# Patient Record
Sex: Female | Born: 1945 | Race: White | Hispanic: Yes | Smoking: Former smoker
Health system: Southern US, Community
[De-identification: ages and names within clinical notes are randomized; demographics above are authoritative.]

## PROBLEM LIST (undated history)

## (undated) DIAGNOSIS — K219 Gastro-esophageal reflux disease without esophagitis: Secondary | ICD-10-CM

## (undated) DIAGNOSIS — F419 Anxiety disorder, unspecified: Secondary | ICD-10-CM

## (undated) DIAGNOSIS — T7840XA Allergy, unspecified, initial encounter: Secondary | ICD-10-CM

## (undated) DIAGNOSIS — E785 Hyperlipidemia, unspecified: Secondary | ICD-10-CM

## (undated) DIAGNOSIS — M1990 Unspecified osteoarthritis, unspecified site: Secondary | ICD-10-CM

## (undated) DIAGNOSIS — R7303 Prediabetes: Secondary | ICD-10-CM

## (undated) DIAGNOSIS — F32A Depression, unspecified: Secondary | ICD-10-CM

## (undated) DIAGNOSIS — F329 Major depressive disorder, single episode, unspecified: Secondary | ICD-10-CM

## (undated) DIAGNOSIS — G4730 Sleep apnea, unspecified: Secondary | ICD-10-CM

## (undated) DIAGNOSIS — E119 Type 2 diabetes mellitus without complications: Secondary | ICD-10-CM

## (undated) DIAGNOSIS — H269 Unspecified cataract: Secondary | ICD-10-CM

## (undated) HISTORY — PX: FEMUR FRACTURE SURGERY: SHX633

## (undated) HISTORY — PX: DIAGNOSTIC LAPAROSCOPY: SUR761

## (undated) HISTORY — PX: LIPOMA EXCISION: SHX5283

## (undated) HISTORY — DX: Type 2 diabetes mellitus without complications: E11.9

## (undated) HISTORY — PX: JOINT REPLACEMENT: SHX530

## (undated) HISTORY — PX: FRACTURE SURGERY: SHX138

## (undated) HISTORY — PX: EYE SURGERY: SHX253

## (undated) HISTORY — DX: Unspecified cataract: H26.9

## (undated) HISTORY — DX: Prediabetes: R73.03

## (undated) HISTORY — PX: UMBILICAL HERNIA REPAIR: SUR1181

## (undated) HISTORY — PX: KNEE ARTHROPLASTY: SHX992

## (undated) HISTORY — DX: Unspecified osteoarthritis, unspecified site: M19.90

## (undated) HISTORY — PX: HERNIA REPAIR: SHX51

## (undated) HISTORY — DX: Allergy, unspecified, initial encounter: T78.40XA

## (undated) HISTORY — PX: TOTAL HIP ARTHROPLASTY: SHX124

## (undated) HISTORY — PX: CATARACT EXTRACTION, BILATERAL: SHX1313

---
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
## 2014-03-25 DIAGNOSIS — F320 Major depressive disorder, single episode, mild: Secondary | ICD-10-CM | POA: Diagnosis not present

## 2014-03-25 DIAGNOSIS — E780 Pure hypercholesterolemia: Secondary | ICD-10-CM | POA: Diagnosis not present

## 2014-03-25 DIAGNOSIS — R7301 Impaired fasting glucose: Secondary | ICD-10-CM | POA: Diagnosis not present

## 2014-03-25 DIAGNOSIS — G4733 Obstructive sleep apnea (adult) (pediatric): Secondary | ICD-10-CM | POA: Diagnosis not present

## 2014-05-17 DIAGNOSIS — H43813 Vitreous degeneration, bilateral: Secondary | ICD-10-CM | POA: Diagnosis not present

## 2014-05-17 DIAGNOSIS — H5201 Hypermetropia, right eye: Secondary | ICD-10-CM | POA: Diagnosis not present

## 2014-06-28 DIAGNOSIS — F320 Major depressive disorder, single episode, mild: Secondary | ICD-10-CM | POA: Diagnosis not present

## 2014-06-28 DIAGNOSIS — G4733 Obstructive sleep apnea (adult) (pediatric): Secondary | ICD-10-CM | POA: Diagnosis not present

## 2014-06-28 DIAGNOSIS — E780 Pure hypercholesterolemia: Secondary | ICD-10-CM | POA: Diagnosis not present

## 2014-06-28 DIAGNOSIS — R7301 Impaired fasting glucose: Secondary | ICD-10-CM | POA: Diagnosis not present

## 2014-10-07 DIAGNOSIS — R7301 Impaired fasting glucose: Secondary | ICD-10-CM | POA: Diagnosis not present

## 2014-10-07 DIAGNOSIS — G4733 Obstructive sleep apnea (adult) (pediatric): Secondary | ICD-10-CM | POA: Diagnosis not present

## 2014-10-07 DIAGNOSIS — E663 Overweight: Secondary | ICD-10-CM | POA: Diagnosis not present

## 2014-10-07 DIAGNOSIS — F320 Major depressive disorder, single episode, mild: Secondary | ICD-10-CM | POA: Diagnosis not present

## 2014-10-07 DIAGNOSIS — E780 Pure hypercholesterolemia: Secondary | ICD-10-CM | POA: Diagnosis not present

## 2015-01-07 DIAGNOSIS — Z1239 Encounter for other screening for malignant neoplasm of breast: Secondary | ICD-10-CM | POA: Diagnosis not present

## 2015-01-07 DIAGNOSIS — G4733 Obstructive sleep apnea (adult) (pediatric): Secondary | ICD-10-CM | POA: Diagnosis not present

## 2015-01-07 DIAGNOSIS — R7301 Impaired fasting glucose: Secondary | ICD-10-CM | POA: Diagnosis not present

## 2015-01-07 DIAGNOSIS — E782 Mixed hyperlipidemia: Secondary | ICD-10-CM | POA: Diagnosis not present

## 2015-01-07 DIAGNOSIS — F320 Major depressive disorder, single episode, mild: Secondary | ICD-10-CM | POA: Diagnosis not present

## 2015-02-04 DIAGNOSIS — G4733 Obstructive sleep apnea (adult) (pediatric): Secondary | ICD-10-CM | POA: Diagnosis not present

## 2015-04-11 DIAGNOSIS — I10 Essential (primary) hypertension: Secondary | ICD-10-CM | POA: Diagnosis not present

## 2015-04-11 DIAGNOSIS — R7301 Impaired fasting glucose: Secondary | ICD-10-CM | POA: Diagnosis not present

## 2015-04-11 DIAGNOSIS — E782 Mixed hyperlipidemia: Secondary | ICD-10-CM | POA: Diagnosis not present

## 2015-04-11 DIAGNOSIS — G4733 Obstructive sleep apnea (adult) (pediatric): Secondary | ICD-10-CM | POA: Diagnosis not present

## 2015-05-11 DIAGNOSIS — G4733 Obstructive sleep apnea (adult) (pediatric): Secondary | ICD-10-CM | POA: Diagnosis not present

## 2015-05-24 DIAGNOSIS — L03032 Cellulitis of left toe: Secondary | ICD-10-CM | POA: Diagnosis not present

## 2015-07-11 DIAGNOSIS — R7301 Impaired fasting glucose: Secondary | ICD-10-CM | POA: Diagnosis not present

## 2015-07-11 DIAGNOSIS — R202 Paresthesia of skin: Secondary | ICD-10-CM | POA: Diagnosis not present

## 2015-07-11 DIAGNOSIS — G4733 Obstructive sleep apnea (adult) (pediatric): Secondary | ICD-10-CM | POA: Diagnosis not present

## 2015-07-11 DIAGNOSIS — E782 Mixed hyperlipidemia: Secondary | ICD-10-CM | POA: Diagnosis not present

## 2015-08-29 DIAGNOSIS — Z0001 Encounter for general adult medical examination with abnormal findings: Secondary | ICD-10-CM | POA: Diagnosis not present

## 2015-08-29 DIAGNOSIS — Z1211 Encounter for screening for malignant neoplasm of colon: Secondary | ICD-10-CM | POA: Diagnosis not present

## 2015-08-29 DIAGNOSIS — Z1231 Encounter for screening mammogram for malignant neoplasm of breast: Secondary | ICD-10-CM | POA: Diagnosis not present

## 2015-08-29 DIAGNOSIS — Z13820 Encounter for screening for osteoporosis: Secondary | ICD-10-CM | POA: Diagnosis not present

## 2015-08-29 DIAGNOSIS — N3000 Acute cystitis without hematuria: Secondary | ICD-10-CM | POA: Diagnosis not present

## 2015-08-29 DIAGNOSIS — N3001 Acute cystitis with hematuria: Secondary | ICD-10-CM | POA: Diagnosis not present

## 2015-09-12 DIAGNOSIS — N3001 Acute cystitis with hematuria: Secondary | ICD-10-CM | POA: Diagnosis not present

## 2015-09-22 DIAGNOSIS — G4733 Obstructive sleep apnea (adult) (pediatric): Secondary | ICD-10-CM | POA: Diagnosis not present

## 2015-10-18 DIAGNOSIS — Z23 Encounter for immunization: Secondary | ICD-10-CM | POA: Diagnosis not present

## 2015-10-18 DIAGNOSIS — G4733 Obstructive sleep apnea (adult) (pediatric): Secondary | ICD-10-CM | POA: Diagnosis not present

## 2015-10-18 DIAGNOSIS — L700 Acne vulgaris: Secondary | ICD-10-CM | POA: Diagnosis not present

## 2015-10-18 DIAGNOSIS — E782 Mixed hyperlipidemia: Secondary | ICD-10-CM | POA: Diagnosis not present

## 2015-10-18 DIAGNOSIS — R7301 Impaired fasting glucose: Secondary | ICD-10-CM | POA: Diagnosis not present

## 2015-11-28 DIAGNOSIS — M85832 Other specified disorders of bone density and structure, left forearm: Secondary | ICD-10-CM | POA: Diagnosis not present

## 2015-11-28 DIAGNOSIS — Z1231 Encounter for screening mammogram for malignant neoplasm of breast: Secondary | ICD-10-CM | POA: Diagnosis not present

## 2015-11-28 DIAGNOSIS — Z13820 Encounter for screening for osteoporosis: Secondary | ICD-10-CM | POA: Diagnosis not present

## 2015-11-28 DIAGNOSIS — N959 Unspecified menopausal and perimenopausal disorder: Secondary | ICD-10-CM | POA: Diagnosis not present

## 2015-12-28 DIAGNOSIS — G4733 Obstructive sleep apnea (adult) (pediatric): Secondary | ICD-10-CM | POA: Diagnosis not present

## 2016-01-20 DIAGNOSIS — E782 Mixed hyperlipidemia: Secondary | ICD-10-CM | POA: Diagnosis not present

## 2016-01-20 DIAGNOSIS — G4733 Obstructive sleep apnea (adult) (pediatric): Secondary | ICD-10-CM | POA: Diagnosis not present

## 2016-01-20 DIAGNOSIS — R7301 Impaired fasting glucose: Secondary | ICD-10-CM | POA: Diagnosis not present

## 2016-04-03 DIAGNOSIS — G4733 Obstructive sleep apnea (adult) (pediatric): Secondary | ICD-10-CM | POA: Diagnosis not present

## 2016-04-25 DIAGNOSIS — R7301 Impaired fasting glucose: Secondary | ICD-10-CM | POA: Diagnosis not present

## 2016-04-25 DIAGNOSIS — G4733 Obstructive sleep apnea (adult) (pediatric): Secondary | ICD-10-CM | POA: Diagnosis not present

## 2016-04-25 DIAGNOSIS — E782 Mixed hyperlipidemia: Secondary | ICD-10-CM | POA: Diagnosis not present

## 2016-04-30 DIAGNOSIS — L0202 Furuncle of face: Secondary | ICD-10-CM | POA: Diagnosis not present

## 2016-05-09 DIAGNOSIS — L710 Perioral dermatitis: Secondary | ICD-10-CM | POA: Diagnosis not present

## 2016-06-19 DIAGNOSIS — L710 Perioral dermatitis: Secondary | ICD-10-CM | POA: Diagnosis not present

## 2016-07-12 DIAGNOSIS — G4733 Obstructive sleep apnea (adult) (pediatric): Secondary | ICD-10-CM | POA: Diagnosis not present

## 2016-07-26 DIAGNOSIS — R7301 Impaired fasting glucose: Secondary | ICD-10-CM | POA: Diagnosis not present

## 2016-07-26 DIAGNOSIS — G4733 Obstructive sleep apnea (adult) (pediatric): Secondary | ICD-10-CM | POA: Diagnosis not present

## 2016-07-26 DIAGNOSIS — E782 Mixed hyperlipidemia: Secondary | ICD-10-CM | POA: Diagnosis not present

## 2016-09-04 DIAGNOSIS — R590 Localized enlarged lymph nodes: Secondary | ICD-10-CM | POA: Diagnosis not present

## 2016-09-04 DIAGNOSIS — Z0001 Encounter for general adult medical examination with abnormal findings: Secondary | ICD-10-CM | POA: Diagnosis not present

## 2016-10-18 DIAGNOSIS — G4733 Obstructive sleep apnea (adult) (pediatric): Secondary | ICD-10-CM | POA: Diagnosis not present

## 2017-01-11 DIAGNOSIS — E782 Mixed hyperlipidemia: Secondary | ICD-10-CM | POA: Diagnosis not present

## 2017-01-11 DIAGNOSIS — R7301 Impaired fasting glucose: Secondary | ICD-10-CM | POA: Diagnosis not present

## 2017-01-11 DIAGNOSIS — G4733 Obstructive sleep apnea (adult) (pediatric): Secondary | ICD-10-CM | POA: Diagnosis not present

## 2017-01-11 DIAGNOSIS — Z23 Encounter for immunization: Secondary | ICD-10-CM | POA: Diagnosis not present

## 2017-01-22 DIAGNOSIS — G4733 Obstructive sleep apnea (adult) (pediatric): Secondary | ICD-10-CM | POA: Diagnosis not present

## 2017-04-16 DIAGNOSIS — E782 Mixed hyperlipidemia: Secondary | ICD-10-CM | POA: Diagnosis not present

## 2017-04-16 DIAGNOSIS — G4733 Obstructive sleep apnea (adult) (pediatric): Secondary | ICD-10-CM | POA: Diagnosis not present

## 2017-04-16 DIAGNOSIS — R7301 Impaired fasting glucose: Secondary | ICD-10-CM | POA: Diagnosis not present

## 2017-05-02 DIAGNOSIS — G4733 Obstructive sleep apnea (adult) (pediatric): Secondary | ICD-10-CM | POA: Diagnosis not present

## 2017-07-19 DIAGNOSIS — R7301 Impaired fasting glucose: Secondary | ICD-10-CM | POA: Diagnosis not present

## 2017-07-19 DIAGNOSIS — G4733 Obstructive sleep apnea (adult) (pediatric): Secondary | ICD-10-CM | POA: Diagnosis not present

## 2017-07-19 DIAGNOSIS — M25541 Pain in joints of right hand: Secondary | ICD-10-CM | POA: Diagnosis not present

## 2017-07-19 DIAGNOSIS — M25542 Pain in joints of left hand: Secondary | ICD-10-CM | POA: Diagnosis not present

## 2017-07-19 DIAGNOSIS — E782 Mixed hyperlipidemia: Secondary | ICD-10-CM | POA: Diagnosis not present

## 2017-07-31 DIAGNOSIS — G4733 Obstructive sleep apnea (adult) (pediatric): Secondary | ICD-10-CM | POA: Diagnosis not present

## 2017-10-29 DIAGNOSIS — G4733 Obstructive sleep apnea (adult) (pediatric): Secondary | ICD-10-CM | POA: Diagnosis not present

## 2017-11-01 DIAGNOSIS — R7301 Impaired fasting glucose: Secondary | ICD-10-CM | POA: Diagnosis not present

## 2017-11-01 DIAGNOSIS — G4733 Obstructive sleep apnea (adult) (pediatric): Secondary | ICD-10-CM | POA: Diagnosis not present

## 2017-11-01 DIAGNOSIS — E782 Mixed hyperlipidemia: Secondary | ICD-10-CM | POA: Diagnosis not present

## 2017-11-18 DIAGNOSIS — N958 Other specified menopausal and perimenopausal disorders: Secondary | ICD-10-CM | POA: Diagnosis not present

## 2017-11-18 DIAGNOSIS — Z1231 Encounter for screening mammogram for malignant neoplasm of breast: Secondary | ICD-10-CM | POA: Diagnosis not present

## 2017-11-18 DIAGNOSIS — Z0000 Encounter for general adult medical examination without abnormal findings: Secondary | ICD-10-CM | POA: Diagnosis not present

## 2017-11-28 DIAGNOSIS — G4733 Obstructive sleep apnea (adult) (pediatric): Secondary | ICD-10-CM | POA: Diagnosis not present

## 2017-12-30 DIAGNOSIS — G4733 Obstructive sleep apnea (adult) (pediatric): Secondary | ICD-10-CM | POA: Diagnosis not present

## 2018-01-31 DIAGNOSIS — G4733 Obstructive sleep apnea (adult) (pediatric): Secondary | ICD-10-CM | POA: Diagnosis not present

## 2018-02-06 DIAGNOSIS — R7301 Impaired fasting glucose: Secondary | ICD-10-CM | POA: Diagnosis not present

## 2018-02-06 DIAGNOSIS — G4733 Obstructive sleep apnea (adult) (pediatric): Secondary | ICD-10-CM | POA: Diagnosis not present

## 2018-02-06 DIAGNOSIS — L650 Telogen effluvium: Secondary | ICD-10-CM | POA: Diagnosis not present

## 2018-02-06 DIAGNOSIS — E782 Mixed hyperlipidemia: Secondary | ICD-10-CM | POA: Diagnosis not present

## 2018-02-06 DIAGNOSIS — L304 Erythema intertrigo: Secondary | ICD-10-CM | POA: Diagnosis not present

## 2018-03-11 DIAGNOSIS — L03011 Cellulitis of right finger: Secondary | ICD-10-CM | POA: Diagnosis not present

## 2018-04-02 DIAGNOSIS — G4733 Obstructive sleep apnea (adult) (pediatric): Secondary | ICD-10-CM | POA: Diagnosis not present

## 2018-05-02 DIAGNOSIS — G4733 Obstructive sleep apnea (adult) (pediatric): Secondary | ICD-10-CM | POA: Diagnosis not present

## 2018-06-02 DIAGNOSIS — G4733 Obstructive sleep apnea (adult) (pediatric): Secondary | ICD-10-CM | POA: Diagnosis not present

## 2018-06-11 DIAGNOSIS — G4733 Obstructive sleep apnea (adult) (pediatric): Secondary | ICD-10-CM | POA: Diagnosis not present

## 2018-06-11 DIAGNOSIS — E782 Mixed hyperlipidemia: Secondary | ICD-10-CM | POA: Diagnosis not present

## 2018-06-11 DIAGNOSIS — R7301 Impaired fasting glucose: Secondary | ICD-10-CM | POA: Diagnosis not present

## 2018-07-02 DIAGNOSIS — G4733 Obstructive sleep apnea (adult) (pediatric): Secondary | ICD-10-CM | POA: Diagnosis not present

## 2018-12-05 DIAGNOSIS — M25511 Pain in right shoulder: Secondary | ICD-10-CM | POA: Diagnosis not present

## 2018-12-05 DIAGNOSIS — S069X9A Unspecified intracranial injury with loss of consciousness of unspecified duration, initial encounter: Secondary | ICD-10-CM | POA: Diagnosis not present

## 2018-12-05 DIAGNOSIS — S199XXA Unspecified injury of neck, initial encounter: Secondary | ICD-10-CM | POA: Diagnosis not present

## 2018-12-05 DIAGNOSIS — S4991XA Unspecified injury of right shoulder and upper arm, initial encounter: Secondary | ICD-10-CM | POA: Diagnosis not present

## 2018-12-05 DIAGNOSIS — S01111A Laceration without foreign body of right eyelid and periocular area, initial encounter: Secondary | ICD-10-CM | POA: Diagnosis not present

## 2018-12-05 DIAGNOSIS — S0231XA Fracture of orbital floor, right side, initial encounter for closed fracture: Secondary | ICD-10-CM | POA: Diagnosis not present

## 2018-12-08 DIAGNOSIS — S0231XA Fracture of orbital floor, right side, initial encounter for closed fracture: Secondary | ICD-10-CM | POA: Diagnosis present

## 2018-12-09 DIAGNOSIS — S0231XA Fracture of orbital floor, right side, initial encounter for closed fracture: Secondary | ICD-10-CM | POA: Diagnosis not present

## 2018-12-11 DIAGNOSIS — H25041 Posterior subcapsular polar age-related cataract, right eye: Secondary | ICD-10-CM | POA: Diagnosis not present

## 2018-12-11 NOTE — H&P (Signed)
Subjective:     Patient ID: Connie Marks is a 73 y.o. female.  HPI  Referred from [facility] ED following fall DOI 10.30.20. Maxillofacial CT as below. Uses glasses for both near and far, states last eye exam over 2 years ago. Prior to injury felt she needed new Rx. Reports shadows and blurry vision since accident, not definite diplopia. No pain with eye movements. Reports pre injury astigmatism.  CLINICAL DATA: 73 year old female with history of trauma from a fall with injury to the right-side of the head with laceration around the right eye. Loss of consciousness.  EXAM: CT MAXILLOFACIAL WITHOUT CONTRAST  TECHNIQUE: Multidetector CT imaging of the head, cervical spine, and maxillofacial structures were performed using the standard protocol without intravenous contrast. Multiplanar CT image reconstructions of the cervical spine and maxillofacial structures were also generated.  COMPARISON: No priors.  CT MAXILLOFACIAL FINDINGS  Osseous: Displaced fracture of the floor of the right orbit with downward displacement of orbital fat and the inferior rectus muscle, without frank entrapment. Pterygoid plates are intact. Mandible is intact. Mandibular condyles are located bilaterally. No other acute displaced facial bone fractures are noted.  Orbits: Downward herniation of orbital fat and inferior rectus muscle into the superior aspect of the right maxillary sinus. Right globe appears grossly intact. Left orbit is normal in appearance.  Sinuses: High attenuation material lying dependently in the right maxillary sinus, compatible with hemosinus.  Soft tissues: Small amount of gas in the right frontal scalp where there is also some soft tissue swelling, suggesting a laceration. Small amount of high attenuation soft tissue swelling overlying the right maxilla, also suggestive of a contusion.  Other: None.  IMPRESSION: 1. Acute displaced fracture of the right orbital  floor with herniation of orbital fat and downward displacement of the inferior rectus muscle, without frank entrapment. There is also a small amount of hemosinus in the right maxillary sinus. 2. No acute displaced skull fracture or signs of significant acute intracranial trauma. 3. No evidence of significant acute traumatic injury to the cervical spine. 4. 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 5. Multilevel degenerative disc disease and cervical spondylosis, as above.   Electronically Signed By: Vinnie Langton M.D. 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  Musculoskeletal: Positive for arthralgias and myalgias.  Neurological: Positive for dizziness and light-headedness.  Psychiatric/Behavioral: The patient is nervous/anxious.    Remainder 12 point review negative    Objective:   Physical Exam  Constitutional: She is oriented to person, place, and time.  Cardiovascular: Normal rate, regular rhythm and normal heart sounds.  Pulmonary/Chest: Effort normal and breath sounds normal.  Neurological: She is alert and oriented to person, place, and time.  HEENT: right peri orbital and conjunctival hemorrhage, pupils 4 to 2 mm bilateral, EOMI Able to distract lower lid from globe> 4 mm With hand held Snellen chart, acuity with glasses OS 20/50 OD 20/400 Diminished sensation subjective over right V2 distribution    Assessment:     Closed orbital floor fracture right    Plan:     CT personally reviewed. Orbital floor blow out fracture without entrapment. Given size of defect recommend open treatment with implant to prevent late enophthalmos, diplopia. Her visual exam is poor- asked her to have complete ophthalmologic exam prior to surgery. She will arrange this herself ideally this week with surgery tentatively some time next week. Reviewed trans  conjunctival vs lower lid incision, permanent plate placement in floor. Reviewed risks bleeding, blindness, lower lid malposition, need for additional surgery. Reviewed use of Frost stitch, overnight hospital stay for visual cheeks/monitoring for bleeding.   Hold ASA.   Discussed risk COVID infectionthrough this elective surgery. Patient will receive COVID testing prior to surgery. Discussed even if patient receivesa negative test result, the tests in some cases may fail to detect the virus or patient maycontract COVID after the test.COVID 19 infectionbefore/during/aftersurgery may result in lead to a higher chance of complication and death.  Glenna Fellows, MD [facility] Plastic [facility] Re[facility] Surgery

## 2018-12-12 ENCOUNTER — Other Ambulatory Visit: Payer: Self-pay

## 2018-12-12 ENCOUNTER — Encounter (HOSPITAL_BASED_OUTPATIENT_CLINIC_OR_DEPARTMENT_OTHER): Payer: Self-pay

## 2018-12-16 ENCOUNTER — Other Ambulatory Visit (HOSPITAL_COMMUNITY)
Admission: RE | Admit: 2018-12-16 | Discharge: 2018-12-16 | Disposition: A | Discharge status: Home / Self Care | Payer: Medicare Other | Source: Ambulatory Visit | Attending: Plastic Surgery | Admitting: Plastic Surgery

## 2018-12-16 DIAGNOSIS — Z01812 Encounter for preprocedural laboratory examination: Secondary | ICD-10-CM | POA: Insufficient documentation

## 2018-12-16 DIAGNOSIS — Z20828 Contact with and (suspected) exposure to other viral communicable diseases: Secondary | ICD-10-CM | POA: Diagnosis not present

## 2018-12-18 LAB — NOVEL CORONAVIRUS, NAA (HOSP ORDER, SEND-OUT TO REF LAB; TAT 18-24 HRS): SARS-CoV-2, NAA: NOT DETECTED

## 2018-12-18 NOTE — Anesthesia Preprocedure Evaluation (Addendum)
Anesthesia Evaluation  Patient identified by MRN, date of birth, ID band Patient awake    Reviewed: Allergy & Precautions, NPO status , Patient's Chart, lab work & pertinent test results  History of Anesthesia Complications Negative for: history of anesthetic complications  Airway Mallampati: II  TM Distance: >3 FB Neck ROM: Full    Dental  (+) Dental Advisory Given, Teeth Intact   Pulmonary sleep apnea and Continuous Positive Airway Pressure Ventilation , former smoker,    Pulmonary exam normal        Cardiovascular negative cardio ROS Normal cardiovascular exam     Neuro/Psych PSYCHIATRIC DISORDERS Anxiety Depression negative neurological ROS     GI/Hepatic Neg liver ROS, GERD  Medicated and Controlled,  Endo/Other   Obesity   Renal/GU negative Renal ROS     Musculoskeletal negative musculoskeletal ROS (+)   Abdominal   Peds  Hematology negative hematology ROS (+)   Anesthesia Other Findings   Reproductive/Obstetrics                            Anesthesia Physical Anesthesia Plan  ASA: II  Anesthesia Plan: General   Post-op Pain Management:    Induction: Intravenous  PONV Risk Score and Plan: 4 or greater and Treatment may vary due to age or medical condition, Ondansetron and Dexamethasone  Airway Management Planned: Oral ETT  Additional Equipment: None  Intra-op Plan:   Post-operative Plan: Extubation in OR  Informed Consent: I have reviewed the patients History and Physical, chart, labs and discussed the procedure including the risks, benefits and alternatives for the proposed anesthesia with the patient or authorized representative who has indicated his/her understanding and acceptance.     Dental advisory given  Plan Discussed with: CRNA and Anesthesiologist  Anesthesia Plan Comments:        Anesthesia Quick Evaluation

## 2018-12-19 ENCOUNTER — Ambulatory Visit (HOSPITAL_BASED_OUTPATIENT_CLINIC_OR_DEPARTMENT_OTHER): Payer: Medicare Other | Admitting: Anesthesiology

## 2018-12-19 ENCOUNTER — Encounter (HOSPITAL_BASED_OUTPATIENT_CLINIC_OR_DEPARTMENT_OTHER): Payer: Self-pay | Admitting: *Deleted

## 2018-12-19 ENCOUNTER — Ambulatory Visit (HOSPITAL_BASED_OUTPATIENT_CLINIC_OR_DEPARTMENT_OTHER)
Admission: RE | Admit: 2018-12-19 | Discharge: 2018-12-20 | Disposition: A | Discharge status: Home / Self Care | Payer: Medicare Other | Attending: Plastic Surgery | Admitting: Plastic Surgery

## 2018-12-19 ENCOUNTER — Encounter (HOSPITAL_BASED_OUTPATIENT_CLINIC_OR_DEPARTMENT_OTHER)
Admission: RE | Disposition: A | Discharge status: Home / Self Care | Payer: Self-pay | Source: Home / Self Care | Attending: Plastic Surgery

## 2018-12-19 ENCOUNTER — Other Ambulatory Visit: Payer: Self-pay

## 2018-12-19 DIAGNOSIS — W19XXXA Unspecified fall, initial encounter: Secondary | ICD-10-CM | POA: Diagnosis not present

## 2018-12-19 DIAGNOSIS — E785 Hyperlipidemia, unspecified: Secondary | ICD-10-CM | POA: Diagnosis not present

## 2018-12-19 DIAGNOSIS — Z9989 Dependence on other enabling machines and devices: Secondary | ICD-10-CM | POA: Diagnosis not present

## 2018-12-19 DIAGNOSIS — K219 Gastro-esophageal reflux disease without esophagitis: Secondary | ICD-10-CM | POA: Diagnosis not present

## 2018-12-19 DIAGNOSIS — Z87891 Personal history of nicotine dependence: Secondary | ICD-10-CM | POA: Insufficient documentation

## 2018-12-19 DIAGNOSIS — G4730 Sleep apnea, unspecified: Secondary | ICD-10-CM | POA: Insufficient documentation

## 2018-12-19 DIAGNOSIS — Z6834 Body mass index (BMI) 34.0-34.9, adult: Secondary | ICD-10-CM | POA: Insufficient documentation

## 2018-12-19 DIAGNOSIS — E669 Obesity, unspecified: Secondary | ICD-10-CM | POA: Diagnosis not present

## 2018-12-19 DIAGNOSIS — H532 Diplopia: Secondary | ICD-10-CM | POA: Insufficient documentation

## 2018-12-19 DIAGNOSIS — S0231XA Fracture of orbital floor, right side, initial encounter for closed fracture: Secondary | ICD-10-CM | POA: Diagnosis not present

## 2018-12-19 HISTORY — PX: ORIF ORBITAL FRACTURE: SHX5312

## 2018-12-19 HISTORY — DX: Gastro-esophageal reflux disease without esophagitis: K21.9

## 2018-12-19 HISTORY — DX: Sleep apnea, unspecified: G47.30

## 2018-12-19 HISTORY — DX: Hyperlipidemia, unspecified: E78.5

## 2018-12-19 HISTORY — DX: Anxiety disorder, unspecified: F41.9

## 2018-12-19 HISTORY — DX: Depression, unspecified: F32.A

## 2018-12-19 SURGERY — OPEN REDUCTION INTERNAL FIXATION (ORIF) ORBITAL FRACTURE
Anesthesia: General | Site: Eye | Laterality: Right

## 2018-12-19 MED ORDER — CEFAZOLIN SODIUM-DEXTROSE 2-4 GM/100ML-% IV SOLN
INTRAVENOUS | Status: AC
  Filled 2018-12-19: qty 100

## 2018-12-19 MED ORDER — EPHEDRINE SULFATE-NACL 50-0.9 MG/10ML-% IV SOSY
PREFILLED_SYRINGE | INTRAVENOUS | Status: DC | PRN
  Administered 2018-12-19 (×2): 5 mg via INTRAVENOUS

## 2018-12-19 MED ORDER — OXYMETAZOLINE HCL 0.05 % NA SOLN
NASAL | Status: AC
  Filled 2018-12-19: qty 30

## 2018-12-19 MED ORDER — OXYCODONE HCL 5 MG/5ML PO SOLN: 5.0000 mg | Freq: Once | ORAL | Status: AC | PRN

## 2018-12-19 MED ORDER — ONDANSETRON HCL 4 MG/2ML IJ SOLN: 4.0000 mg | Freq: Four times a day (QID) | INTRAMUSCULAR | Status: DC | PRN

## 2018-12-19 MED ORDER — MIDAZOLAM HCL 5 MG/5ML IJ SOLN
INTRAMUSCULAR | Status: DC | PRN
  Administered 2018-12-19: 1 mg via INTRAVENOUS

## 2018-12-19 MED ORDER — TOBRAMYCIN-DEXAMETHASONE 0.3-0.1 % OP OINT
TOPICAL_OINTMENT | OPHTHALMIC | Status: DC | PRN
  Administered 2018-12-19: 1 via OPHTHALMIC

## 2018-12-19 MED ORDER — LIDOCAINE-EPINEPHRINE 1 %-1:100000 IJ SOLN
INTRAMUSCULAR | Status: AC
  Filled 2018-12-19: qty 1

## 2018-12-19 MED ORDER — ROCURONIUM BROMIDE 10 MG/ML (PF) SYRINGE
PREFILLED_SYRINGE | INTRAVENOUS | Status: AC
  Filled 2018-12-19: qty 10

## 2018-12-19 MED ORDER — FENTANYL CITRATE (PF) 100 MCG/2ML IJ SOLN
INTRAMUSCULAR | Status: AC
  Filled 2018-12-19: qty 2

## 2018-12-19 MED ORDER — ROCURONIUM BROMIDE 100 MG/10ML IV SOLN
INTRAVENOUS | Status: DC | PRN
  Administered 2018-12-19: 50 mg via INTRAVENOUS

## 2018-12-19 MED ORDER — PRAMIPEXOLE DIHYDROCHLORIDE 0.25 MG PO TABS
0.5000 mg | ORAL_TABLET | Freq: Three times a day (TID) | ORAL | Status: DC
  Administered 2018-12-19: 0.5 mg via ORAL
  Filled 2018-12-19 (×2): qty 2

## 2018-12-19 MED ORDER — KCL IN DEXTROSE-NACL 20-5-0.45 MEQ/L-%-% IV SOLN
INTRAVENOUS | Status: DC
  Administered 2018-12-19: 10:00:00 via INTRAVENOUS
  Filled 2018-12-19: qty 1000

## 2018-12-19 MED ORDER — PROPOFOL 10 MG/ML IV BOLUS
INTRAVENOUS | Status: AC
  Filled 2018-12-19: qty 20

## 2018-12-19 MED ORDER — LIDOCAINE 2% (20 MG/ML) 5 ML SYRINGE
INTRAMUSCULAR | Status: DC | PRN
  Administered 2018-12-19: 60 mg via INTRAVENOUS

## 2018-12-19 MED ORDER — MIDAZOLAM HCL 2 MG/2ML IJ SOLN: 1.0000 mg | INTRAMUSCULAR | Status: DC | PRN

## 2018-12-19 MED ORDER — LACTATED RINGERS IV SOLN
INTRAVENOUS | Status: DC
  Administered 2018-12-19: 07:00:00 via INTRAVENOUS

## 2018-12-19 MED ORDER — DEXAMETHASONE SODIUM PHOSPHATE 10 MG/ML IJ SOLN
INTRAMUSCULAR | Status: DC | PRN
  Administered 2018-12-19: 10 mg via INTRAVENOUS

## 2018-12-19 MED ORDER — PANTOPRAZOLE SODIUM 40 MG PO TBEC: 40.0000 mg | DELAYED_RELEASE_TABLET | Freq: Every day | ORAL | Status: DC

## 2018-12-19 MED ORDER — TOBRAMYCIN-DEXAMETHASONE 0.3-0.1 % OP OINT
TOPICAL_OINTMENT | Freq: Four times a day (QID) | OPHTHALMIC | Status: DC
  Administered 2018-12-19: 12:00:00 via OPHTHALMIC
  Administered 2018-12-20 (×2): 1 via OPHTHALMIC

## 2018-12-19 MED ORDER — OXYCODONE HCL 5 MG PO TABS
5.0000 mg | ORAL_TABLET | Freq: Once | ORAL | Status: AC | PRN
  Administered 2018-12-19: 5 mg via ORAL

## 2018-12-19 MED ORDER — MIDAZOLAM HCL 2 MG/2ML IJ SOLN
INTRAMUSCULAR | Status: AC
  Filled 2018-12-19: qty 2

## 2018-12-19 MED ORDER — OXYCODONE HCL 5 MG PO TABS
ORAL_TABLET | ORAL | Status: AC
  Filled 2018-12-19: qty 1

## 2018-12-19 MED ORDER — TOBRAMYCIN-DEXAMETHASONE 0.3-0.1 % OP OINT
TOPICAL_OINTMENT | OPHTHALMIC | Status: AC
  Filled 2018-12-19: qty 3.5

## 2018-12-19 MED ORDER — LIDOCAINE 2% (20 MG/ML) 5 ML SYRINGE
INTRAMUSCULAR | Status: AC
  Filled 2018-12-19: qty 5

## 2018-12-19 MED ORDER — FENTANYL CITRATE (PF) 100 MCG/2ML IJ SOLN: 50.0000 ug | INTRAMUSCULAR | Status: DC | PRN

## 2018-12-19 MED ORDER — SUGAMMADEX SODIUM 200 MG/2ML IV SOLN
INTRAVENOUS | Status: DC | PRN
  Administered 2018-12-19: 200 mg via INTRAVENOUS

## 2018-12-19 MED ORDER — PROPOFOL 10 MG/ML IV BOLUS
INTRAVENOUS | Status: DC | PRN
  Administered 2018-12-19: 140 mg via INTRAVENOUS

## 2018-12-19 MED ORDER — BSS IO SOLN
INTRAOCULAR | Status: DC | PRN
  Administered 2018-12-19: 2 mL

## 2018-12-19 MED ORDER — ONDANSETRON HCL 4 MG/2ML IJ SOLN: 4.0000 mg | Freq: Once | INTRAMUSCULAR | Status: DC | PRN

## 2018-12-19 MED ORDER — ONDANSETRON 4 MG PO TBDP: 4.0000 mg | ORAL_TABLET | Freq: Four times a day (QID) | ORAL | Status: DC | PRN

## 2018-12-19 MED ORDER — CEFAZOLIN SODIUM-DEXTROSE 2-4 GM/100ML-% IV SOLN
2.0000 g | INTRAVENOUS | Status: AC
  Administered 2018-12-19: 2 g via INTRAVENOUS

## 2018-12-19 MED ORDER — TRAMADOL HCL 50 MG PO TABS
50.0000 mg | ORAL_TABLET | Freq: Four times a day (QID) | ORAL | Status: DC | PRN
  Administered 2018-12-19 – 2018-12-20 (×3): 50 mg via ORAL
  Filled 2018-12-19 (×3): qty 1

## 2018-12-19 MED ORDER — HYDROMORPHONE HCL 1 MG/ML IJ SOLN
0.5000 mg | INTRAMUSCULAR | Status: DC | PRN
  Administered 2018-12-19: 0.5 mg via INTRAVENOUS
  Filled 2018-12-19 (×2): qty 0.5

## 2018-12-19 MED ORDER — FENTANYL CITRATE (PF) 100 MCG/2ML IJ SOLN: 25.0000 ug | INTRAMUSCULAR | Status: DC | PRN

## 2018-12-19 MED ORDER — ONDANSETRON HCL 4 MG/2ML IJ SOLN
INTRAMUSCULAR | Status: DC | PRN
  Administered 2018-12-19: 4 mg via INTRAVENOUS

## 2018-12-19 MED ORDER — LIDOCAINE-EPINEPHRINE 1 %-1:100000 IJ SOLN
INTRAMUSCULAR | Status: DC | PRN
  Administered 2018-12-19: 3 mL

## 2018-12-19 MED ORDER — TRAMADOL HCL 50 MG PO TABS: 50.0000 mg | ORAL_TABLET | Freq: Four times a day (QID) | ORAL | 0 refills | Status: AC | PRN

## 2018-12-19 MED ORDER — BSS IO SOLN
INTRAOCULAR | Status: AC
  Filled 2018-12-19: qty 15

## 2018-12-19 MED ORDER — FENTANYL CITRATE (PF) 100 MCG/2ML IJ SOLN
INTRAMUSCULAR | Status: DC | PRN
  Administered 2018-12-19 (×4): 50 ug via INTRAVENOUS

## 2018-12-19 MED ORDER — SIMVASTATIN 20 MG PO TABS: 20.0000 mg | ORAL_TABLET | Freq: Every day | ORAL | Status: DC

## 2018-12-19 SURGICAL SUPPLY — 48 items
APPLICATOR DR MATTHEWS STRL (MISCELLANEOUS) ×3 IMPLANT
BENZOIN TINCTURE PRP APPL 2/3 (GAUZE/BANDAGES/DRESSINGS) ×3 IMPLANT
CANISTER SUCT 1200ML W/VALVE (MISCELLANEOUS) IMPLANT
CLOSURE WOUND 1/2 X4 (GAUZE/BANDAGES/DRESSINGS) ×1
COVER WAND RF STERILE (DRAPES) IMPLANT
DECANTER SPIKE VIAL GLASS SM (MISCELLANEOUS) ×3 IMPLANT
DRAPE UTILITY XL STRL (DRAPES) IMPLANT
ELECT COATED BLADE 2.86 ST (ELECTRODE) IMPLANT
ELECT NEEDLE BLADE 2-5/6 (NEEDLE) ×3 IMPLANT
ELECT REM PT RETURN 9FT ADLT (ELECTROSURGICAL) ×3
ELECTRODE REM PT RTRN 9FT ADLT (ELECTROSURGICAL) ×1 IMPLANT
GAUZE PACKING FOLDED 2  STR (GAUZE/BANDAGES/DRESSINGS)
GAUZE PACKING FOLDED 2 STR (GAUZE/BANDAGES/DRESSINGS) IMPLANT
GLOVE BIO SURGEON STRL SZ 6 (GLOVE) ×6 IMPLANT
GLOVE BIOGEL PI IND STRL 7.0 (GLOVE) ×1 IMPLANT
GLOVE BIOGEL PI INDICATOR 7.0 (GLOVE) ×2
GLOVE ECLIPSE 6.5 STRL STRAW (GLOVE) ×3 IMPLANT
GOWN STRL REUS W/ TWL LRG LVL3 (GOWN DISPOSABLE) ×2 IMPLANT
GOWN STRL REUS W/TWL LRG LVL3 (GOWN DISPOSABLE) ×4
IMPL BARRIER ORB 38X50X1.0 (Mesh General) ×1 IMPLANT
IMPLANT BARRIER ORB 38X50X1.0 (Mesh General) ×3 IMPLANT
NEEDLE BLUNT 17GA (NEEDLE) IMPLANT
NEEDLE HYPO 30GX1 BEV (NEEDLE) IMPLANT
NEEDLE PRECISIONGLIDE 27X1.5 (NEEDLE) ×3 IMPLANT
NS IRRIG 1000ML POUR BTL (IV SOLUTION) ×3 IMPLANT
PACK BASIN DAY SURGERY FS (CUSTOM PROCEDURE TRAY) ×3 IMPLANT
PACK ENT DAY SURGERY (CUSTOM PROCEDURE TRAY) ×3 IMPLANT
PATTIES SURGICAL .5 X3 (DISPOSABLE) IMPLANT
PENCIL BUTTON HOLSTER BLD 10FT (ELECTRODE) ×3 IMPLANT
PENCIL SMOKE EVACUATOR (MISCELLANEOUS) IMPLANT
SCREW UPPERFACE 1.2X4M SLFDRIL (Screw) ×3 IMPLANT
SHEILD EYE MED CORNL SHD 22X21 (OPHTHALMIC RELATED) ×6
SHIELD EYE MED CORNL SHD 22X21 (OPHTHALMIC RELATED) ×2 IMPLANT
SLEEVE SCD COMPRESS KNEE MED (MISCELLANEOUS) ×3 IMPLANT
STAPLER VISISTAT 35W (STAPLE) ×3 IMPLANT
STRIP CLOSURE SKIN 1/2X4 (GAUZE/BANDAGES/DRESSINGS) ×2 IMPLANT
SUT MON AB 5-0 P3 18 (SUTURE) IMPLANT
SUT PROLENE 6 0 P 1 18 (SUTURE) IMPLANT
SUT SILK 4 0 P 3 (SUTURE) ×3 IMPLANT
SUT VIC AB 4-0 P-3 18XBRD (SUTURE) IMPLANT
SUT VIC AB 4-0 P3 18 (SUTURE)
SUT VIC AB 4-0 SH 27 (SUTURE)
SUT VIC AB 4-0 SH 27XANBCTRL (SUTURE) IMPLANT
SUT VIC AB 5-0 P-3 18X BRD (SUTURE) IMPLANT
SUT VIC AB 5-0 P3 18 (SUTURE)
SYR BULB 3OZ (MISCELLANEOUS) IMPLANT
TOWEL GREEN STERILE FF (TOWEL DISPOSABLE) ×6 IMPLANT
TRAY DSU PREP LF (CUSTOM PROCEDURE TRAY) ×3 IMPLANT

## 2018-12-19 NOTE — Op Note (Signed)
Operative Note   DATE OF OPERATION: 11.13.20  LOCATION: [facility]-observation  SURGICAL DIVISION: Plastic Surgery  PREOPERATIVE DIAGNOSES:  1. Closed right orbital floor fracture blowout  POSTOPERATIVE DIAGNOSES:  same  PROCEDURE:  Open treatment right orbital floor fracture periorbital approach with implant  SURGEON: Irene Limbo MD MBA  ASSISTANT: none  ANESTHESIA:  General.   EBL: 5 ml  COMPLICATIONS: None immediate.   INDICATIONS FOR PROCEDURE:  The patient, Connie Marks, is a 73 y.o. female born on November 15, 1945, is here for treatment right orbital floor blowout fracture without entrapment.   FINDINGS: Medpor Orbital Floor Barrier sheet implant placed. No entrapment on forced duction test noted following implant placement.  DESCRIPTION OF PROCEDURE:  The patient's operative site was marked with the patient in the preoperative area. The patientwas taken to the operating room. SCDs were placed and IV antibiotics were given. Steri strips applied over left eyelid. Ophthalmic lubricant and scleral shield placed in right eye.The patient's operative site was prepped and draped inusualfashion. A time out was performed and all information was confirmed to be correct.Local anesthetic infiltrated to perform right supraorbital and infraorbital nerve blocks. Additional local anesthetic infiltrated in conjunctiva. Desmarres retractor placed over lower eyelid and incision transconjunctival made onto infraorbital rim. Dissection completed along orbital floor to expose defect and bony edges of defect. Herniated orbital contents reduced from maxillary sinus. Cavity irrigated. Template of defect made and used to fashion Medpor orbital floor Barrier sheet. This was placed over defect. Scleral shield removed and forced duction test completed with no evidence entrapment. Implant secured to inferior orbital rim with 100mm screw. Scleral shield removed and eye irrigated with basic salt  solution. Tobradex ophthalmic placed in right eye. Frost stitch placed in lower eyelid lateral to lateral limbus and secured to forehead with steri strips. Sutures removed from right brow laceration repair.   The patient was allowed to wakefromanesthesia, extubatedand taken to the recovery room in satisfactory condition. Gross vision tested immediately upon arrival to PACU intact.  SPECIMENS: none  DRAINS: none  Irene Limbo, MD [facility]astic [facility] Reconstructive Surgery

## 2018-12-19 NOTE — Anesthesia Postprocedure Evaluation (Signed)
Anesthesia Post Note  Patient: Connie Marks  Procedure(s) Performed: OPEN TREATMENT RIGHT ORBITAL FLOOR WITH IMPLANT, PERIORBITAL APPROACH (Right Eye)     Patient location during evaluation: PACU Anesthesia Type: General Level of consciousness: awake and alert Pain management: pain level controlled Vital Signs Assessment: post-procedure vital signs reviewed and stable Respiratory status: spontaneous breathing, nonlabored ventilation and respiratory function stable Cardiovascular status: blood pressure returned to baseline and stable Postop Assessment: no apparent nausea or vomiting Anesthetic complications: no    Last Vitals:  Vitals:   12/19/18 0915 12/19/18 0952  BP: 140/73 (!) 153/81  Pulse: 72 77  Resp: 17 18  Temp:  37.9 C  SpO2: 99% 94%    Last Pain:  Vitals:   12/19/18 0952  TempSrc:   PainSc: Connie Marks

## 2018-12-19 NOTE — Interval H&P Note (Signed)
History and Physical Interval Note:  12/19/2018 6:54 AM  Connie Marks  has presented today for surgery, with the diagnosis of right orbital floor blowout fracture.  The various methods of treatment have been discussed with the patient and family. After consideration of risks, benefits and other options for treatment, the patient has consented to  Procedure(s): OPEN TREATMENT RIGHT ORBITAL FLOOR WITH IMPLANT, PERIORBITAL APPROACH (Right) as a surgical intervention.  The patient's history has been reviewed, patient examined, no change in status, stable for surgery.  I have reviewed the patient's chart and labs.  Questions were answered to the patient's satisfaction.     Arnoldo Hooker Connie Marks

## 2018-12-19 NOTE — Anesthesia Procedure Notes (Signed)
Procedure Name: Intubation Date/Time: 12/19/2018 7:28 AM Performed by: Gwyndolyn Saxon, CRNA Pre-anesthesia Checklist: Patient identified, Emergency Drugs available, Suction available and Patient being monitored Patient Re-evaluated:Patient Re-evaluated prior to induction Oxygen Delivery Method: Circle system utilized Preoxygenation: Pre-oxygenation with 100% oxygen Induction Type: IV induction Ventilation: Mask ventilation without difficulty Laryngoscope Size: Miller and 2 Grade View: Grade I Tube type: Oral Rae Tube size: 7.0 mm Number of attempts: 1 Placement Confirmation: ETT inserted through vocal cords under direct vision,  positive ETCO2 and breath sounds checked- equal and bilateral Tube secured with: Tape Dental Injury: Teeth and Oropharynx as per pre-operative assessment

## 2018-12-19 NOTE — Transfer of Care (Signed)
Immediate Anesthesia Transfer of Care Note  Patient: Connie Marks  Procedure(s) Performed: OPEN TREATMENT RIGHT ORBITAL FLOOR WITH IMPLANT, PERIORBITAL APPROACH (Right Eye)  Patient Location: PACU  Anesthesia Type:General  Level of Consciousness: awake, alert  and oriented  Airway & Oxygen Therapy: Patient Spontanous Breathing and Patient connected to face mask oxygen  Post-op Assessment: Report given to RN and Post -op Vital signs reviewed and stable  Post vital signs: Reviewed and stable  Last Vitals:  Vitals Value Taken Time  BP 141/90 12/19/18 0900  Temp    Pulse 75 12/19/18 0901  Resp 15 12/19/18 0901  SpO2 99 % 12/19/18 0901  Vitals shown include unvalidated device data.  Last Pain:  Vitals:   12/19/18 0654  TempSrc: Oral  PainSc: 0-No pain      Patients Stated Pain Goal: 3 (32/44/01 0272)  Complications: No apparent anesthesia complications

## 2018-12-20 DIAGNOSIS — S0231XA Fracture of orbital floor, right side, initial encounter for closed fracture: Secondary | ICD-10-CM | POA: Diagnosis not present

## 2018-12-20 DIAGNOSIS — Z87891 Personal history of nicotine dependence: Secondary | ICD-10-CM | POA: Diagnosis not present

## 2018-12-20 DIAGNOSIS — H532 Diplopia: Secondary | ICD-10-CM | POA: Diagnosis not present

## 2018-12-20 DIAGNOSIS — G4730 Sleep apnea, unspecified: Secondary | ICD-10-CM | POA: Diagnosis not present

## 2018-12-20 DIAGNOSIS — K219 Gastro-esophageal reflux disease without esophagitis: Secondary | ICD-10-CM | POA: Diagnosis not present

## 2018-12-20 NOTE — Discharge Summary (Signed)
Physician Discharge Summary  Patient ID: Connie Marks MRN: [id number] DOB/AGE: November 05, 1945 73 y.o.  Admit date: 12/19/2018 Discharge date: 12/20/2018  Admission Diagnoses: Right orbital floor blow out fracture  Discharge Diagnoses:  Active Problems:   Closed blow-out fracture of right orbital floor [facility])   Discharged Condition: stable  Hospital Course: Postoperatively patient had controlled pain, tolerating oral medication and tolerating diet. She reported on POD#1 diplopia in all direction gaze. States this was present preoperatively when looking up. Instructed on Tobradex placement and lower eyelid massage.  Treatments: open treatment right orbital floor fracture with implant 11.13.20  Discharge Exam: Blood pressure (!) 142/80, pulse 78, temperature 98.8 F (37.1 C), resp. rate 18, height 5\' 5"  (1.651 m), weight 92.7 kg, SpO2 98 %. Incision/Wound: EOMI with expected ecchymoses, frost stitch removed. Reports diplopia in all directions, no pain with eye movements, no nausea  Disposition: Discharge disposition: 01-Home or Self Care       Discharge Instructions    Call MD for:  redness, tenderness, or signs of infection (pain, swelling, bleeding, redness, odor or green/yellow discharge around incision site)   Complete by: As directed    Discharge instructions   Complete by: As directed    [place] to shower 11.14.20. Soap and water ok. No house work or yard work or exercise until cleared by MD. Recommend sleep on 2-3 pillows through follow up visit. Try to limit nose blowing through follow up visit.   Tobradex ophthalmic IN right eye nightly.  Ice packs for comfort. Ok to use ibuprofen or Tylenol as directed for pain.   Driving Restrictions   Complete by: As directed    No driving if taking prescription pain medication   Lifting restrictions   Complete by: As directed    No lifting > 5-10 lbs until cleared by MD   Resume previous diet   Complete by: As directed        Follow-up Information    Irene Limbo, MD In 1 week.   Specialty: Plastic Surgery Why: as scheduled Contact information: [address] [place] [address] [phone]           Signed: Irene Limbo 12/20/2018, 8:13 AM

## 2018-12-20 NOTE — Discharge Instructions (Signed)

## 2018-12-22 ENCOUNTER — Encounter (HOSPITAL_BASED_OUTPATIENT_CLINIC_OR_DEPARTMENT_OTHER): Payer: Self-pay | Admitting: Plastic Surgery

## 2019-01-14 ENCOUNTER — Other Ambulatory Visit: Payer: Self-pay | Admitting: Plastic Surgery

## 2019-01-14 DIAGNOSIS — S0231XA Fracture of orbital floor, right side, initial encounter for closed fracture: Secondary | ICD-10-CM

## 2019-01-16 DIAGNOSIS — G4733 Obstructive sleep apnea (adult) (pediatric): Secondary | ICD-10-CM | POA: Diagnosis not present

## 2019-01-16 DIAGNOSIS — E782 Mixed hyperlipidemia: Secondary | ICD-10-CM | POA: Diagnosis not present

## 2019-01-16 DIAGNOSIS — Z0001 Encounter for general adult medical examination with abnormal findings: Secondary | ICD-10-CM | POA: Diagnosis not present

## 2019-01-16 DIAGNOSIS — R7301 Impaired fasting glucose: Secondary | ICD-10-CM | POA: Diagnosis not present

## 2019-01-19 DIAGNOSIS — R7309 Other abnormal glucose: Secondary | ICD-10-CM | POA: Diagnosis not present

## 2019-01-21 DIAGNOSIS — S0231XA Fracture of orbital floor, right side, initial encounter for closed fracture: Secondary | ICD-10-CM | POA: Diagnosis not present

## 2019-01-21 DIAGNOSIS — H43813 Vitreous degeneration, bilateral: Secondary | ICD-10-CM | POA: Diagnosis not present

## 2019-01-21 DIAGNOSIS — H2512 Age-related nuclear cataract, left eye: Secondary | ICD-10-CM | POA: Diagnosis not present

## 2019-01-21 DIAGNOSIS — H5021 Vertical strabismus, right eye: Secondary | ICD-10-CM | POA: Diagnosis not present

## 2019-01-21 DIAGNOSIS — H26101 Unspecified traumatic cataract, right eye: Secondary | ICD-10-CM | POA: Diagnosis not present

## 2019-01-22 ENCOUNTER — Other Ambulatory Visit: Payer: Self-pay

## 2019-01-22 ENCOUNTER — Ambulatory Visit
Admission: RE | Admit: 2019-01-22 | Discharge: 2019-01-22 | Disposition: A | Discharge status: Home / Self Care | Payer: Medicare Other | Source: Ambulatory Visit | Attending: Plastic Surgery | Admitting: Plastic Surgery

## 2019-01-22 DIAGNOSIS — S0231XA Fracture of orbital floor, right side, initial encounter for closed fracture: Secondary | ICD-10-CM

## 2019-01-26 ENCOUNTER — Other Ambulatory Visit: Payer: Self-pay

## 2019-01-26 ENCOUNTER — Encounter (HOSPITAL_BASED_OUTPATIENT_CLINIC_OR_DEPARTMENT_OTHER): Payer: Self-pay | Admitting: Plastic Surgery

## 2019-01-26 NOTE — H&P (Signed)
  Subjective:     Patient ID: Connie Marks is a 73 y.o. female.  HPI  5.5 weeks post op. CT as below. Seen by Dr. Katy Fitch who recommended no cataract surgery until proptosis/eye position corrected. Continues to have diplopia.  DOI 10.30.20 fall at home  Uses glasses for both near and far. Prior to injury felt she needed new Rx. Reports pre injury astigmatism. Seen by optometrist pre operatively and found to have bilateral cataracts, recommended ophthalmology f/u.  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  COMPARISON: 12/05/2018  FINDINGS: Orbits: Comminuted fracture of the right orbital floor is again identified with depression into the maxillary sinus. There has been orbital floor reconstruction with a non metallic implant and a single screw identified anteriorly. The barrier implant does not extend into the fracture defect and the inferior rectus runs above this. Soft tissue density is present between the implant and depressed fracture fragments, which may reflect postoperative change or residual hematoma.  Suspected osseous fragment within the inferior orbit adjacent to the inferior rectus (series 6, image 29).  There is persistent mild right proptosis. Left orbit is unremarkable.  Limited intracranial imaging demonstrates no acute abnormality.  Visualized sinuses: Lobular maxillary sinus mucosal thickening. Persistent opacification of a posterior right ethmoid air cell.  Soft tissues: Resolution of right periorbital and facial soft tissue swelling  Limited intracranial: No acute abnormality.  IMPRESSION: Interval right orbital floor reconstruction with reduction of herniated orbital contents. There is nonspecific soft  tissue density between the implant and depressed fracture fragments, which may reflect postoperative change or residual hematoma.  Possible small osseous fragment within the inferior orbit adjacent to the inferior rectus.  Mild right proptosis.   Electronically Signed By: Macy Mis M.D. On: 01/22/2019 13:17    Review of Systems     Objective:   Physical Exam  Cardiovascular: Normal rate, regular rhythm and normal heart sounds.  Pulmonary/Chest: Effort normal and breath sounds normal.    HEENT:  EOMI, no pain with movement, lower lid positioned 2 mm below lower limbus, pupils 4 to 2 bilateral No lagophthalmos proptosis on right  Assessment:     Closed orbital floor fracture right s/p open treatment with implant.    Plan:       CT reviewed. Clinically proptosis and superior displacement globe. Recommend revision orbital floor plate placement. Reviewed observation stay, risks continued diplopia, blindness, lower lid malposition, bleeding. Plan similar trans conjunctival approach.

## 2019-01-27 NOTE — Progress Notes (Signed)

## 2019-01-31 ENCOUNTER — Other Ambulatory Visit (HOSPITAL_COMMUNITY)
Admission: RE | Admit: 2019-01-31 | Discharge: 2019-01-31 | Disposition: A | Discharge status: Home / Self Care | Payer: Medicare Other | Source: Ambulatory Visit | Attending: Plastic Surgery | Admitting: Plastic Surgery

## 2019-01-31 DIAGNOSIS — Z20828 Contact with and (suspected) exposure to other viral communicable diseases: Secondary | ICD-10-CM | POA: Insufficient documentation

## 2019-01-31 DIAGNOSIS — Z01812 Encounter for preprocedural laboratory examination: Secondary | ICD-10-CM | POA: Insufficient documentation

## 2019-02-01 LAB — SARS CORONAVIRUS 2 (TAT 6-24 HRS): SARS Coronavirus 2: NEGATIVE

## 2019-02-03 ENCOUNTER — Ambulatory Visit (HOSPITAL_BASED_OUTPATIENT_CLINIC_OR_DEPARTMENT_OTHER)
Admission: RE | Admit: 2019-02-03 | Discharge: 2019-02-03 | Disposition: A | Discharge status: Home / Self Care | Payer: Medicare Other | Attending: Plastic Surgery | Admitting: Plastic Surgery

## 2019-02-03 ENCOUNTER — Other Ambulatory Visit: Payer: Self-pay

## 2019-02-03 ENCOUNTER — Encounter (HOSPITAL_BASED_OUTPATIENT_CLINIC_OR_DEPARTMENT_OTHER)
Admission: RE | Disposition: A | Discharge status: Home / Self Care | Payer: Self-pay | Source: Home / Self Care | Attending: Plastic Surgery

## 2019-02-03 ENCOUNTER — Ambulatory Visit (HOSPITAL_BASED_OUTPATIENT_CLINIC_OR_DEPARTMENT_OTHER): Payer: Medicare Other | Admitting: Anesthesiology

## 2019-02-03 ENCOUNTER — Encounter (HOSPITAL_BASED_OUTPATIENT_CLINIC_OR_DEPARTMENT_OTHER): Payer: Self-pay | Admitting: Plastic Surgery

## 2019-02-03 DIAGNOSIS — H532 Diplopia: Secondary | ICD-10-CM | POA: Insufficient documentation

## 2019-02-03 DIAGNOSIS — E669 Obesity, unspecified: Secondary | ICD-10-CM | POA: Diagnosis not present

## 2019-02-03 DIAGNOSIS — F419 Anxiety disorder, unspecified: Secondary | ICD-10-CM | POA: Insufficient documentation

## 2019-02-03 DIAGNOSIS — K219 Gastro-esophageal reflux disease without esophagitis: Secondary | ICD-10-CM | POA: Insufficient documentation

## 2019-02-03 DIAGNOSIS — W19XXXA Unspecified fall, initial encounter: Secondary | ICD-10-CM | POA: Diagnosis not present

## 2019-02-03 DIAGNOSIS — F329 Major depressive disorder, single episode, unspecified: Secondary | ICD-10-CM | POA: Insufficient documentation

## 2019-02-03 DIAGNOSIS — Z79899 Other long term (current) drug therapy: Secondary | ICD-10-CM | POA: Insufficient documentation

## 2019-02-03 DIAGNOSIS — Z888 Allergy status to other drugs, medicaments and biological substances status: Secondary | ICD-10-CM | POA: Insufficient documentation

## 2019-02-03 DIAGNOSIS — Z6834 Body mass index (BMI) 34.0-34.9, adult: Secondary | ICD-10-CM | POA: Insufficient documentation

## 2019-02-03 DIAGNOSIS — H0520 Unspecified exophthalmos: Secondary | ICD-10-CM | POA: Diagnosis not present

## 2019-02-03 DIAGNOSIS — G4730 Sleep apnea, unspecified: Secondary | ICD-10-CM | POA: Diagnosis not present

## 2019-02-03 DIAGNOSIS — Z9989 Dependence on other enabling machines and devices: Secondary | ICD-10-CM | POA: Diagnosis not present

## 2019-02-03 DIAGNOSIS — Z87891 Personal history of nicotine dependence: Secondary | ICD-10-CM | POA: Diagnosis not present

## 2019-02-03 DIAGNOSIS — S0231XA Fracture of orbital floor, right side, initial encounter for closed fracture: Secondary | ICD-10-CM | POA: Insufficient documentation

## 2019-02-03 HISTORY — PX: ORIF ORBITAL FRACTURE: SHX5312

## 2019-02-03 SURGERY — OPEN REDUCTION INTERNAL FIXATION (ORIF) ORBITAL FRACTURE
Anesthesia: General | Site: Eye | Laterality: Right

## 2019-02-03 MED ORDER — ONDANSETRON HCL 4 MG/2ML IJ SOLN: 4.0000 mg | Freq: Four times a day (QID) | INTRAMUSCULAR | Status: DC | PRN

## 2019-02-03 MED ORDER — PANTOPRAZOLE SODIUM 40 MG PO TBEC: 40.0000 mg | DELAYED_RELEASE_TABLET | Freq: Every day | ORAL | Status: DC

## 2019-02-03 MED ORDER — KCL IN DEXTROSE-NACL 20-5-0.45 MEQ/L-%-% IV SOLN
INTRAVENOUS | Status: DC
  Filled 2019-02-03: qty 1000

## 2019-02-03 MED ORDER — LACTATED RINGERS IV SOLN: INTRAVENOUS | Status: DC

## 2019-02-03 MED ORDER — SUCCINYLCHOLINE CHLORIDE 200 MG/10ML IV SOSY
PREFILLED_SYRINGE | INTRAVENOUS | Status: DC | PRN
  Administered 2019-02-03: 120 mg via INTRAVENOUS

## 2019-02-03 MED ORDER — SIMVASTATIN 20 MG PO TABS: 20.0000 mg | ORAL_TABLET | Freq: Every day | ORAL | Status: DC

## 2019-02-03 MED ORDER — TOBRAMYCIN-DEXAMETHASONE 0.3-0.1 % OP OINT: TOPICAL_OINTMENT | OPHTHALMIC | Status: DC

## 2019-02-03 MED ORDER — BSS IO SOLN
INTRAOCULAR | Status: AC
  Filled 2019-02-03: qty 15

## 2019-02-03 MED ORDER — LIDOCAINE-EPINEPHRINE 1 %-1:100000 IJ SOLN
INTRAMUSCULAR | Status: DC | PRN
  Administered 2019-02-03: 3 mL

## 2019-02-03 MED ORDER — OXYCODONE HCL 5 MG/5ML PO SOLN: 5.0000 mg | Freq: Once | ORAL | Status: DC | PRN

## 2019-02-03 MED ORDER — CITALOPRAM HYDROBROMIDE 40 MG PO TABS: 40.0000 mg | ORAL_TABLET | ORAL | Status: DC

## 2019-02-03 MED ORDER — EPHEDRINE SULFATE 50 MG/ML IJ SOLN
INTRAMUSCULAR | Status: DC | PRN
  Administered 2019-02-03 (×2): 10 mg via INTRAVENOUS

## 2019-02-03 MED ORDER — PRAMIPEXOLE DIHYDROCHLORIDE 0.25 MG PO TABS: 0.5000 mg | ORAL_TABLET | Freq: Three times a day (TID) | ORAL | Status: DC

## 2019-02-03 MED ORDER — ARTIFICIAL TEARS OPHTHALMIC OINT
TOPICAL_OINTMENT | OPHTHALMIC | Status: AC
  Filled 2019-02-03: qty 3.5

## 2019-02-03 MED ORDER — TOBRAMYCIN-DEXAMETHASONE 0.3-0.1 % OP OINT
TOPICAL_OINTMENT | OPHTHALMIC | Status: AC
  Filled 2019-02-03: qty 3.5

## 2019-02-03 MED ORDER — FENTANYL CITRATE (PF) 100 MCG/2ML IJ SOLN
INTRAMUSCULAR | Status: AC
  Filled 2019-02-03: qty 2

## 2019-02-03 MED ORDER — LIDOCAINE HCL (CARDIAC) PF 100 MG/5ML IV SOSY
PREFILLED_SYRINGE | INTRAVENOUS | Status: DC | PRN
  Administered 2019-02-03: 60 mg via INTRAVENOUS

## 2019-02-03 MED ORDER — TOBRAMYCIN-DEXAMETHASONE 0.3-0.1 % OP OINT
TOPICAL_OINTMENT | OPHTHALMIC | Status: DC | PRN
  Administered 2019-02-03: 1 via OPHTHALMIC

## 2019-02-03 MED ORDER — CEFAZOLIN SODIUM-DEXTROSE 2-4 GM/100ML-% IV SOLN
INTRAVENOUS | Status: AC
  Filled 2019-02-03: qty 100

## 2019-02-03 MED ORDER — DEXAMETHASONE SODIUM PHOSPHATE 10 MG/ML IJ SOLN
INTRAMUSCULAR | Status: DC | PRN
  Administered 2019-02-03: 6 mg via INTRAVENOUS

## 2019-02-03 MED ORDER — ONDANSETRON HCL 4 MG/2ML IJ SOLN
INTRAMUSCULAR | Status: AC
  Filled 2019-02-03: qty 2

## 2019-02-03 MED ORDER — DEXAMETHASONE SODIUM PHOSPHATE 10 MG/ML IJ SOLN
INTRAMUSCULAR | Status: AC
  Filled 2019-02-03: qty 1

## 2019-02-03 MED ORDER — ROCURONIUM BROMIDE 10 MG/ML (PF) SYRINGE
PREFILLED_SYRINGE | INTRAVENOUS | Status: AC
  Filled 2019-02-03: qty 10

## 2019-02-03 MED ORDER — TRAMADOL HCL 50 MG PO TABS: 50.0000 mg | ORAL_TABLET | Freq: Four times a day (QID) | ORAL | Status: DC | PRN

## 2019-02-03 MED ORDER — FENTANYL CITRATE (PF) 100 MCG/2ML IJ SOLN
INTRAMUSCULAR | Status: DC | PRN
  Administered 2019-02-03 (×2): 50 ug via INTRAVENOUS
  Administered 2019-02-03: 100 ug via INTRAVENOUS

## 2019-02-03 MED ORDER — SUGAMMADEX SODIUM 500 MG/5ML IV SOLN
INTRAVENOUS | Status: AC
  Filled 2019-02-03: qty 5

## 2019-02-03 MED ORDER — SUCCINYLCHOLINE CHLORIDE 200 MG/10ML IV SOSY
PREFILLED_SYRINGE | INTRAVENOUS | Status: AC
  Filled 2019-02-03: qty 10

## 2019-02-03 MED ORDER — EPHEDRINE 5 MG/ML INJ
INTRAVENOUS | Status: AC
  Filled 2019-02-03: qty 10

## 2019-02-03 MED ORDER — ONDANSETRON 4 MG PO TBDP: 4.0000 mg | ORAL_TABLET | Freq: Four times a day (QID) | ORAL | Status: DC | PRN

## 2019-02-03 MED ORDER — OXYCODONE HCL 5 MG PO TABS: 5.0000 mg | ORAL_TABLET | Freq: Once | ORAL | Status: DC | PRN

## 2019-02-03 MED ORDER — CEFAZOLIN SODIUM-DEXTROSE 2-4 GM/100ML-% IV SOLN
2.0000 g | INTRAVENOUS | Status: AC
  Administered 2019-02-03: 2 g via INTRAVENOUS

## 2019-02-03 MED ORDER — ROCURONIUM BROMIDE 100 MG/10ML IV SOLN
INTRAVENOUS | Status: DC | PRN
  Administered 2019-02-03: 55 mg via INTRAVENOUS
  Administered 2019-02-03: 20 mg via INTRAVENOUS
  Administered 2019-02-03: 5 mg via INTRAVENOUS

## 2019-02-03 MED ORDER — PROMETHAZINE HCL 25 MG/ML IJ SOLN: 6.2500 mg | INTRAMUSCULAR | Status: DC | PRN

## 2019-02-03 MED ORDER — ONDANSETRON HCL 4 MG/2ML IJ SOLN
INTRAMUSCULAR | Status: DC | PRN
  Administered 2019-02-03: 4 mg via INTRAVENOUS

## 2019-02-03 MED ORDER — SUGAMMADEX SODIUM 500 MG/5ML IV SOLN
INTRAVENOUS | Status: DC | PRN
  Administered 2019-02-03: 300 mg via INTRAVENOUS

## 2019-02-03 MED ORDER — HYDROMORPHONE HCL 1 MG/ML IJ SOLN: 0.2500 mg | INTRAMUSCULAR | Status: DC | PRN

## 2019-02-03 MED ORDER — PROPOFOL 10 MG/ML IV BOLUS
INTRAVENOUS | Status: DC | PRN
  Administered 2019-02-03: 150 mg via INTRAVENOUS

## 2019-02-03 MED ORDER — PROPOFOL 10 MG/ML IV BOLUS
INTRAVENOUS | Status: AC
  Filled 2019-02-03: qty 20

## 2019-02-03 MED ORDER — HYDROMORPHONE HCL 1 MG/ML IJ SOLN: 0.5000 mg | INTRAMUSCULAR | Status: DC | PRN

## 2019-02-03 SURGICAL SUPPLY — 42 items
APPLICATOR DR MATTHEWS STRL (MISCELLANEOUS) IMPLANT
BENZOIN TINCTURE PRP APPL 2/3 (GAUZE/BANDAGES/DRESSINGS) ×3 IMPLANT
CANISTER SUCT 1200ML W/VALVE (MISCELLANEOUS) ×3 IMPLANT
CLOSURE WOUND 1/2 X4 (GAUZE/BANDAGES/DRESSINGS) ×1
COVER WAND RF STERILE (DRAPES) IMPLANT
DECANTER SPIKE VIAL GLASS SM (MISCELLANEOUS) ×3 IMPLANT
ELECT COATED BLADE 2.86 ST (ELECTRODE) IMPLANT
ELECT NEEDLE BLADE 2-5/6 (NEEDLE) ×3 IMPLANT
ELECT REM PT RETURN 9FT ADLT (ELECTROSURGICAL) ×3
ELECTRODE REM PT RTRN 9FT ADLT (ELECTROSURGICAL) ×1 IMPLANT
GAUZE PACKING FOLDED 2  STR (GAUZE/BANDAGES/DRESSINGS)
GAUZE PACKING FOLDED 2 STR (GAUZE/BANDAGES/DRESSINGS) IMPLANT
GLOVE BIO SURGEON STRL SZ 6 (GLOVE) ×6 IMPLANT
GOWN STRL REUS W/ TWL LRG LVL3 (GOWN DISPOSABLE) ×2 IMPLANT
GOWN STRL REUS W/TWL LRG LVL3 (GOWN DISPOSABLE) ×4
IMPL BARRIER ORB 38X50X1.0 (Mesh General) ×1 IMPLANT
IMPLANT BARRIER ORB 38X50X1.0 (Mesh General) ×3 IMPLANT
NEEDLE BLUNT 17GA (NEEDLE) IMPLANT
NEEDLE HYPO 30GX1 BEV (NEEDLE) ×3 IMPLANT
NEEDLE PRECISIONGLIDE 27X1.5 (NEEDLE) ×3 IMPLANT
NS IRRIG 1000ML POUR BTL (IV SOLUTION) ×3 IMPLANT
PACK BASIN DAY SURGERY FS (CUSTOM PROCEDURE TRAY) ×3 IMPLANT
PACK ENT DAY SURGERY (CUSTOM PROCEDURE TRAY) ×3 IMPLANT
PATTIES SURGICAL .5 X3 (DISPOSABLE) IMPLANT
PENCIL SMOKE EVACUATOR (MISCELLANEOUS) ×3 IMPLANT
SHEILD EYE MED CORNL SHD 22X21 (OPHTHALMIC RELATED) ×3
SHIELD EYE MED CORNL SHD 22X21 (OPHTHALMIC RELATED) ×1 IMPLANT
SLEEVE SCD COMPRESS KNEE MED (MISCELLANEOUS) ×3 IMPLANT
STAPLER VISISTAT 35W (STAPLE) ×3 IMPLANT
STRIP CLOSURE SKIN 1/2X4 (GAUZE/BANDAGES/DRESSINGS) ×2 IMPLANT
SUT MON AB 5-0 P3 18 (SUTURE) IMPLANT
SUT PROLENE 6 0 P 1 18 (SUTURE) IMPLANT
SUT SILK 4 0 P 3 (SUTURE) ×3 IMPLANT
SUT VIC AB 4-0 P-3 18XBRD (SUTURE) IMPLANT
SUT VIC AB 4-0 P3 18 (SUTURE)
SUT VIC AB 4-0 SH 27 (SUTURE)
SUT VIC AB 4-0 SH 27XANBCTRL (SUTURE) IMPLANT
SUT VIC AB 5-0 P-3 18X BRD (SUTURE) IMPLANT
SUT VIC AB 5-0 P3 18 (SUTURE)
SYR BULB 3OZ (MISCELLANEOUS) ×3 IMPLANT
TOWEL GREEN STERILE FF (TOWEL DISPOSABLE) ×3 IMPLANT
TRAY DSU PREP LF (CUSTOM PROCEDURE TRAY) ×3 IMPLANT

## 2019-02-03 NOTE — Anesthesia Postprocedure Evaluation (Signed)
Anesthesia Post Note  Patient: Connie Marks  Procedure(s) Performed: OPEN TREATMENT RIGHT ORBITAL FLOOR FRACTURE REVISION, PERI ORBITAL APPROACH (Right Eye)     Patient location during evaluation: PACU Anesthesia Type: General Level of consciousness: awake and alert Pain management: pain level controlled Vital Signs Assessment: post-procedure vital signs reviewed and stable Respiratory status: spontaneous breathing, nonlabored ventilation and respiratory function stable Cardiovascular status: blood pressure returned to baseline and stable Postop Assessment: no apparent nausea or vomiting Anesthetic complications: no    Last Vitals:  Vitals:   02/03/19 1215 02/03/19 1241  BP: (!) 108/93 139/78  Pulse: 80 79  Resp: (!) 28 18  Temp:  37.5 C  SpO2: 96% 98%    Last Pain:  Vitals:   02/03/19 1241  TempSrc:   PainSc: 1                  Lynda Rainwater

## 2019-02-03 NOTE — Anesthesia Procedure Notes (Signed)
Procedure Name: Intubation Date/Time: 02/03/2019 10:32 AM Performed by: Jonna Munro, CRNA Pre-anesthesia Checklist: Patient identified, Emergency Drugs available, Suction available, Patient being monitored and Timeout performed Patient Re-evaluated:Patient Re-evaluated prior to induction Oxygen Delivery Method: Circle system utilized Preoxygenation: Pre-oxygenation with 100% oxygen Induction Type: IV induction, Rapid sequence and Cricoid Pressure applied Laryngoscope Size: Mac and 3 Grade View: Grade I Tube type: Oral Rae Tube size: 7.0 mm Number of attempts: 1 Placement Confirmation: ETT inserted through vocal cords under direct vision,  positive ETCO2 and breath sounds checked- equal and bilateral Secured at: 22 cm Tube secured with: Tape Dental Injury: Teeth and Oropharynx as per pre-operative assessment

## 2019-02-03 NOTE — Anesthesia Preprocedure Evaluation (Signed)
Anesthesia Evaluation  Patient identified by MRN, date of birth, ID band Patient awake    Reviewed: Allergy & Precautions, NPO status , Patient's Chart, lab work & pertinent test results  History of Anesthesia Complications Negative for: history of anesthetic complications  Airway Mallampati: II  TM Distance: >3 FB Neck ROM: Full    Dental no notable dental hx. (+) Dental Advisory Given, Teeth Intact   Pulmonary sleep apnea and Continuous Positive Airway Pressure Ventilation , former smoker,    Pulmonary exam normal breath sounds clear to auscultation       Cardiovascular negative cardio ROS Normal cardiovascular exam Rhythm:Regular Rate:Normal     Neuro/Psych PSYCHIATRIC DISORDERS Anxiety Depression negative neurological ROS     GI/Hepatic Neg liver ROS, GERD  Medicated and Controlled,  Endo/Other   Obesity   Renal/GU negative Renal ROS     Musculoskeletal negative musculoskeletal ROS (+)   Abdominal (+) + obese,   Peds  Hematology negative hematology ROS (+)   Anesthesia Other Findings   Reproductive/Obstetrics                             Anesthesia Physical  Anesthesia Plan  ASA: II  Anesthesia Plan: General   Post-op Pain Management:    Induction: Intravenous  PONV Risk Score and Plan: 3 and Treatment may vary due to age or medical condition, Ondansetron, Dexamethasone and Midazolam  Airway Management Planned: Oral ETT  Additional Equipment: None  Intra-op Plan:   Post-operative Plan: Extubation in OR  Informed Consent: I have reviewed the patients History and Physical, chart, labs and discussed the procedure including the risks, benefits and alternatives for the proposed anesthesia with the patient or authorized representative who has indicated his/her understanding and acceptance.     Dental advisory given  Plan Discussed with: CRNA and  Anesthesiologist  Anesthesia Plan Comments:         Anesthesia Quick Evaluation

## 2019-02-03 NOTE — Discharge Instructions (Signed)

## 2019-02-03 NOTE — Op Note (Signed)
Operative Note   DATE OF OPERATION: 12.29. 20  LOCATION: [facility]-observation  SURGICAL DIVISION: Plastic Surgery  PREOPERATIVE DIAGNOSES:  1. Closed right orbital floor fracture blowout  POSTOPERATIVE DIAGNOSES:  same  PROCEDURE:  Revision ppen treatment right orbital floor fracture periorbital approach with implant  SURGEON: Irene Limbo MD MBA  ASSISTANT: none  ANESTHESIA:  General.   EBL: 10 ml  COMPLICATIONS: None immediate.   INDICATIONS FOR PROCEDURE:  The patient, Connie Marks, is a 73 y.o. female born on [birth year], is here 6 weeks following open treatment right orbital floor blowout fracture with implant with continued diplopia, proptosis and superior displacement globe. Plan revision of orbital floor placement.   FINDINGS: Medpor Orbital Floor Barrier sheet implant placed. No entrapment on forced duction test noted following implant placement.  DESCRIPTION OF PROCEDURE:  The patient's operative site was marked with the patient in the preoperative area. The patientwas taken to the operating room. SCDs were placed and IV antibiotics were given. Steri strips applied over left eyelid. Ophthalmic lubricant and scleral shield placed in right eye.The patient's operative site was prepped and draped inusualfashion. A time out was performed and all information was confirmed to be correct.Local anesthetic infiltrated to perform right supraorbital and infraorbital nerve blocks. Additional local anesthetic infiltrated in conjunctiva. Desmarres retractor placed over lower eyelid and incision transconjunctival made onto infraorbital rim in prior conjunctival scar. Previously placed Medpor plate removed including one screw. CT scan prior to revision surgery noted possible bony fragment within orbit superior to implant- this was not encountered during dissection. The orbital floor defect was delineated. A new Medpor orbital floor Barrier sheet was trimmed to defect. This  was placed over defect. Scleral shield removed and forced duction test completed with no evidence entrapment. Implant secured to inferior orbital rim with 43mm screw. New implant was both less wide and shorter in length compared with original implant. Scleral shield removed and eye irrigated with basic salt solution. Tobradex ophthalmic placed in right eye. Frost stitch placed in lower eyelid lateral to lateral limbus and secured to forehead with steri strips.  The patient was allowed to wakefromanesthesia, extubatedand taken to the recovery room in satisfactory condition. Gross vision tested immediately upon arrival to PACU intact.  SPECIMENS: none  DRAINS: none  Irene Limbo, MD [facility] Plastic & Reconstructive Surgery

## 2019-02-03 NOTE — Discharge Summary (Signed)
Physician Discharge Summary  Patient ID: Connie Marks MRN: [id number] DOB/AGE: 73-19-1947 73 y.o.  Admit date: 02/03/2019 Discharge date: 02/03/2019  Admission Diagnoses: Closed blow out fracture right orbital floor  Discharge Diagnoses:  Active Problems:   Closed blow-out fracture of right orbital floor [facility])   Discharged Condition: stable  Hospital Course: Patient underwent revisionary surgery orbital floor implant placement. Post operatively noted significant improvement in diplopia. Pain controlled and tolerated diet. Monitored with vision checks over POD#0 and found suitable for discharge.  Treatments: surgery: revision right orbital floor repair with implant 12.29.20  Discharge Exam: Blood pressure 139/78, pulse 81, temperature 99.5 F (37.5 C), resp. rate 18, height 5\' 5"  (1.651 m), weight 93.3 kg, SpO2 96 %. Incision/Wound: Frost stitch in place, gross vision intact, EOMI  Disposition: Discharge disposition: 01-Home or Self Care       Discharge Instructions    Call MD for:  redness, tenderness, or signs of infection (pain, swelling, bleeding, redness, odor or green/yellow discharge around incision site)   Complete by: As directed    Discharge instructions   Complete by: As directed    Ok to shower am 12.30.20, pat steris dry.  Tobradex ophthalmic in right eye q 4-6 hours while awake- please send patient with tube  Patient has pain medication at home.  Sleep with head elevated on 2-3 pillows, no exercise house or yard work through follow up visit   Driving Restrictions   Complete by: As directed    No driving with Frost stitch in place, no driving if taking prescription pain medication   Lifting restrictions   Complete by: As directed    No lifting > 5 lbs through 1 week follow up visit   Resume previous diet   Complete by: As directed      Allergies as of 02/03/2019      Reactions   Adhesive [tape]    Red whelps       Medication List    TAKE  these medications   CALCIUM 500+D PO Take by mouth.   CELEXA PO Take 40 mg by mouth 1 day or 1 dose.   MULTIVITAL PO Take by mouth.   pramipexole 0.5 MG tablet Commonly known as: MIRAPEX Take 0.5 mg by mouth 3 (three) times daily.   PRILOSEC PO Take 20 mg by mouth every morning.   simvastatin 20 MG tablet Commonly known as: ZOCOR Take 20 mg by mouth daily.   traMADol 50 MG tablet Commonly known as: Ultram Take 1 tablet (50 mg total) by mouth every 6 (six) hours as needed.   WELLBUTRIN PO Take 150 mg by mouth 2 (two) times daily.      Follow-up Information    Irene Limbo, MD Follow up in 1 week(s).   Specialty: Plastic Surgery Why: as scheduled, also plan visit 12.31.20 for [facility] stitch removal Contact information: [address] [place] [address] [phone]           Signed: Irene Limbo 02/03/2019, 5:18 PM

## 2019-02-03 NOTE — Interval H&P Note (Signed)
History and Physical Interval Note:  02/03/2019 9:49 AM  [name]  has presented today for surgery, with the diagnosis of CLOSED ORBITAL FLOOR FRACTURE RIGHT SIDE.  The various methods of treatment have been discussed with the patient and family. After consideration of risks, benefits and other options for treatment, the patient has consented to  revision repair right orbital floor fracture as a surgical intervention.  The patient's history has been reviewed, patient examined, no change in status, stable for surgery.  I have reviewed the patient's chart and labs.  Questions were answered to the patient's satisfaction.     Arnoldo Hooker Markes Shatswell

## 2019-02-03 NOTE — Transfer of Care (Signed)
Immediate Anesthesia Transfer of Care Note  Patient: Connie Marks  Procedure(s) Performed: OPEN TREATMENT RIGHT ORBITAL FLOOR FRACTURE REVISION, PERI ORBITAL APPROACH (Right Eye)  Patient Location: PACU  Anesthesia Type:General  Level of Consciousness: awake, alert , oriented and patient cooperative  Airway & Oxygen Therapy: Patient Spontanous Breathing and Patient connected to face mask oxygen  Post-op Assessment: Report given to RN, Post -op Vital signs reviewed and stable and Patient moving all extremities X 4  Post vital signs: Reviewed and stable  Last Vitals:  Vitals Value Taken Time  BP    Temp    Pulse    Resp    SpO2      Last Pain:  Vitals:   02/03/19 0852  TempSrc: Tympanic  PainSc: 0-No pain         Complications: No apparent anesthesia complications

## 2019-03-20 DIAGNOSIS — Z1231 Encounter for screening mammogram for malignant neoplasm of breast: Secondary | ICD-10-CM | POA: Diagnosis not present

## 2019-03-20 DIAGNOSIS — H43813 Vitreous degeneration, bilateral: Secondary | ICD-10-CM | POA: Diagnosis not present

## 2019-03-20 DIAGNOSIS — S0231XA Fracture of orbital floor, right side, initial encounter for closed fracture: Secondary | ICD-10-CM | POA: Diagnosis not present

## 2019-03-20 DIAGNOSIS — H26101 Unspecified traumatic cataract, right eye: Secondary | ICD-10-CM | POA: Diagnosis not present

## 2019-03-20 DIAGNOSIS — N959 Unspecified menopausal and perimenopausal disorder: Secondary | ICD-10-CM | POA: Diagnosis not present

## 2019-03-20 DIAGNOSIS — H2512 Age-related nuclear cataract, left eye: Secondary | ICD-10-CM | POA: Diagnosis not present

## 2019-03-25 ENCOUNTER — Encounter: Payer: Self-pay | Admitting: Family Medicine

## 2019-04-03 ENCOUNTER — Encounter: Payer: Self-pay | Admitting: Nurse Practitioner

## 2019-04-03 ENCOUNTER — Ambulatory Visit (INDEPENDENT_AMBULATORY_CARE_PROVIDER_SITE_OTHER): Payer: Medicare Other | Admitting: Nurse Practitioner

## 2019-04-03 ENCOUNTER — Other Ambulatory Visit: Payer: Self-pay

## 2019-04-03 VITALS — BP 120/84 | HR 88 | Temp 97.3°F | Ht 65.0 in | Wt 216.8 lb

## 2019-04-03 DIAGNOSIS — S90112A Contusion of left great toe without damage to nail, initial encounter: Secondary | ICD-10-CM | POA: Diagnosis not present

## 2019-04-03 NOTE — Patient Instructions (Addendum)
  Voltaren over the counter for patients left great toe, apply ice, stabilize joint, follow up as needed    Contusion A contusion is a deep bruise. This is a result of an injury that causes bleeding under the skin. Symptoms of bruising include pain, swelling, and discolored skin. The skin may turn blue, purple, or yellow. Follow these instructions at home: Managing pain, stiffness, and swelling You may use RICE. This stands for:  Resting.  Icing.  Compression, or putting pressure.  Elevating, or raising the injured area. To follow this method, do these actions:  Rest the injured area.  If told, put ice on the injured area. ? Put ice in a plastic bag. ? Place a towel between your skin and the bag. ? Leave the ice on for 20 minutes, 2-3 times per day.  If told, put light pressure (compression) on the injured area using an elastic bandage. Make sure the bandage is not too tight. If the area tingles or becomes numb, remove it and put it back on as told by your doctor.  If possible, raise (elevate) the injured area above the level of your heart while you are sitting or lying down.  General instructions  Take over-the-counter and prescription medicines only as told by your doctor.  Keep all follow-up visits as told by your doctor. This is important. Contact a doctor if:  Your symptoms do not get better after several days of treatment.  Your symptoms get worse.  You have trouble moving the injured area. Get help right away if:  You have very bad pain.  You have a loss of feeling (numbness) in a hand or foot.  Your hand or foot turns pale or cold. Summary  A contusion is a deep bruise. This is a result of an injury that causes bleeding under the skin.  Symptoms of bruising include pain, swelling, and discolored skin. The skin may turn blue, purple, or yellow.  This condition is treated with rest, ice, compression, and elevation. This is also called RICE. You may be given  over-the-counter medicines for pain.  Contact a doctor if you do not feel better, or you feel worse. Get help right away if you have very bad pain, have lost feeling in a hand or foot, or the area turns pale or cold. This information is not intended to replace advice given to you by your health care provider. Make sure you discuss any questions you have with your health care provider. Document Revised: 09/13/2017 Document Reviewed: 09/13/2017 Elsevier Patient Education  2020 [employer].

## 2019-04-03 NOTE — Progress Notes (Signed)
Acute Office Visit  Subjective:    Patient ID: Connie Marks, female    DOB: 09-17-1945, 74 y.o.   MRN: [id number]  Chief Complaint  Patient presents with  . Toe Pain   Patient is a 74 years old female. She is here for left toe pain.The patient's medications were reviewed and reconciled since the patient's last visit. History details were provided by the patient. The history appears to be reliable.        Toe Pain  The incident occurred 5 to 7 days ago. The incident occurred at home. The injury mechanism was a fall. The pain is present in the left toes. The quality of the pain is described as aching. The pain is at a severity of 3/10. The pain is mild. The pain has been intermittent since onset. She reports no foreign bodies present. The symptoms are aggravated by palpation. She has tried ice and acetaminophen for the symptoms. The treatment provided mild relief.    OBJECTIVE: Vital signs as noted above. Appearance: alert, well appearing, and in no distress. Foot exam: completed.  ASSESSMENT:  Toe: Red, slightly swollen, petal palse present. Pain is not radiating. Patient's metatarsophalangeal joint is stable and flexible   PLAN: Rest the injured area as much as practical, Stabilize, apply ice, pain medication as needed  Past Medical History:  Diagnosis Date  . Anxiety   . Depression   . GERD (gastroesophageal reflux disease)   . Hyperlipidemia   . Sleep apnea    uses CPAP every night    Past Surgical History:  Procedure Laterality Date  . CESAREAN SECTION    . DIAGNOSTIC LAPAROSCOPY    . HERNIA REPAIR    . JOINT REPLACEMENT     knee replacements bilat  . JOINT REPLACEMENT     hip replacements bilat  . ORIF ORBITAL FRACTURE Right 12/19/2018   Procedure: OPEN TREATMENT RIGHT ORBITAL FLOOR WITH IMPLANT, PERIORBITAL APPROACH;  Surgeon: Irene Limbo, MD;  Location: [place];  Service: Plastics;  Laterality: Right;  . ORIF ORBITAL FRACTURE  Right 02/03/2019   Procedure: OPEN TREATMENT RIGHT ORBITAL FLOOR FRACTURE REVISION, PERI ORBITAL APPROACH;  Surgeon: Irene Limbo, MD;  Location: [place];  Service: Plastics;  Laterality: Right;    History reviewed. No pertinent family history.  Social History   Socioeconomic History  . Marital status: Divorced    Spouse name: Not on file  . Number of children: Not on file  . Years of education: Not on file  . Highest education level: Not on file  Occupational History  . Not on file  Tobacco Use  . Smoking status: Former Smoker    Quit date: 12/18/1985    Years since quitting: 33.3  . Smokeless tobacco: Never Used  Substance and Sexual Activity  . Alcohol use: Yes    Alcohol/week: 3.0 standard drinks    Types: 2 Glasses of wine, 1 Shots of liquor per week    Comment: 2 glasses /week  . Drug use: Never  . Sexual activity: Not on file  Other Topics Concern  . Not on file  Social History Narrative  . Not on file   Social Determinants of Health   Financial Resource Strain:   . Difficulty of Paying Living Expenses: Not on file  Food Insecurity:   . Worried About Charity fundraiser in the Last Year: Not on file  . Ran Out of Food in the Last Year: Not on file  Transportation Needs:   . Film/video editor (Medical): Not on file  . Lack of Transportation (Non-Medical): Not on file  Physical Activity:   . Days of Exercise per Week: Not on file  . Minutes of Exercise per Session: Not on file  Stress:   . Feeling of Stress : Not on file  Social Connections:   . Frequency of Communication with Friends and Family: Not on file  . Frequency of Social Gatherings with Friends and Family: Not on file  . Attends Religious Services: Not on file  . Active Member of Clubs or Organizations: Not on file  . Attends Archivist Meetings: Not on file  . Marital Status: Not on file  Intimate Partner Violence:   . Fear of Current or Ex-Partner: Not on  file  . Emotionally Abused: Not on file  . Physically Abused: Not on file  . Sexually Abused: Not on file    Outpatient Medications Prior to Visit  Medication Sig Dispense Refill  . BESIVANCE 0.6 % SUSP     . buPROPion (WELLBUTRIN SR) 150 MG 12 hr tablet tablet    . citalopram (CELEXA) 40 MG tablet tablet    . LOTEMAX 0.5 % ophthalmic suspension     . Omeprazole (PRILOSEC PO) Take 20 mg by mouth every morning.     . pramipexole (MIRAPEX) 0.5 MG tablet Take 0.5 mg by mouth 3 (three) times daily.    Marland Kitchen PROLENSA 0.07 % SOLN Place 1 drop into the right eye daily.    . simvastatin (ZOCOR) 20 MG tablet Take 20 mg by mouth daily.    Marland Kitchen buPROPion HCl (WELLBUTRIN PO) Take 150 mg by mouth 2 (two) times daily.     . Citalopram Hydrobromide (CELEXA PO) Take 40 mg by mouth 1 day or 1 dose.     . Multiple Vitamins-Minerals (MULTIVITAL PO) Take by mouth.    . traMADol (ULTRAM) 50 MG tablet Take 1 tablet (50 mg total) by mouth every 6 (six) hours as needed. 20 tablet 0  . Calcium Carb-Cholecalciferol (CALCIUM 500+D PO) Take by mouth.     No facility-administered medications prior to visit.    Allergies  Allergen Reactions  . Adhesive [Tape]     Red whelps     Review of Systems  Constitutional: Negative for activity change, appetite change and fatigue.  HENT: Negative for congestion.   Eyes: Negative for discharge and itching.  Respiratory: Negative for cough and chest tightness.   Cardiovascular: Negative for chest pain.  Gastrointestinal: Negative for abdominal distention.  Genitourinary: Negative for difficulty urinating.  Musculoskeletal: Positive for joint swelling.  Skin: Positive for color change. Negative for rash.  Neurological: Negative for weakness.  Psychiatric/Behavioral: The patient is not nervous/anxious.          Objective:    Physical Exam Constitutional:      Appearance: Normal appearance.  HENT:     Head: Normocephalic.     Right Ear: Tympanic membrane and ear  canal normal.     Left Ear: Tympanic membrane and ear canal normal.     Nose: Nose normal.     Mouth/Throat:     Mouth: Mucous membranes are moist.  Eyes:     Pupils: Pupils are equal, round, and reactive to light.  Cardiovascular:     Rate and Rhythm: Normal rate.     Pulses: Normal pulses.  Pulmonary:     Effort: Pulmonary effort is normal.     Breath sounds: Normal  breath sounds.  Abdominal:     General: Bowel sounds are normal.  Musculoskeletal:        General: Tenderness present.     Cervical back: Normal range of motion and neck supple.  Skin:    Findings: Erythema present.  Neurological:     Mental Status: She is alert.     Motor: No weakness.  Psychiatric:        Mood and Affect: Mood normal.         BP 120/84   Pulse 88   Temp (!) 97.3 F (36.3 C)   Ht '5\' 5"'  (1.651 m)   Wt 216 lb 12.8 oz (98.3 kg)   SpO2 96%   BMI 36.08 kg/m  Wt Readings from Last 3 Encounters:  04/03/19 216 lb 12.8 oz (98.3 kg)  02/03/19 205 lb 11 oz (93.3 kg)  12/19/18 204 lb 5.9 oz (92.7 kg)    Health Maintenance Due  Topic Date Due  . Hepatitis C Screening  12-24-1945  . TETANUS/TDAP  10/04/1964  . COLONOSCOPY  10/05/1995  . PNA vac Low Risk Adult (1 of 2 - PCV13) 10/05/2010  . INFLUENZA VACCINE  09/06/2018    There are no preventive care reminders to display for this patient.   No results found for: TSH No results found for: WBC, HGB, HCT, MCV, PLT No results found for: NA, K, CHLORIDE, CO2, GLUCOSE, BUN, CREATININE, BILITOT, ALKPHOS, AST, ALT, PROT, ALBUMIN, CALCIUM, ANIONGAP, EGFR, GFR No results found for: CHOL No results found for: HDL No results found for: LDLCALC No results found for: TRIG No results found for: CHOLHDL No results found for: HGBA1C     Assessment & Plan:   Problem List Items Addressed This Visit      Other   Contusion of left great toe without damage to nail - Primary    Recommendation: Apply Ice, topical Voltaren, stabilize joint, pain  medication as needed. Follow up with worsening symptoms.          No orders of the defined types were placed in this encounter.    Ivy Lynn, NP

## 2019-04-03 NOTE — Assessment & Plan Note (Signed)
Recommendation: Apply Ice, topical Voltaren, stabilize joint, pain medication as needed. Follow up with worsening symptoms.

## 2019-04-16 DIAGNOSIS — H268 Other specified cataract: Secondary | ICD-10-CM | POA: Diagnosis not present

## 2019-04-16 DIAGNOSIS — H25811 Combined forms of age-related cataract, right eye: Secondary | ICD-10-CM | POA: Diagnosis not present

## 2019-06-11 DIAGNOSIS — H5022 Vertical strabismus, left eye: Secondary | ICD-10-CM | POA: Diagnosis not present

## 2019-07-16 ENCOUNTER — Encounter: Payer: Self-pay | Admitting: Family Medicine

## 2019-07-16 DIAGNOSIS — E785 Hyperlipidemia, unspecified: Secondary | ICD-10-CM | POA: Insufficient documentation

## 2019-07-16 DIAGNOSIS — K219 Gastro-esophageal reflux disease without esophagitis: Secondary | ICD-10-CM | POA: Insufficient documentation

## 2019-07-16 NOTE — Progress Notes (Signed)
Subjective:  Patient ID: NEFERTARI REBMAN, female    DOB: Sep 26, 1945  Age: 74 y.o. MRN: [id number]  Chief Complaint  Patient presents with  . Hyperlipidemia  . Prediabetes  . Depression    HPI  Pt presents with hyperlipidemia.  Current treatment includes Zocor.  Compliance with treatment has been good; she takes her medication as directed, maintains her low cholesterol diet, follows up as directed, and maintains her exercise regimen.  She denies experiencing any hypercholesterolemia related symptoms.      Additionally, she presents with history of major depressive disorder, single episode, mild.  this is a routine follow-up.  Presently, she denies any depressive symptoms.  Current medications include Celexa and Wellbutrin SR.  Presently, Kemper denies suicidal ideation.      Mystique presents with a diagnosis of obstructive sleep apnea (adult) (pediatric).  The course has been stable and nonprogressive.  Patient states she is compliant with cpap and it is beneficial.    Estee presents with a diagnosis of impaired fasting glucose.  The course has been stable and nonprogressive.  Compliance with treatment has been good; maintains her healthy diet , follows up as directed , and maintains her exercise regimen.    Brelynn presents with a diagnosis of other obesity due to excess calories.  The course has been improving.  Eating healthier and exercising yet.    Current Outpatient Medications on File Prior to Visit  Medication Sig Dispense Refill  . LOTEMAX 0.5 % ophthalmic suspension     . Multiple Vitamins-Minerals (MULTIVITAL PO) Take by mouth.    . Omeprazole (PRILOSEC PO) Take 20 mg by mouth every morning.     . traMADol (ULTRAM) 50 MG tablet Take 1 tablet (50 mg total) by mouth every 6 (six) hours as needed. 20 tablet 0   No current facility-administered medications on file prior to visit.   Past Medical History:  Diagnosis Date  . Anxiety   . Depression   . GERD (gastroesophageal reflux  disease)   . Hyperlipidemia   . Prediabetes   . Sleep apnea    uses CPAP every night   Past Surgical History:  Procedure Laterality Date  . CESAREAN SECTION    . DIAGNOSTIC LAPAROSCOPY    . HERNIA REPAIR    . JOINT REPLACEMENT     knee replacements bilat  . JOINT REPLACEMENT     hip replacements bilat  . ORIF ORBITAL FRACTURE Right 12/19/2018   Procedure: OPEN TREATMENT RIGHT ORBITAL FLOOR WITH IMPLANT, PERIORBITAL APPROACH;  Surgeon: Glenna Fellows, MD;  Location: [facility];  Service: Plastics;  Laterality: Right;  . ORIF ORBITAL FRACTURE Right 02/03/2019   Procedure: OPEN TREATMENT RIGHT ORBITAL FLOOR FRACTURE REVISION, PERI ORBITAL APPROACH;  Surgeon: Glenna Fellows, MD;  Location: [facility];  Service: Plastics;  Laterality: Right;    No family history on file. Social History   Socioeconomic History  . Marital status: Divorced    Spouse name: Not on file  . Number of children: Not on file  . Years of education: Not on file  . Highest education level: Not on file  Occupational History  . Not on file  Tobacco Use  . Smoking status: Former Smoker    Quit date: 12/18/1985    Years since quitting: 33.6  . Smokeless tobacco: Never Used  Vaping Use  . Vaping Use: Never used  Substance and Sexual Activity  . Alcohol use: Yes    Alcohol/week: 3.0 standard drinks  Types: 2 Glasses of wine, 1 Shots of liquor per week    Comment: 2 glasses /week  . Drug use: Never  . Sexual activity: Not on file  Other Topics Concern  . Not on file  Social History Narrative  . Not on file   Social Determinants of Health   Financial Resource Strain:   . Difficulty of Paying Living Expenses:   Food Insecurity:   . Worried About Programme researcher, broadcasting/film/video in the Last Year:   . Barista in the Last Year:   Transportation Needs:   . Freight forwarder (Medical):   Marland Kitchen Lack of Transportation (Non-Medical):   Physical Activity:   . Days of  Exercise per Week:   . Minutes of Exercise per Session:   Stress:   . Feeling of Stress :   Social Connections:   . Frequency of Communication with Friends and Family:   . Frequency of Social Gatherings with Friends and Family:   . Attends Religious Services:   . Active Member of Clubs or Organizations:   . Attends Banker Meetings:   Marland Kitchen Marital Status:     Review of Systems  Constitutional: Negative for chills, fatigue and fever.  HENT: Positive for congestion. Negative for ear pain, rhinorrhea and sore throat.   Respiratory: Negative for cough and shortness of breath.   Cardiovascular: Negative for chest pain.  Gastrointestinal: Negative for abdominal pain, constipation, diarrhea, nausea and vomiting.  Genitourinary: Negative for dysuria and urgency.  Musculoskeletal: Negative for back pain and myalgias.  Allergic/Immunologic: Positive for environmental allergies.  Neurological: Negative for dizziness, weakness, light-headedness and headaches.  Psychiatric/Behavioral: Positive for dysphoric mood (currently on medication). The patient is not nervous/anxious.      Objective:  BP 130/84   Pulse 74   Temp (!) 95.4 F (35.2 C)   Ht 5\' 5"  (1.651 m)   Wt 206 lb (93.4 kg)   SpO2 99%   BMI 34.28 kg/m   BP/Weight 07/17/2019 04/03/2019 02/03/2019  Systolic BP 130 120 139  Diastolic BP 84 84 78  Wt. (Lbs) 206 216.8 205.69  BMI 34.28 36.08 34.23    Physical Exam Vitals reviewed.  Constitutional:      Appearance: Normal appearance. She is obese.  Cardiovascular:     Rate and Rhythm: Normal rate and regular rhythm.     Pulses: Normal pulses.     Heart sounds: Normal heart sounds.  Pulmonary:     Effort: Pulmonary effort is normal. No respiratory distress.     Breath sounds: Normal breath sounds.  Abdominal:     General: Abdomen is flat. Bowel sounds are normal.     Palpations: Abdomen is soft.     Tenderness: There is no abdominal tenderness.  Neurological:      Mental Status: She is alert and oriented to person, place, and time.  Psychiatric:        Mood and Affect: Mood normal.        Behavior: Behavior normal.     Diabetic Foot Exam - Simple   Simple Foot Form Diabetic Foot exam was performed with the following findings: Yes 07/17/2019  8:20 AM  Visual Inspection See comments: Yes Sensation Testing See comments: Yes Pulse Check Posterior Tibialis and Dorsalis pulse intact bilaterally: Yes Comments Decreased sensation on right toes.      No results found for: WBC, HGB, HCT, PLT, GLUCOSE, CHOL, TRIG, HDL, LDLDIRECT, LDLCALC, ALT, AST, NA, K, CL, CREATININE, BUN, CO2,  TSH, PSA, INR, GLUF, HGBA1C, MICROALBUR    Assessment & Plan:   1. Mixed hyperlipidemia Fairly well controlled.  No changes to medicines.  Continue to work on eating a healthy diet and exercise.  Labs drawn today.  - Comprehensive metabolic panel - Lipid panel  2. Gastroesophageal reflux disease without esophagitis The current medical regimen is effective;  continue present plan and medications.  3. Prediabetes Recommend continue to work on eating healthy diet and exercise. - CBC with Differential/Platelet - Hemoglobin A1c  4. Depression, mild, recurrent The current medical regimen is effective;  continue present plan and medications.  5. Toe pain, right - Uric Acid   6. Class 1 obesity due to excess calories with serious comorbidity and body mass index (BMI) of 34.0 to 34.9 in adult Recommend continue to work on eating healthy diet and exercise.  7. RLS The current medical regimen is effective;  continue present plan and medications.  Meds ordered this encounter  Medications  . buPROPion (WELLBUTRIN SR) 150 MG 12 hr tablet    Sig: tablet    Dispense:  180 tablet    Refill:  0  . citalopram (CELEXA) 40 MG tablet    Sig: tablet    Dispense:  90 tablet    Refill:  0  . pramipexole (MIRAPEX) 0.5 MG tablet    Sig: Take 1 tablet (0.5 mg total) by  mouth 3 (three) times daily.    Dispense:  90 tablet    Refill:  0  . simvastatin (ZOCOR) 20 MG tablet    Sig: Take 1 tablet (20 mg total) by mouth daily.    Dispense:  90 tablet    Refill:  0    Orders Placed This Encounter  Procedures  . CBC with Differential/Platelet  . Comprehensive metabolic panel  . Hemoglobin A1c  . Lipid panel  . Uric Acid     Follow-up: No follow-ups on file.  An After Visit Summary was printed and given to the patient.  Rochel Brome [facility] [phone]

## 2019-07-17 ENCOUNTER — Other Ambulatory Visit: Payer: Self-pay

## 2019-07-17 ENCOUNTER — Ambulatory Visit (INDEPENDENT_AMBULATORY_CARE_PROVIDER_SITE_OTHER): Payer: Medicare Other | Admitting: Family Medicine

## 2019-07-17 ENCOUNTER — Encounter: Payer: Self-pay | Admitting: Family Medicine

## 2019-07-17 VITALS — BP 130/84 | HR 74 | Temp 95.4°F | Ht 65.0 in | Wt 206.0 lb

## 2019-07-17 DIAGNOSIS — E6609 Other obesity due to excess calories: Secondary | ICD-10-CM

## 2019-07-17 DIAGNOSIS — E782 Mixed hyperlipidemia: Secondary | ICD-10-CM | POA: Diagnosis not present

## 2019-07-17 DIAGNOSIS — R7303 Prediabetes: Secondary | ICD-10-CM | POA: Diagnosis not present

## 2019-07-17 DIAGNOSIS — F330 Major depressive disorder, recurrent, mild: Secondary | ICD-10-CM

## 2019-07-17 DIAGNOSIS — Z6834 Body mass index (BMI) 34.0-34.9, adult: Secondary | ICD-10-CM

## 2019-07-17 DIAGNOSIS — M79674 Pain in right toe(s): Secondary | ICD-10-CM | POA: Diagnosis not present

## 2019-07-17 DIAGNOSIS — G2581 Restless legs syndrome: Secondary | ICD-10-CM | POA: Insufficient documentation

## 2019-07-17 DIAGNOSIS — K219 Gastro-esophageal reflux disease without esophagitis: Secondary | ICD-10-CM

## 2019-07-17 MED ORDER — PRAMIPEXOLE DIHYDROCHLORIDE 0.5 MG PO TABS: 0.5000 mg | ORAL_TABLET | Freq: Three times a day (TID) | ORAL | 0 refills | Status: DC

## 2019-07-17 MED ORDER — BUPROPION HCL ER (SR) 150 MG PO TB12: ORAL_TABLET | ORAL | 0 refills | Status: DC

## 2019-07-17 MED ORDER — CITALOPRAM HYDROBROMIDE 40 MG PO TABS: ORAL_TABLET | ORAL | 0 refills | Status: DC

## 2019-07-17 MED ORDER — SIMVASTATIN 20 MG PO TABS: 20.0000 mg | ORAL_TABLET | Freq: Every day | ORAL | 0 refills | Status: DC

## 2019-07-17 NOTE — Patient Instructions (Signed)
Recommend continue to work on eating healthy diet and exercise. Recommend weight loss of 15 lbs by next visit. :)   Healthy Eating Following a healthy eating pattern may help you to achieve and maintain a healthy body weight, reduce the risk of chronic disease, and live a long and productive life. It is important to follow a healthy eating pattern at an appropriate calorie level for your body. Your nutritional needs should be met primarily through food by choosing a variety of nutrient-rich foods. What are tips for following this plan? Reading food labels  Read labels and choose the following: ? Reduced or low sodium. ? Juices with 100% fruit juice. ? Foods with low saturated fats and high polyunsaturated and monounsaturated fats. ? Foods with whole grains, such as whole wheat, cracked wheat, brown rice, and wild rice. ? Whole grains that are fortified with folic acid. This is recommended for women who are pregnant or who want to become pregnant.  Read labels and avoid the following: ? Foods with a lot of added sugars. These include foods that contain brown sugar, corn sweetener, corn syrup, dextrose, fructose, glucose, high-fructose corn syrup, honey, invert sugar, lactose, malt syrup, maltose, molasses, raw sugar, sucrose, trehalose, or turbinado sugar.  Do not eat more than the following amounts of added sugar per day:  6 teaspoons (25 g) for women.  9 teaspoons (38 g) for men. ? Foods that contain processed or refined starches and grains. ? Refined grain products, such as white flour, degermed cornmeal, white bread, and white rice. Shopping  Choose nutrient-rich snacks, such as vegetables, whole fruits, and nuts. Avoid high-calorie and high-sugar snacks, such as potato chips, fruit snacks, and candy.  Use oil-based dressings and spreads on foods instead of solid fats such as butter, stick margarine, or cream cheese.  Limit pre-made sauces, mixes, and "instant" products such as  flavored rice, instant noodles, and ready-made pasta.  Try more plant-protein sources, such as tofu, tempeh, black beans, edamame, lentils, nuts, and seeds.  Explore eating plans such as the Mediterranean diet or vegetarian diet. Cooking  Use oil to saut or stir-fry foods instead of solid fats such as butter, stick margarine, or lard.  Try baking, boiling, grilling, or broiling instead of frying.  Remove the fatty part of meats before cooking.  Steam vegetables in water or broth. Meal planning   At meals, imagine dividing your plate into fourths: ? One-half of your plate is fruits and vegetables. ? One-fourth of your plate is whole grains. ? One-fourth of your plate is protein, especially lean meats, poultry, eggs, tofu, beans, or nuts.  Include low-fat dairy as part of your daily diet. Lifestyle  Choose healthy options in all settings, including home, work, school, restaurants, or stores.  Prepare your food safely: ? Wash your hands after handling raw meats. ? Keep food preparation surfaces clean by regularly washing with hot, soapy water. ? Keep raw meats separate from ready-to-eat foods, such as fruits and vegetables. ? Cook seafood, meat, poultry, and eggs to the recommended internal temperature. ? Store foods at safe temperatures. In general:  Keep cold foods at 15F (4.4C) or below.  Keep hot foods at 115F (60C) or above.  Keep your freezer at [facility] (-17.8C) or below.  Foods are no longer safe to eat when they have been between the temperatures of 40-115F (4.4-60C) for more than 2 hours. What foods should I eat? Fruits Aim to eat 2 cup-equivalents of fresh, canned (in natural juice), or frozen  fruits each day. Examples of 1 cup-equivalent of fruit include 1 small apple, 8 large strawberries, 1 cup canned fruit,  cup dried fruit, or 1 cup 100% juice. Vegetables Aim to eat 2-3 cup-equivalents of fresh and frozen vegetables each day, including different  varieties and colors. Examples of 1 cup-equivalent of vegetables include 2 medium carrots, 2 cups raw, leafy greens, 1 cup chopped vegetable (raw or cooked), or 1 medium baked potato. Grains Aim to eat 6 ounce-equivalents of whole grains each day. Examples of 1 ounce-equivalent of grains include 1 slice of bread, 1 cup ready-to-eat cereal, 3 cups popcorn, or  cup cooked rice, pasta, or cereal. Meats and other proteins Aim to eat 5-6 ounce-equivalents of protein each day. Examples of 1 ounce-equivalent of protein include 1 egg, 1/2 cup nuts or seeds, or 1 tablespoon (16 g) peanut butter. A cut of meat or fish that is the size of a deck of cards is about 3-4 ounce-equivalents.  Of the protein you eat each week, try to have at least 8 ounces come from seafood. This includes salmon, trout, herring, and anchovies. Dairy Aim to eat 3 cup-equivalents of fat-free or low-fat dairy each day. Examples of 1 cup-equivalent of dairy include 1 cup (240 mL) milk, 8 ounces (250 g) yogurt, 1 ounces (44 g) natural cheese, or 1 cup (240 mL) fortified soy milk. Fats and oils  Aim for about 5 teaspoons (21 g) per day. Choose monounsaturated fats, such as canola and olive oils, avocados, peanut butter, and most nuts, or polyunsaturated fats, such as sunflower, corn, and soybean oils, walnuts, pine nuts, sesame seeds, sunflower seeds, and flaxseed. Beverages  Aim for six 8-oz glasses of water per day. Limit coffee to three to five 8-oz cups per day.  Limit caffeinated beverages that have added calories, such as soda and energy drinks.  Limit alcohol intake to no more than 1 drink a day for nonpregnant women and 2 drinks a day for men. One drink equals 12 oz of beer (355 mL), 5 oz of wine (148 mL), or 1 oz of hard liquor (44 mL). Seasoning and other foods  Avoid adding excess amounts of salt to your foods. Try flavoring foods with herbs and spices instead of salt.  Avoid adding sugar to foods.  Try using  oil-based dressings, sauces, and spreads instead of solid fats. This information is based on general [place] nutrition guidelines. For more information, visit [employer]. Exact amounts may vary based on your nutrition needs. Summary  A healthy eating plan may help you to maintain a healthy weight, reduce the risk of chronic diseases, and stay active throughout your life.  Plan your meals. Make sure you eat the right portions of a variety of nutrient-rich foods.  Try baking, boiling, grilling, or broiling instead of frying.  Choose healthy options in all settings, including home, work, school, restaurants, or stores. This information is not intended to replace advice given to you by your health care provider. Make sure you discuss any questions you have with your health care provider. Document Revised: 05/06/2017 Document Reviewed: 05/06/2017 Elsevier Patient Education  [employer].

## 2019-07-18 LAB — CBC WITH DIFFERENTIAL/PLATELET
Basophils Absolute: 0.1 10*3/uL (ref 0.0–0.2)
Basos: 1 %
EOS (ABSOLUTE): 0.2 10*3/uL (ref 0.0–0.4)
Eos: 2 %
Hematocrit: 40.5 % (ref 34.0–46.6)
Hemoglobin: 13.3 g/dL (ref 11.1–15.9)
Immature Grans (Abs): 0 10*3/uL (ref 0.0–0.1)
Immature Granulocytes: 0 %
Lymphocytes Absolute: 1.7 10*3/uL (ref 0.7–3.1)
Lymphs: 20 %
MCH: 30.7 pg (ref 26.6–33.0)
MCHC: 32.8 g/dL (ref 31.5–35.7)
MCV: 94 fL (ref 79–97)
Monocytes Absolute: 0.8 10*3/uL (ref 0.1–0.9)
Monocytes: 10 %
Neutrophils Absolute: 5.5 10*3/uL (ref 1.4–7.0)
Neutrophils: 67 %
Platelets: 206 10*3/uL (ref 150–450)
RBC: 4.33 x10E6/uL (ref 3.77–5.28)
RDW: 13 % (ref 11.7–15.4)
WBC: 8.3 10*3/uL (ref 3.4–10.8)

## 2019-07-18 LAB — COMPREHENSIVE METABOLIC PANEL
ALT: 14 IU/L (ref 0–32)
AST: 16 IU/L (ref 0–40)
Albumin/Globulin Ratio: 2.2 (ref 1.2–2.2)
Albumin: 4.3 g/dL (ref 3.7–4.7)
Alkaline Phosphatase: 69 IU/L (ref 48–121)
BUN/Creatinine Ratio: 15 (ref 12–28)
BUN: 15 mg/dL (ref 8–27)
Bilirubin Total: 0.5 mg/dL (ref 0.0–1.2)
CO2: 22 mmol/L (ref 20–29)
Calcium: 9.5 mg/dL (ref 8.7–10.3)
Chloride: 108 mmol/L — ABNORMAL HIGH (ref 96–106)
Creatinine, Ser: 1.01 mg/dL — ABNORMAL HIGH (ref 0.57–1.00)
GFR calc Af Amer: 64 mL/min/{1.73_m2} (ref >59)
GFR calc non Af Amer: 55 mL/min/{1.73_m2} — ABNORMAL LOW (ref >59)
Globulin, Total: 2 g/dL (ref 1.5–4.5)
Glucose: 120 mg/dL — ABNORMAL HIGH (ref 65–99)
Potassium: 4.9 mmol/L (ref 3.5–5.2)
Sodium: 143 mmol/L (ref 134–144)
Total Protein: 6.3 g/dL (ref 6.0–8.5)

## 2019-07-18 LAB — CARDIOVASCULAR RISK ASSESSMENT

## 2019-07-18 LAB — SPECIMEN STATUS REPORT

## 2019-07-18 LAB — LIPID PANEL
Chol/HDL Ratio: 3 ratio (ref 0.0–4.4)
Cholesterol, Total: 161 mg/dL (ref 100–199)
HDL: 54 mg/dL (ref >39)
LDL Chol Calc (NIH): 88 mg/dL (ref 0–99)
Triglycerides: 105 mg/dL (ref 0–149)
VLDL Cholesterol Cal: 19 mg/dL (ref 5–40)

## 2019-07-18 LAB — HEMOGLOBIN A1C
Est. average glucose Bld gHb Est-mCnc: 126 mg/dL
Hgb A1c MFr Bld: 6 % — ABNORMAL HIGH (ref 4.8–5.6)

## 2019-07-18 LAB — URIC ACID: Uric Acid: 6.7 mg/dL (ref 3.1–7.9)

## 2019-07-28 ENCOUNTER — Ambulatory Visit: Payer: Medicare Other

## 2019-09-02 DIAGNOSIS — H43813 Vitreous degeneration, bilateral: Secondary | ICD-10-CM | POA: Diagnosis not present

## 2019-09-02 DIAGNOSIS — H2512 Age-related nuclear cataract, left eye: Secondary | ICD-10-CM | POA: Diagnosis not present

## 2019-09-02 DIAGNOSIS — Z961 Presence of intraocular lens: Secondary | ICD-10-CM | POA: Diagnosis not present

## 2019-10-21 ENCOUNTER — Other Ambulatory Visit: Payer: Self-pay

## 2019-10-21 ENCOUNTER — Encounter: Payer: Self-pay | Admitting: Family Medicine

## 2019-10-21 ENCOUNTER — Ambulatory Visit (INDEPENDENT_AMBULATORY_CARE_PROVIDER_SITE_OTHER): Payer: Medicare Other | Admitting: Family Medicine

## 2019-10-21 VITALS — BP 134/82 | HR 77 | Temp 93.7°F | Ht 65.0 in | Wt 204.0 lb

## 2019-10-21 DIAGNOSIS — G2581 Restless legs syndrome: Secondary | ICD-10-CM

## 2019-10-21 DIAGNOSIS — M2141 Flat foot [pes planus] (acquired), right foot: Secondary | ICD-10-CM

## 2019-10-21 DIAGNOSIS — M2142 Flat foot [pes planus] (acquired), left foot: Secondary | ICD-10-CM

## 2019-10-21 DIAGNOSIS — Z23 Encounter for immunization: Secondary | ICD-10-CM

## 2019-10-21 DIAGNOSIS — K219 Gastro-esophageal reflux disease without esophagitis: Secondary | ICD-10-CM

## 2019-10-21 DIAGNOSIS — E782 Mixed hyperlipidemia: Secondary | ICD-10-CM | POA: Diagnosis not present

## 2019-10-21 DIAGNOSIS — M2170 Unequal limb length (acquired), unspecified site: Secondary | ICD-10-CM

## 2019-10-21 DIAGNOSIS — F330 Major depressive disorder, recurrent, mild: Secondary | ICD-10-CM | POA: Diagnosis not present

## 2019-10-21 DIAGNOSIS — R7303 Prediabetes: Secondary | ICD-10-CM

## 2019-10-21 MED ORDER — PRAMIPEXOLE DIHYDROCHLORIDE 0.5 MG PO TABS: 0.5000 mg | ORAL_TABLET | Freq: Every day | ORAL | 1 refills | Status: DC

## 2019-10-21 MED ORDER — CITALOPRAM HYDROBROMIDE 40 MG PO TABS: ORAL_TABLET | ORAL | 0 refills | Status: DC

## 2019-10-21 MED ORDER — SIMVASTATIN 20 MG PO TABS: 20.0000 mg | ORAL_TABLET | Freq: Every day | ORAL | 0 refills | Status: DC

## 2019-10-21 MED ORDER — BUPROPION HCL ER (SR) 150 MG PO TB12: ORAL_TABLET | ORAL | 0 refills | Status: DC

## 2019-10-21 NOTE — Patient Instructions (Signed)
Dr. Victorino Dike is orthopedic ankle/foot surgeon. Check insurance and call Creola Corn, LPN back for referral.

## 2019-10-21 NOTE — Progress Notes (Signed)
Subjective:  Patient ID: Connie Marks, female    DOB: May 09, 1945  Age: 74 y.o. MRN: [id number]  Chief Complaint  Patient presents with   Depression   Hyperlipidemia   Prediabetes    HPI  Pt presents with hyperlipidemia.  Current treatment includes Zocor.  Compliance with treatment has been good; she takes her medication as directed, maintains her low cholesterol diet, follows up as directed, and maintains her exercise regimen.  She denies experiencing any hypercholesterolemia related symptoms.      Additionally, she presents with history of major depressive disorder, single episode, mild.  this is a routine follow-up.  Presently, she denies any depressive symptoms.  Current medications include Celexa and Wellbutrin SR.  Presently, Connie Marks denies suicidal ideation.      Connie Marks presents with a diagnosis of obstructive sleep apnea (adult) (pediatric).  The course has been stable and nonprogressive.  Patient states she is compliant with cpap and it is beneficial.  She needs new supplies.    Connie Marks presents with a diagnosis of impaired fasting glucose.  The course has been stable and nonprogressive.  Compliance with treatment has been good; maintains her healthy diet , follows up as directed , and maintains her exercise regimen.    Connie Marks presents with a diagnosis of other obesity due to excess calories.  The course has been improving.  Eating healthier and exercising yet.     Current Outpatient Medications on File Prior to Visit  Medication Sig Dispense Refill   buPROPion (WELLBUTRIN SR) 150 MG 12 hr tablet tablet 180 tablet 0   citalopram (CELEXA) 40 MG tablet tablet 90 tablet 0   LOTEMAX 0.5 % ophthalmic suspension      Multiple Vitamins-Minerals (MULTIVITAL PO) Take by mouth.     Omeprazole (PRILOSEC PO) Take 20 mg by mouth every morning.      pramipexole (MIRAPEX) 0.5 MG tablet Take 1 tablet (0.5 mg total) by mouth 3 (three) times daily. 90 tablet 0   simvastatin (ZOCOR) 20  MG tablet Take 1 tablet (20 mg total) by mouth daily. 90 tablet 0   traMADol (ULTRAM) 50 MG tablet Take 1 tablet (50 mg total) by mouth every 6 (six) hours as needed. 20 tablet 0   No current facility-administered medications on file prior to visit.   Past Medical History:  Diagnosis Date   Anxiety    Depression    GERD (gastroesophageal reflux disease)    Hyperlipidemia    Prediabetes    Sleep apnea    uses CPAP every night   Past Surgical History:  Procedure Laterality Date   CESAREAN SECTION     DIAGNOSTIC LAPAROSCOPY     HERNIA REPAIR     JOINT REPLACEMENT     knee replacements bilat   JOINT REPLACEMENT     hip replacements bilat   ORIF ORBITAL FRACTURE Right 12/19/2018   Procedure: OPEN TREATMENT RIGHT ORBITAL FLOOR WITH IMPLANT, PERIORBITAL APPROACH;  Surgeon: Glenna Fellows, MD;  Location: [facility];  Service: Plastics;  Laterality: Right;   ORIF ORBITAL FRACTURE Right 02/03/2019   Procedure: OPEN TREATMENT RIGHT ORBITAL FLOOR FRACTURE REVISION, PERI ORBITAL APPROACH;  Surgeon: Glenna Fellows, MD;  Location: [facility];  Service: Plastics;  Laterality: Right;    No family history on file. Social History   Socioeconomic History   Marital status: Divorced    Spouse name: Not on file   Number of children: Not on file   Years of education: Not on  file   Highest education level: Not on file  Occupational History   Not on file  Tobacco Use   Smoking status: Former Smoker    Quit date: 12/18/1985    Years since quitting: 33.8   Smokeless tobacco: Never Used  Vaping Use   Vaping Use: Never used  Substance and Sexual Activity   Alcohol use: Yes    Alcohol/week: 3.0 standard drinks    Types: 2 Glasses of wine, 1 Shots of liquor per week    Comment: 2 glasses /week   Drug use: Never   Sexual activity: Not on file  Other Topics Concern   Not on file  Social History Narrative   Not on file   Social  Determinants of Health   Financial Resource Strain:    Difficulty of Paying Living Expenses: Not on file  Food Insecurity:    Worried About Running Out of Food in the Last Year: Not on file   Ran Out of Food in the Last Year: Not on file  Transportation Needs:    Lack of Transportation (Medical): Not on file   Lack of Transportation (Non-Medical): Not on file  Physical Activity:    Days of Exercise per Week: Not on file   Minutes of Exercise per Session: Not on file  Stress:    Feeling of Stress : Not on file  Social Connections:    Frequency of Communication with Friends and Family: Not on file   Frequency of Social Gatherings with Friends and Family: Not on file   Attends Religious Services: Not on file   Active Member of Clubs or Organizations: Not on file   Attends Banker Meetings: Not on file   Marital Status: Not on file    Review of Systems  Constitutional: Negative for chills, fatigue and fever.  HENT: Negative for congestion, ear pain, rhinorrhea and sore throat.   Respiratory: Negative for cough and shortness of breath.   Cardiovascular: Negative for chest pain.  Gastrointestinal: Negative for abdominal pain, constipation, diarrhea, nausea and vomiting.  Genitourinary: Negative for dysuria and urgency.  Musculoskeletal: Positive for arthralgias. Negative for back pain and myalgias.  Allergic/Immunologic: Negative for environmental allergies.  Neurological: Negative for dizziness, weakness, light-headedness and headaches.  Psychiatric/Behavioral: Negative for dysphoric mood (currently on medication). The patient is not nervous/anxious.      Objective:  BP 134/82    Pulse 77    Temp (!) 93.7 F (34.3 C)    Ht 5\' 5"  (1.651 m)    Wt 204 lb (92.5 kg)    SpO2 99%    BMI 33.95 kg/m   BP/Weight 10/21/2019 07/17/2019 04/03/2019  Systolic BP 134 130 120  Diastolic BP 82 84 84  Wt. (Lbs) 204 206 216.8  BMI 33.95 34.28 36.08    Physical  Exam Vitals reviewed.  Constitutional:      Appearance: Normal appearance. She is obese.  Neck:     Vascular: No carotid bruit.  Cardiovascular:     Rate and Rhythm: Normal rate and regular rhythm.     Pulses: Normal pulses.     Heart sounds: Normal heart sounds.  Pulmonary:     Effort: Pulmonary effort is normal. No respiratory distress.     Breath sounds: Normal breath sounds.  Abdominal:     General: Abdomen is flat. Bowel sounds are normal.     Palpations: Abdomen is soft.     Tenderness: There is no abdominal tenderness.  Musculoskeletal:  Comments: Flat feet, claw toes of all toes except great toes. Left great toe is enlarged since injury 6 months ago. Right leg is shorter than left leg. Rt leg varus deformity.   Neurological:     Mental Status: She is alert and oriented to person, place, and time.  Psychiatric:        Mood and Affect: Mood normal.        Behavior: Behavior normal.     Diabetic Foot Exam - Simple   No data filed       Lab Results  Component Value Date   WBC 8.3 07/17/2019   HGB 13.3 07/17/2019   HCT 40.5 07/17/2019   PLT 206 07/17/2019   GLUCOSE 120 (H) 07/17/2019   CHOL 161 07/17/2019   TRIG 105 07/17/2019   HDL 54 07/17/2019   LDLCALC 88 07/17/2019   ALT 14 07/17/2019   AST 16 07/17/2019   NA 143 07/17/2019   K 4.9 07/17/2019   CL 108 (H) 07/17/2019   CREATININE 1.01 (H) 07/17/2019   BUN 15 07/17/2019   CO2 22 07/17/2019   HGBA1C 6.0 (H) 07/17/2019      Assessment & Plan:  1. Mixed hyperlipidemia Well controlled.  No changes to medicines.  Continue to work on eating a healthy diet and exercise.  Labs drawn today.  - CBC with Differential/Platelet - Comprehensive metabolic panel - Lipid panel - simvastatin (ZOCOR) 20 MG tablet; Take 1 tablet (20 mg total) by mouth daily.  Dispense: 90 tablet; Refill: 0  2. Gastroesophageal reflux disease without esophagitis The current medical regimen is effective;  continue present plan  and medications.  3. Prediabetes Recommend continue to work on eating healthy diet and exercise. - Hemoglobin A1c  4. Mild episode of recurrent major depressive disorder (HCC) The current medical regimen is effective;  continue present plan and medications. - buPROPion (WELLBUTRIN SR) 150 MG 12 hr tablet; tablet  Dispense: 180 tablet; Refill: 0 - citalopram (CELEXA) 40 MG tablet; tablet  Dispense: 90 tablet; Refill: 0  5. RLS (restless legs syndrome) The current medical regimen is effective;  continue present plan and medications. - pramipexole (MIRAPEX) 0.5 MG tablet; Take 1 tablet (0.5 mg total) by mouth at bedtime.  Dispense: 90 tablet; Refill: 1  6. Need for immunization against influenza - Flu Vaccine QUAD High Dose(Fluad)  7. Acquired leg length discrepancy Refer to ortho  8. Flat feet Refer to ortho   Orders Placed This Encounter  Procedures   Flu Vaccine QUAD High Dose(Fluad)     Follow-up: Return in about 3 months (around 01/20/2020) for fasting.  An After Visit Summary was printed and given to the patient.  Blane Ohara [facility] [phone]

## 2019-10-22 LAB — COMPREHENSIVE METABOLIC PANEL
ALT: 17 IU/L (ref 0–32)
AST: 16 IU/L (ref 0–40)
Albumin/Globulin Ratio: 2 (ref 1.2–2.2)
Albumin: 4.5 g/dL (ref 3.7–4.7)
Alkaline Phosphatase: 80 IU/L (ref 44–121)
BUN/Creatinine Ratio: 15 (ref 12–28)
BUN: 17 mg/dL (ref 8–27)
Bilirubin Total: 0.3 mg/dL (ref 0.0–1.2)
CO2: 23 mmol/L (ref 20–29)
Calcium: 9.6 mg/dL (ref 8.7–10.3)
Chloride: 103 mmol/L (ref 96–106)
Creatinine, Ser: 1.1 mg/dL — ABNORMAL HIGH (ref 0.57–1.00)
GFR calc Af Amer: 57 mL/min/{1.73_m2} — ABNORMAL LOW (ref >59)
GFR calc non Af Amer: 50 mL/min/{1.73_m2} — ABNORMAL LOW (ref >59)
Globulin, Total: 2.3 g/dL (ref 1.5–4.5)
Glucose: 93 mg/dL (ref 65–99)
Potassium: 5 mmol/L (ref 3.5–5.2)
Sodium: 140 mmol/L (ref 134–144)
Total Protein: 6.8 g/dL (ref 6.0–8.5)

## 2019-10-22 LAB — CBC WITH DIFFERENTIAL/PLATELET
Basophils Absolute: 0.1 10*3/uL (ref 0.0–0.2)
Basos: 1 %
EOS (ABSOLUTE): 0.2 10*3/uL (ref 0.0–0.4)
Eos: 3 %
Hematocrit: 43.4 % (ref 34.0–46.6)
Hemoglobin: 13.9 g/dL (ref 11.1–15.9)
Immature Grans (Abs): 0 10*3/uL (ref 0.0–0.1)
Immature Granulocytes: 0 %
Lymphocytes Absolute: 2.2 10*3/uL (ref 0.7–3.1)
Lymphs: 28 %
MCH: 29.8 pg (ref 26.6–33.0)
MCHC: 32 g/dL (ref 31.5–35.7)
MCV: 93 fL (ref 79–97)
Monocytes Absolute: 0.9 10*3/uL (ref 0.1–0.9)
Monocytes: 11 %
Neutrophils Absolute: 4.7 10*3/uL (ref 1.4–7.0)
Neutrophils: 57 %
Platelets: 233 10*3/uL (ref 150–450)
RBC: 4.66 x10E6/uL (ref 3.77–5.28)
RDW: 13.3 % (ref 11.7–15.4)
WBC: 8.1 10*3/uL (ref 3.4–10.8)

## 2019-10-22 LAB — LIPID PANEL
Chol/HDL Ratio: 3 ratio (ref 0.0–4.4)
Cholesterol, Total: 178 mg/dL (ref 100–199)
HDL: 59 mg/dL (ref >39)
LDL Chol Calc (NIH): 100 mg/dL — ABNORMAL HIGH (ref 0–99)
Triglycerides: 109 mg/dL (ref 0–149)
VLDL Cholesterol Cal: 19 mg/dL (ref 5–40)

## 2019-10-22 LAB — CARDIOVASCULAR RISK ASSESSMENT

## 2019-10-22 LAB — HEMOGLOBIN A1C
Est. average glucose Bld gHb Est-mCnc: 123 mg/dL
Hgb A1c MFr Bld: 5.9 % — ABNORMAL HIGH (ref 4.8–5.6)

## 2019-12-08 ENCOUNTER — Ambulatory Visit (INDEPENDENT_AMBULATORY_CARE_PROVIDER_SITE_OTHER): Payer: Medicare Other

## 2019-12-08 ENCOUNTER — Telehealth: Payer: Self-pay | Admitting: Family Medicine

## 2019-12-08 DIAGNOSIS — Z23 Encounter for immunization: Secondary | ICD-10-CM

## 2019-12-08 NOTE — Progress Notes (Signed)
   Covid-19 Vaccination Clinic  Name:  EVER HALBERG    MRN: [id number] DOB: 04/23/1945  12/08/2019  Ms. Greaser was observed post Covid-19 immunization for 15 minutes without incident. She was provided with Vaccine Information Sheet and instruction to access the V-Safe system.   Ms. Dome was instructed to call 911 with any severe reactions post vaccine: Marland Kitchen Difficulty breathing  . Swelling of face and throat  . A fast heartbeat  . A bad rash all over body  . Dizziness and weakness

## 2019-12-08 NOTE — Chronic Care Management (AMB) (Signed)
°  Chronic Care Management   Note  12/08/2019 Name: Connie Marks MRN: [id number] DOB: [birth year]  Connie Marks is a 74 y.o. year old female who is a primary care patient of Cox, Kirsten, MD. I reached out to [name] by phone today in response to a referral sent by [name] PCP, Cox, Kirsten, MD.   [name] was given information about Chronic Care Management services today including:  1. CCM service includes personalized support from designated clinical staff supervised by her physician, including individualized plan of care and coordination with other care providers 2. 24/7 contact phone numbers for assistance for urgent and routine care needs. 3. Service will only be billed when office clinical staff spend 20 minutes or more in a month to coordinate care. 4. Only one practitioner may furnish and bill the service in a calendar month. 5. The patient may stop CCM services at any time (effective at the end of the month) by phone call to the office staff.   Patient agreed to services and verbal consent obtained.   Follow up plan:   Aggie Hacker  Upstream Scheduler

## 2019-12-21 DIAGNOSIS — M76821 Posterior tibial tendinitis, right leg: Secondary | ICD-10-CM | POA: Insufficient documentation

## 2019-12-21 DIAGNOSIS — M79671 Pain in right foot: Secondary | ICD-10-CM | POA: Diagnosis not present

## 2019-12-21 DIAGNOSIS — M25571 Pain in right ankle and joints of right foot: Secondary | ICD-10-CM | POA: Diagnosis not present

## 2019-12-21 DIAGNOSIS — S92919A Unspecified fracture of unspecified toe(s), initial encounter for closed fracture: Secondary | ICD-10-CM | POA: Insufficient documentation

## 2019-12-21 DIAGNOSIS — M19071 Primary osteoarthritis, right ankle and foot: Secondary | ICD-10-CM | POA: Insufficient documentation

## 2019-12-21 DIAGNOSIS — S92422S Displaced fracture of distal phalanx of left great toe, sequela: Secondary | ICD-10-CM | POA: Diagnosis not present

## 2019-12-21 DIAGNOSIS — M25572 Pain in left ankle and joints of left foot: Secondary | ICD-10-CM | POA: Diagnosis not present

## 2019-12-21 DIAGNOSIS — M79672 Pain in left foot: Secondary | ICD-10-CM | POA: Diagnosis not present

## 2019-12-21 HISTORY — DX: Unspecified fracture of unspecified toe(s), initial encounter for closed fracture: S92.919A

## 2019-12-29 DIAGNOSIS — M25571 Pain in right ankle and joints of right foot: Secondary | ICD-10-CM | POA: Diagnosis not present

## 2019-12-29 DIAGNOSIS — M76821 Posterior tibial tendinitis, right leg: Secondary | ICD-10-CM | POA: Diagnosis not present

## 2019-12-29 DIAGNOSIS — M6281 Muscle weakness (generalized): Secondary | ICD-10-CM | POA: Diagnosis not present

## 2020-01-04 DIAGNOSIS — M6281 Muscle weakness (generalized): Secondary | ICD-10-CM | POA: Diagnosis not present

## 2020-01-04 DIAGNOSIS — M25571 Pain in right ankle and joints of right foot: Secondary | ICD-10-CM | POA: Diagnosis not present

## 2020-01-04 DIAGNOSIS — M76821 Posterior tibial tendinitis, right leg: Secondary | ICD-10-CM | POA: Diagnosis not present

## 2020-01-06 NOTE — Chronic Care Management (AMB) (Signed)
Chronic Care Management Pharmacy  Name: Connie Marks  MRN: [id number] DOB: 12-26-1945  Chief Complaint/ HPI  Connie Marks,  74 y.o. , female presents for their Initial CCM visit with the clinical pharmacist via telephone due to COVID-19 Pandemic.  PCP : Connie Brome, MD  Their chronic conditions include: GERD, Hyperlipidemia, depression, prediabetes, restless leg syndrome.   Office Visits: 12/08/2019 - Moderna Covid vaccine.  10/21/2019 - flu shot given. Refer to ortho. Continue current medications.  07/17/2019 - toe pain - uric acid labs drawn. Continue to work on [employer] and exercise.  Consult Visit: 09/02/2019 - Opthamology. No note available.   Medications: Outpatient Encounter Medications as of 01/12/2020  Medication Sig  . buPROPion (WELLBUTRIN SR) 150 MG 12 hr tablet tablet (Patient taking differently: Take 150 mg by mouth 2 (two) times daily. tablet)  . Calcium Carbonate+Vitamin D (CALCIUM 600 + D) 600-200 MG-UNIT TABS Take 1 tablet by mouth daily.  . citalopram (CELEXA) 40 MG tablet tablet (Patient taking differently: Take 40 mg by mouth in the morning. tablet)  . ibuprofen (ADVIL) 200 MG tablet Take 200 mg by mouth every 6 (six) hours as needed. Uses for arthritis pain  . lansoprazole (PREVACID) 30 MG capsule Take 30 mg by mouth daily at 12 noon.  . Multiple Vitamins-Minerals (MULTIVITAL PO) Take by mouth.  . Omeprazole (PRILOSEC PO) Take 20 mg by mouth every morning.   [name] Glycol-Propyl Glycol (SYSTANE) 0.4-0.3 % SOLN Apply 1 drop to eye daily as needed.  . simvastatin (ZOCOR) 20 MG tablet Take 1 tablet (20 mg total) by mouth daily.  . valACYclovir (VALTREX) 1000 MG tablet Take 2,000 mg by mouth 2 (two) times daily. 2 tablet bid at onset  . [DISCONTINUED] pramipexole (MIRAPEX) 0.5 MG tablet Take 1 tablet (0.5 mg total) by mouth at bedtime.  . [DISCONTINUED] LOTEMAX 0.5 % ophthalmic suspension    No facility-administered encounter medications on  file as of 01/12/2020.   Allergies  Allergen Reactions  . Adhesive [Tape]     Red whelps    SDOH Screenings   Alcohol Screen:   . Last Alcohol Screening Score (AUDIT): Not on file  Depression (PHQ2-9): Low Risk   . PHQ-2 Score: 0  Financial Resource Strain:   . Difficulty of Paying Living Expenses: Not on file  Food Insecurity: No Food Insecurity  . Worried About Charity fundraiser in the Last Year: Never true  . Ran Out of Food in the Last Year: Never true  Housing: Low Risk   . Last Housing Risk Score: 0  Physical Activity:   . Days of Exercise per Week: Not on file  . Minutes of Exercise per Session: Not on file  Social Connections:   . Frequency of Communication with Friends and Family: Not on file  . Frequency of Social Gatherings with Friends and Family: Not on file  . Attends Religious Services: Not on file  . Active Member of Clubs or Organizations: Not on file  . Attends Archivist Meetings: Not on file  . Marital Status: Not on file  Stress:   . Feeling of Stress : Not on file  Tobacco Use: Medium Risk  . Smoking Tobacco Use: Former Smoker  . Smokeless Tobacco Use: Never Used  Transportation Needs: No Transportation Needs  . Lack of Transportation (Medical): No  . Lack of Transportation (Non-Medical): No     Current Diagnosis/Assessment:  Goals Addressed  This Visit's Progress   . Pharmacy Care Plan       CARE PLAN ENTRY (see longitudinal plan of care for additional care plan information)  Current Barriers:  . Chronic Disease Management support, education, and care coordination needs related to Hyperlipidemia, GERD, and Depression  Hyperlipidemia Lab Results  Component Value Date/Time   LDLCALC 100 (H) 10/21/2019 09:09 AM   . Pharmacist Clinical Goal(s): o Over the next 90 days, patient will work with PharmD and providers to maintain LDL goal < 100 . Current regimen:  o Simvastatin 20 mg daily   . Interventions: o Discussed heart healthy diet and providing handout via mail with more information.  o Reviewed exercise at home and encouraged continuing to work with physical therapy to improve ability of riding exercise bike.  o Reviewed medications and adherence.  . Patient self care activities - Over the next 90 days, patient will: o Continue to take medications as prescribed.   Prediabetes Lab Results  Component Value Date/Time   HGBA1C 5.9 (H) 10/21/2019 09:09 AM   HGBA1C 6.0 (H) 07/17/2019 12:00 AM   . Pharmacist Clinical Goal(s): o Over the next 90 days, patient will work with PharmD and providers to maintain A1c goal <6.5% . Current regimen:  o Diet and lifestyle . Interventions: o Encouraged continuing to increase exercise as tolerated with physical therapy and exercise bike.  o Discussed continuing to make healthy diet choices.  . Patient self care activities - Over the next 0- days, patient will: o Continue managing blood sugar with healthy lifestyle choices.   GERD . Pharmacist Clinical Goal(s) o Over the next 90 days, patient will work with PharmD and providers to manage symptoms of GERD . Current regimen:  o Lansoprazole 30 mg daily  . Interventions: o Reviewed current medications and history of symptoms.  o Discussed loose waistbands, small meals and elevating head of bed to minimize symptoms.  . Patient self care activities - Over the next 90 days, patient will: o Work to reduce breakthrough symptoms with lifestyle modifications.   Depression . Pharmacist Clinical Goal(s) o Over the next 90 days, patient will work with PharmD and providers to maintain good control of depression symptoms.  . Current regimen:  o Bupropion SR 150 mg bid o Citalopram 40 mg daily  . Interventions: o Discussed patient's history of depression. o Patient pleased with current management of symptoms and denies concerns.  . Patient self care activities - Over the next 90 days,  patient will: o Continue taking medication as prescribed.   Medication management . Pharmacist Clinical Goal(s): o Over the next 90 days, patient will work with PharmD and providers to maintain optimal medication adherence . Current pharmacy: [facility] . Interventions o Comprehensive medication review performed. o Continue current medication management strategy . Patient self care activities - Over the next 90 days, patient will: o Focus on medication adherence by taking medication as prescribed and continue using system  o Take medications as prescribed o Report any questions or concerns to PharmD and/or provider(s)  Initial goal documentation        Prediabetes   Recent Relevant Labs: Lab Results  Component Value Date/Time   HGBA1C 5.9 (H) 10/21/2019 09:09 AM   HGBA1C 6.0 (H) 07/17/2019 12:00 AM    Lab Results  Component Value Date   CREATININE 1.10 (H) 10/21/2019   BUN 17 10/21/2019   GFRNONAA 50 (L) 10/21/2019   GFRAA 57 (L) 10/21/2019   NA 140 10/21/2019  K 5.0 10/21/2019   CALCIUM 9.6 10/21/2019   CO2 23 10/21/2019    Patient has failed these meds in past: none reported Patient is currently controlled on the following medications:   Diet/exercise  We discussed: diet and exercise extensively.   Diet: Trying to eat healthier. Hard around holidays.   Exercise: Minimal exercise right now. Seeing Physical Therapy for foot pain and hoping to be able to walk more once improved. Has had both knee and hip replaced. Hard to ride exercise bike due to flat foot/pigeon toe issue. Had a fall that caused double vision so has to be very careful walking to avoid further falls. Building up leg strength to avoid future falls. Going in person once a week and completing home exercises.   Plan  Continue control with diet and exercise   Hyperlipidemia   LDL goal < 100  Last lipids Lab Results  Component Value Date   CHOL 178 10/21/2019   HDL 59  10/21/2019   LDLCALC 100 (H) 10/21/2019   TRIG 109 10/21/2019   CHOLHDL 3.0 10/21/2019   Hepatic Function Latest Ref Rng & Units 10/21/2019 07/17/2019  Total Protein 6.0 - 8.5 g/dL 6.8 6.3  Albumin 3.7 - 4.7 g/dL 4.5 4.3  AST 0 - 40 IU/L 16 16  ALT 0 - 32 IU/L 17 14  Alk Phosphatase 44 - 121 IU/L 80 69  Total Bilirubin 0.0 - 1.2 mg/dL 0.3 0.5     The 10-year ASCVD risk score Mikey Bussing DC Jr., et al., 2013) is: 15.5%   Values used to calculate the score:     Age: 76 years     Sex: Female     Is Non-Hispanic African American: No     Diabetic: No     Tobacco smoker: No     Systolic Blood Pressure: 771 mmHg     Is BP treated: No     HDL Cholesterol: 59 mg/dL     Total Cholesterol: 178 mg/dL   Patient has failed these meds in past: none reported Patient is currently controlled on the following medications:  . Simvastatin 20 mg daily  We discussed:  diet and exercise extensively  Plan  Continue current medications  Depression   Depression screen [facility] 2/9 01/12/2020 07/17/2019  Decreased Interest 0 0  Down, Depressed, Hopeless 0 0  PHQ - 2 Score 0 0  Altered sleeping - 1  Tired, decreased energy - 0  Change in appetite - 0  Feeling bad or failure about yourself  - 0  Trouble concentrating - 0  Moving slowly or fidgety/restless - 0  Suicidal thoughts - 0  PHQ-9 Score - 1  Difficult doing work/chores - Not difficult at all   Patient has failed these meds in past: none reported Patient is currently controlled on the following medications:  . Bupropion SR 150 mg bid . Citalopram 40 mg daily   We discussed:  Patient states that she is well controlled on current regimen. Denies concerns or adverse effects.  Plan  Continue current medications  GERD   Patient has failed these meds in past: omeprazole, esomeprazole Patient is currently controlled on the following medications:  . Lansoprazole 30 mg daily  We discussed: Rotates through PPIs due to breakthrough in symptoms.  Currently using lansoprazole from [employer]. Loves coffee and spicy foods and would hate to give it up. Rarely experiences breakthrough symptoms but typically changes medications at that time. Discussed avoiding tight waistbands, elevating head of bed at night and  eating small meals as a way to help prevent breakthrough symptoms.   Patient reports lifelong issue with lactose intolerance. She drinks almond milk and oat milk instead of regular milk.   Plan  Continue current medications   Restless Leg Syndrome   Patient has failed these meds in past: none reported Patient is currently controlled on the following medications:  . Pramipexole 0.5 mg daily at bedtime  We discussed:  Reports that restless legs are better with treatment. Takes 1 hour prior to bed. Reports symptoms of RLS are not regular or routine. Denies concern at this time.   Plan  Continue current medications  Normal Bone Scan   Last DEXA Scan: 03/20/2019  T-Score lumbar spine: -0.7  T-Score forearm radius: 0.8   No results found for: VD25OH   Patient is currently controlled on the following medications:   Calcium 600 mg with D daily   We discussed:  Recommend [phone] units of vitamin D daily. Recommend 1200 mg of calcium daily from dietary and supplemental sources.  Diet: Patient is lactose intolerant. Drinks almond milk and oat milk. Gets variety of calcium in diet regularly. She is able to eat cheese just doesn't drink regular milk.   Plan  Continue current medications and control with diet and exercise   Health Maintenance   Patient is currently controlled on the following medications:  Marland Kitchen Multivitamin daily - supplementation  We discussed:  Patient denies concerns with her medications at this time. She reports that she is unable to hold her bladder as well as she used to but would like to avoid medication management. We discussed exercises to strengthen muscles and improve ability to hold urine.   Patient  uses CPAP every night. She has not been receiving her supplies through the mail since [place]. Pharmacist reached out to [employer] and determined that patient needs updated order, chart notes and demographic sheet sent to [place] [phone]). Requested orders from nursing 01/12/20.   Plan  Continue current medications  Vaccines   Reviewed and discussed patient's vaccination history.  Patient had a mild case of shingles. Reports having shingles vaccine after that time. Had a tetanus shot in 2019 after being bit by a cat.   Immunization History  Administered Date(s) Administered  . Fluad Quad(high Dose 65+) 10/21/2019  . Influenza, High Dose Seasonal PF 10/27/2018  . Influenza-Unspecified 10/09/2018  . Moderna SARS-COV2 Booster Vaccination 12/08/2019  . Moderna SARS-COVID-2 Vaccination 02/25/2019, 03/23/2019  . Pneumococcal Conjugate-13 12/21/2013  . Pneumococcal Polysaccharide-23 08/29/2010    Plan  Recommended patient receive annual flu vaccine in office.   Medication Management   Patient's preferred pharmacy is:  [facility], [place] [address] Phone: [phone] Fax: [phone]  Uses pill box? No - turns a bottle upside down when has taken it as a visual reminder. Keeps medicines together on her shelf in the order they're used. She sets up morning ones when she gets out night ones.   Pt endorses 100% compliance  We discussed: Current pharmacy is preferred with insurance plan and patient is satisfied with pharmacy services. 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 She denies problems getting medications or with costs of medications.   Plan  Continue current medication management strategy    Follow up: 6 month phone visit

## 2020-01-08 ENCOUNTER — Telehealth: Payer: Self-pay

## 2020-01-08 NOTE — Chronic Care Management (AMB) (Signed)
    Chronic Care Management Pharmacy Assistant   Name: Connie Marks  MRN: [id number] DOB: 1945-07-19  Reason for Encounter: Medication Review -Initial Questions for pharmacist visit 01/12/2020.  Have you seen any other providers since your last visit? Yes Referred to Ortho 10/21/2019 Blane Ohara, MD (PCP) 09/02/2019 Derrel Nip (Ophthalmology)   Any changes in your medications or health? No  Any side effects from any medications? None  Do you have an symptoms or problems not managed by your medications? Yes Patient  stated she has been experiencing urinary frequency,  Any concerns about your health right now? Yes, Patient  stated she was concerned about urinary frequency   Has your provider asked that you check blood pressure, blood sugar, or follow special diet at home? Patient stated she does not check at home. She does watch her diet.  Do you get any type of exercise on a regular basis? Patient stated she will do more exercising once her right foot starts to feel better.  Can you think of a goal you would like to reach for your health? Patient  stated she wanted to lose 30 pounds.  Do you have any problems getting your medications? Pt stated she does not have problems getting her medication with her pharmacy.  Is there anything that you would like to discuss during the appointment? Patient  uses a CPAP machine and stated she was  is having a hard time getting supplies.  Patient stated that she never started having problems with delivery until Covid. Patient did not have name or number of supplier, but she will find the information before appointment .   Patient is aware to have all medications and supplements available at time of   appointment    PCP : Blane Ohara, MD  Allergies:   Allergies  Allergen Reactions  . Adhesive [Tape]     Red whelps     Medications: Outpatient Encounter Medications as of 01/08/2020  Medication Sig  . buPROPion (WELLBUTRIN SR) 150  MG 12 hr tablet tablet  . citalopram (CELEXA) 40 MG tablet tablet  . LOTEMAX 0.5 % ophthalmic suspension   . Multiple Vitamins-Minerals (MULTIVITAL PO) Take by mouth.  . Omeprazole (PRILOSEC PO) Take 20 mg by mouth every morning.   . pramipexole (MIRAPEX) 0.5 MG tablet Take 1 tablet (0.5 mg total) by mouth at bedtime.  . simvastatin (ZOCOR) 20 MG tablet Take 1 tablet (20 mg total) by mouth daily.   No facility-administered encounter medications on file as of 01/08/2020.    Current Diagnosis: Patient Active Problem List   Diagnosis Date Noted  . Prediabetes 07/17/2019  . Toe pain, right 07/17/2019  . Class 1 obesity due to excess calories with serious comorbidity and body mass index (BMI) of 34.0 to 34.9 in adult 07/17/2019  . RLS (restless legs syndrome) 07/17/2019  . Hyperlipidemia   . Depression   . GERD (gastroesophageal reflux disease)   . Contusion of left great toe without damage to nail 04/03/2019  . Closed blow-out fracture of right orbital floor (HCC) 12/19/2018      Follow-Up:  Pharmacist Review   Huntley Dec B. Manson Passey, CPP. Notified,  Jon Gills, [facility] Clinical Pharmacist Assistant [phone]

## 2020-01-12 ENCOUNTER — Other Ambulatory Visit: Payer: Self-pay

## 2020-01-12 ENCOUNTER — Ambulatory Visit: Payer: Medicare Other

## 2020-01-12 DIAGNOSIS — I10 Essential (primary) hypertension: Secondary | ICD-10-CM

## 2020-01-12 DIAGNOSIS — E782 Mixed hyperlipidemia: Secondary | ICD-10-CM

## 2020-01-12 MED ORDER — VALACYCLOVIR HCL 1 G PO TABS
2000.0000 mg | ORAL_TABLET | Freq: Two times a day (BID) | ORAL | 0 refills | Status: DC
Start: 2020-01-12 — End: 2021-02-23

## 2020-01-12 NOTE — Patient Instructions (Addendum)
Visit Information  Thank you for your time discussing your medications. I look forward to working with you to achieve your health care goals. Below is a summary of what we talked about during our visit.   Goals Addressed            This Visit's Progress   . Pharmacy Care Plan       CARE PLAN ENTRY (see longitudinal plan of care for additional care plan information)  Current Barriers:  . Chronic Disease Management support, education, and care coordination needs related to Hyperlipidemia, GERD, and Depression  Hyperlipidemia Lab Results  Component Value Date/Time   LDLCALC 100 (H) 10/21/2019 09:09 AM   . Pharmacist Clinical Goal(s): o Over the next 90 days, patient will work with PharmD and providers to maintain LDL goal < 100 . Current regimen:  o Simvastatin 20 mg daily  . Interventions: o Discussed heart healthy diet and providing handout via mail with more information.  o Reviewed exercise at home and encouraged continuing to work with physical therapy to improve ability of riding exercise bike.  o Reviewed medications and adherence.  . Patient self care activities - Over the next 90 days, patient will: o Continue to take medications as prescribed.   Prediabetes Lab Results  Component Value Date/Time   HGBA1C 5.9 (H) 10/21/2019 09:09 AM   HGBA1C 6.0 (H) 07/17/2019 12:00 AM   . Pharmacist Clinical Goal(s): o Over the next 90 days, patient will work with PharmD and providers to maintain A1c goal <6.5% . Current regimen:  o Diet and lifestyle . Interventions: o Encouraged continuing to increase exercise as tolerated with physical therapy and exercise bike.  o Discussed continuing to make healthy diet choices.  . Patient self care activities - Over the next 0- days, patient will: o Continue managing blood sugar with healthy lifestyle choices.   GERD . Pharmacist Clinical Goal(s) o Over the next 90 days, patient will work with PharmD and providers to manage symptoms of  GERD . Current regimen:  o Lansoprazole 30 mg daily  . Interventions: o Reviewed current medications and history of symptoms.  o Discussed loose waistbands, small meals and elevating head of bed to minimize symptoms.  . Patient self care activities - Over the next 90 days, patient will: o Work to reduce breakthrough symptoms with lifestyle modifications.   Depression . Pharmacist Clinical Goal(s) o Over the next 90 days, patient will work with PharmD and providers to maintain good control of depression symptoms.  . Current regimen:  o Bupropion SR 150 mg bid o Citalopram 40 mg daily  . Interventions: o Discussed patient's history of depression. o Patient pleased with current management of symptoms and denies concerns.  . Patient self care activities - Over the next 90 days, patient will: o Continue taking medication as prescribed.   Medication management . Pharmacist Clinical Goal(s): o Over the next 90 days, patient will work with PharmD and providers to maintain optimal medication adherence . Current pharmacy: [facility] . Interventions o Comprehensive medication review performed. o Continue current medication management strategy . Patient self care activities - Over the next 90 days, patient will: o Focus on medication adherence by taking medication as prescribed and continue using system  o Take medications as prescribed o Report any questions or concerns to PharmD and/or provider(s)  Initial goal documentation        Connie Marks was given information about Chronic Care Management services today including:  1. CCM service  includes personalized support from designated clinical staff supervised by her physician, including individualized plan of care and coordination with other care providers 2. 24/7 contact phone numbers for assistance for urgent and routine care needs. 3. Standard insurance, coinsurance, copays and deductibles apply for chronic care  management only during months in which we provide at least 20 minutes of these services. Most insurances cover these services at 100%, however patients may be responsible for any copay, coinsurance and/or deductible if applicable. This service may help you avoid the need for more expensive face-to-face services. 4. Only one practitioner may furnish and bill the service in a calendar month. 5. The patient may stop CCM services at any time (effective at the end of the month) by phone call to the office staff.  Patient agreed to services and verbal consent obtained.   The patient verbalized understanding of instructions, educational materials, and care plan provided today and agreed to receive a mailed copy of patient instructions, educational materials, and care plan.  Telephone follow up appointment with pharmacy team member scheduled for: 07/2020  Juliane Lack, PharmD Clinical Pharmacist [facility] [phone] (office) [phone] (mobile)  Kegel Exercises  Kegel exercises can help strengthen your pelvic floor muscles. The pelvic floor is a group of muscles that support your rectum, small intestine, and bladder. In females, pelvic floor muscles also help support the womb (uterus). These muscles help you control the flow of urine and stool. Kegel exercises are painless and simple, and they do not require any equipment. Your provider may suggest Kegel exercises to:  Improve bladder and bowel control.  Improve sexual response.  Improve weak pelvic floor muscles after surgery to remove the uterus (hysterectomy) or pregnancy (females).  Improve weak pelvic floor muscles after prostate gland removal or surgery (males). Kegel exercises involve squeezing your pelvic floor muscles, which are the same muscles you squeeze when you try to stop the flow of urine or keep from passing gas. The exercises can be done while sitting, standing, or lying down, but it is best to vary your  position. Exercises How to do Kegel exercises: 1. Squeeze your pelvic floor muscles tight. You should feel a tight lift in your rectal area. If you are a female, you should also feel a tightness in your vaginal area. Keep your stomach, buttocks, and legs relaxed. 2. Hold the muscles tight for up to 10 seconds. 3. Breathe normally. 4. Relax your muscles. 5. Repeat as told by your health care provider. Repeat this exercise daily as told by your health care provider. Continue to do this exercise for at least 4-6 weeks, or for as long as told by your health care provider. You may be referred to a physical therapist who can help you learn more about how to do Kegel exercises. Depending on your condition, your health care provider may recommend:  Varying how long you squeeze your muscles.  Doing several sets of exercises every day.  Doing exercises for several weeks.  Making Kegel exercises a part of your regular exercise routine. This information is not intended to replace advice given to you by your health care provider. Make sure you discuss any questions you have with your health care provider. Document Revised: 09/11/2017 Document Reviewed: 09/11/2017 Elsevier Patient Education  2020 [employer]  Heart-Healthy Eating Plan Heart-healthy meal planning includes:  Eating less unhealthy fats.  Eating more healthy fats.  Making other changes in your diet. Talk with your doctor or a diet specialist (dietitian) to  create an eating plan that is right for you. What is my plan? Your doctor may recommend an eating plan that includes:  Total fat: ______% or less of total calories a day.  Saturated fat: ______% or less of total calories a day.  Cholesterol: less than _________mg a day. What are tips for following this plan? Cooking Avoid frying your food. Try to bake, boil, grill, or broil it instead. You can also reduce fat by:  Removing the skin from poultry.  Removing all visible  fats from meats.  Steaming vegetables in water or broth. Meal planning   At meals, divide your plate into four equal parts: ? Fill one-half of your plate with vegetables and green salads. ? Fill one-fourth of your plate with whole grains. ? Fill one-fourth of your plate with lean protein foods.  Eat 4-5 servings of vegetables per day. A serving of vegetables is: ? 1 cup of raw or cooked vegetables. ? 2 cups of raw leafy greens.  Eat 4-5 servings of fruit per day. A serving of fruit is: ? 1 medium whole fruit. ?  cup of dried fruit. ?  cup of fresh, frozen, or canned fruit. ?  cup of 100% fruit juice.  Eat more foods that have soluble fiber. These are apples, broccoli, carrots, beans, peas, and barley. Try to get 20-30 g of fiber per day.  Eat 4-5 servings of nuts, legumes, and seeds per week: ? 1 serving of dried beans or legumes equals  cup after being cooked. ? 1 serving of nuts is  cup. ? 1 serving of seeds equals 1 tablespoon. General information  Eat more home-cooked food. Eat less restaurant, buffet, and fast food.  Limit or avoid alcohol.  Limit foods that are high in starch and sugar.  Avoid fried foods.  Lose weight if you are overweight.  Keep track of how much salt (sodium) you eat. This is important if you have high blood pressure. Ask your doctor to tell you more about this.  Try to add vegetarian meals each week. Fats  Choose healthy fats. These include olive oil and canola oil, flaxseeds, walnuts, almonds, and seeds.  Eat more omega-3 fats. These include salmon, mackerel, sardines, tuna, flaxseed oil, and ground flaxseeds. Try to eat fish at least 2 times each week.  Check food labels. Avoid foods with trans fats or high amounts of saturated fat.  Limit saturated fats. ? These are often found in animal products, such as meats, butter, and cream. ? These are also found in plant foods, such as palm oil, palm kernel oil, and coconut oil.  Avoid  foods with partially hydrogenated oils in them. These have trans fats. Examples are stick margarine, some tub margarines, cookies, crackers, and other baked goods. What foods can I eat? Fruits All fresh, canned (in natural juice), or frozen fruits. Vegetables Fresh or frozen vegetables (raw, steamed, roasted, or grilled). Green salads. Grains Most grains. Choose whole wheat and whole grains most of the time. Rice and pasta, including [place] rice and pastas made with whole wheat. Meats and other proteins Lean, well-trimmed beef, veal, pork, and lamb. Chicken and [place] without skin. All fish and shellfish. Wild duck, rabbit, pheasant, and venison. Egg whites or low-cholesterol egg substitutes. Dried beans, peas, lentils, and tofu. Seeds and most nuts. Dairy Low-fat or nonfat cheeses, including ricotta and mozzarella. Skim or 1% milk that is liquid, powdered, or evaporated. Buttermilk that is made with low-fat milk. Nonfat or low-fat yogurt. Fats and  oils Non-hydrogenated (trans-free) margarines. Vegetable oils, including soybean, sesame, sunflower, olive, peanut, safflower, corn, canola, and cottonseed. Salad dressings or mayonnaise made with a vegetable oil. Beverages Mineral water. Coffee and tea. Diet carbonated beverages. Sweets and desserts Sherbet, gelatin, and fruit ice. Small amounts of dark chocolate. Limit all sweets and desserts. Seasonings and condiments All seasonings and condiments. The items listed above may not be a complete list of foods and drinks you can eat. Contact a dietitian for more options. What foods should I avoid? Fruits Canned fruit in heavy syrup. Fruit in cream or butter sauce. Fried fruit. Limit coconut. Vegetables Vegetables cooked in cheese, cream, or butter sauce. Fried vegetables. Grains Breads that are made with saturated or trans fats, oils, or whole milk. Croissants. Sweet rolls. Donuts. High-fat crackers, such as cheese crackers. Meats and other  proteins Fatty meats, such as hot dogs, ribs, sausage, bacon, rib-eye roast or steak. High-fat deli meats, such as salami and bologna. Caviar. Domestic duck and goose. Organ meats, such as liver. Dairy Cream, sour cream, cream cheese, and creamed cottage cheese. Whole-milk cheeses. Whole or 2% milk that is liquid, evaporated, or condensed. Whole buttermilk. Cream sauce or high-fat cheese sauce. Yogurt that is made from whole milk. Fats and oils Meat fat, or shortening. Cocoa butter, hydrogenated oils, palm oil, coconut oil, palm kernel oil. Solid fats and shortenings, including bacon fat, salt pork, lard, and butter. Nondairy cream substitutes. Salad dressings with cheese or sour cream. Beverages Regular sodas and juice drinks with added sugar. Sweets and desserts Frosting. Pudding. Cookies. Cakes. Pies. Milk chocolate or white chocolate. Buttered syrups. Full-fat ice cream or ice cream drinks. The items listed above may not be a complete list of foods and drinks to avoid. Contact a dietitian for more information. Summary  Heart-healthy meal planning includes eating less unhealthy fats, eating more healthy fats, and making other changes in your diet.  Eat a balanced diet. This includes fruits and vegetables, low-fat or nonfat dairy, lean protein, nuts and legumes, whole grains, and heart-healthy oils and fats. This information is not intended to replace advice given to you by your health care provider. Make sure you discuss any questions you have with your health care provider. Document Revised: 03/28/2017 Document Reviewed: 03/01/2017 Elsevier Patient Education  2020 [employer] Inc.  Heart-Healthy Eating Plan Many factors influence your heart (coronary) health, including eating and exercise habits. Coronary risk increases with abnormal blood fat (lipid) levels. Heart-healthy meal planning includes limiting unhealthy fats, increasing healthy fats, and making other diet and lifestyle  changes. What is my plan? Your health care provider may recommend that you:  Limit your fat intake to _________% or less of your total calories each day.  Limit your saturated fat intake to _________% or less of your total calories each day.  Limit the amount of cholesterol in your diet to less than _________ mg per day. What are tips for following this plan? Cooking Cook foods using methods other than frying. Baking, boiling, grilling, and broiling are all good options. Other ways to reduce fat include:  Removing the skin from poultry.  Removing all visible fats from meats.  Steaming vegetables in water or broth. Meal planning   At meals, imagine dividing your plate into fourths: ? Fill one-half of your plate with vegetables and green salads. ? Fill one-fourth of your plate with whole grains. ? Fill one-fourth of your plate with lean protein foods.  Eat 4-5 servings of vegetables per day. One serving equals 1  cup raw or cooked vegetable, or 2 cups raw leafy greens.  Eat 4-5 servings of fruit per day. One serving equals 1 medium whole fruit,  cup dried fruit,  cup fresh, frozen, or canned fruit, or  cup 100% fruit juice.  Eat more foods that contain soluble fiber. Examples include apples, broccoli, carrots, beans, peas, and barley. Aim to get 25-30 g of fiber per day.  Increase your consumption of legumes, nuts, and seeds to 4-5 servings per week. One serving of dried beans or legumes equals  cup cooked, 1 serving of nuts is  cup, and 1 serving of seeds equals 1 tablespoon. Fats  Choose healthy fats more often. Choose monounsaturated and polyunsaturated fats, such as olive and canola oils, flaxseeds, walnuts, almonds, and seeds.  Eat more omega-3 fats. Choose salmon, mackerel, sardines, tuna, flaxseed oil, and ground flaxseeds. Aim to eat fish at least 2 times each week.  Check food labels carefully to identify foods with trans fats or high amounts of saturated  fat.  Limit saturated fats. These are found in animal products, such as meats, butter, and cream. Plant sources of saturated fats include palm oil, palm kernel oil, and coconut oil.  Avoid foods with partially hydrogenated oils in them. These contain trans fats. Examples are stick margarine, some tub margarines, cookies, crackers, and other baked goods.  Avoid fried foods. General information  Eat more home-cooked food and less restaurant, buffet, and fast food.  Limit or avoid alcohol.  Limit foods that are high in starch and sugar.  Lose weight if you are overweight. Losing just 5-10% of your body weight can help your overall health and prevent diseases such as diabetes and heart disease.  Monitor your salt (sodium) intake, especially if you have high blood pressure. Talk with your health care provider about your sodium intake.  Try to incorporate more vegetarian meals weekly. What foods can I eat? Fruits All fresh, canned (in natural juice), or frozen fruits. Vegetables Fresh or frozen vegetables (raw, steamed, roasted, or grilled). Green salads. Grains Most grains. Choose whole wheat and whole grains most of the time. Rice and pasta, including Connie Marks rice and pastas made with whole wheat. Meats and other proteins Lean, well-trimmed beef, veal, pork, and lamb. Chicken and [place] without skin. All fish and shellfish. Wild duck, rabbit, pheasant, and venison. Egg whites or low-cholesterol egg substitutes. Dried beans, peas, lentils, and tofu. Seeds and most nuts. Dairy Low-fat or nonfat cheeses, including ricotta and mozzarella. Skim or 1% milk (liquid, powdered, or evaporated). Buttermilk made with low-fat milk. Nonfat or low-fat yogurt. Fats and oils Non-hydrogenated (trans-free) margarines. Vegetable oils, including soybean, sesame, sunflower, olive, peanut, safflower, corn, canola, and cottonseed. Salad dressings or mayonnaise made with a vegetable oil. Beverages Water (mineral  or sparkling). Coffee and tea. Diet carbonated beverages. Sweets and desserts Sherbet, gelatin, and fruit ice. Small amounts of dark chocolate. Limit all sweets and desserts. Seasonings and condiments All seasonings and condiments. The items listed above may not be a complete list of foods and beverages you can eat. Contact a dietitian for more options. What foods are not recommended? Fruits Canned fruit in heavy syrup. Fruit in cream or butter sauce. Fried fruit. Limit coconut. Vegetables Vegetables cooked in cheese, cream, or butter sauce. Fried vegetables. Grains Breads made with saturated or trans fats, oils, or whole milk. Croissants. Sweet rolls. Donuts. High-fat crackers, such as cheese crackers. Meats and other proteins Fatty meats, such as hot dogs, ribs, sausage, bacon, rib-eye roast  or steak. High-fat deli meats, such as salami and bologna. Caviar. Domestic duck and goose. Organ meats, such as liver. Dairy Cream, sour cream, cream cheese, and creamed cottage cheese. Whole milk cheeses. Whole or 2% milk (liquid, evaporated, or condensed). Whole buttermilk. Cream sauce or high-fat cheese sauce. Whole-milk yogurt. Fats and oils Meat fat, or shortening. Cocoa butter, hydrogenated oils, palm oil, coconut oil, palm kernel oil. Solid fats and shortenings, including bacon fat, salt pork, lard, and butter. Nondairy cream substitutes. Salad dressings with cheese or sour cream. Beverages Regular sodas and any drinks with added sugar. Sweets and desserts Frosting. Pudding. Cookies. Cakes. Pies. Milk chocolate or white chocolate. Buttered syrups. Full-fat ice cream or ice cream drinks. The items listed above may not be a complete list of foods and beverages to avoid. Contact a dietitian for more information. Summary  Heart-healthy meal planning includes limiting unhealthy fats, increasing healthy fats, and making other diet and lifestyle changes.  Lose weight if you are overweight. Losing  just 5-10% of your body weight can help your overall health and prevent diseases such as diabetes and heart disease.  Focus on eating a balance of foods, including fruits and vegetables, low-fat or nonfat dairy, lean protein, nuts and legumes, whole grains, and heart-healthy oils and fats. This information is not intended to replace advice given to you by your health care provider. Make sure you discuss any questions you have with your health care provider. Document Revised: 03/01/2017 Document Reviewed: 03/01/2017 Elsevier Patient Education  2020 [employer].

## 2020-01-20 ENCOUNTER — Encounter: Payer: Self-pay | Admitting: Family Medicine

## 2020-01-20 ENCOUNTER — Ambulatory Visit: Payer: Medicare Other

## 2020-01-20 ENCOUNTER — Ambulatory Visit (INDEPENDENT_AMBULATORY_CARE_PROVIDER_SITE_OTHER): Payer: Medicare Other | Admitting: Family Medicine

## 2020-01-20 ENCOUNTER — Other Ambulatory Visit: Payer: Self-pay

## 2020-01-20 VITALS — BP 138/74 | HR 72 | Temp 97.5°F | Resp 18 | Ht 64.5 in | Wt 212.6 lb

## 2020-01-20 DIAGNOSIS — Z13820 Encounter for screening for osteoporosis: Secondary | ICD-10-CM | POA: Insufficient documentation

## 2020-01-20 DIAGNOSIS — Z0000 Encounter for general adult medical examination without abnormal findings: Secondary | ICD-10-CM | POA: Diagnosis not present

## 2020-01-20 NOTE — Progress Notes (Signed)
Subjective:    Connie Marks is a 74 y.o. female who presents for Medicare Annual/Subsequent preventive examination.  Preventive Screening-Counseling & Management  Tobacco Social History   Tobacco Use  Smoking Status Former Smoker  . Quit date: 12/18/1985  . Years since quitting: 34.1  Smokeless Tobacco Never Used     Problems Prior to Visit 1.   Current Problems (verified) Patient Active Problem List   Diagnosis Date Noted  . Prediabetes 07/17/2019  . Toe pain, right 07/17/2019  . Class 1 obesity due to excess calories with serious comorbidity and body mass index (BMI) of 34.0 to 34.9 in adult 07/17/2019  . RLS (restless legs syndrome) 07/17/2019  . Hyperlipidemia   . Depression   . GERD (gastroesophageal reflux disease)   . Contusion of left great toe without damage to nail 04/03/2019  . Closed fracture of right orbital floor (HCC) 12/08/2018    Medications Prior to Visit Current Outpatient Medications on File Prior to Visit  Medication Sig Dispense Refill  . buPROPion (WELLBUTRIN SR) 150 MG 12 hr tablet tablet (Patient taking differently: Take 150 mg by mouth 2 (two) times daily. tablet) 180 tablet 0  . Calcium Carbonate+Vitamin D 600-200 MG-UNIT TABS Take 1 tablet by mouth daily.    . citalopram (CELEXA) 40 MG tablet tablet (Patient taking differently: Take 40 mg by mouth in the morning. tablet) 90 tablet 0  . ibuprofen (ADVIL) 200 MG tablet Take 200 mg by mouth every 6 (six) hours as needed. Uses for arthritis pain    . lansoprazole (PREVACID) 30 MG capsule Take 30 mg by mouth daily at 12 noon.    . Multiple Vitamins-Minerals (MULTIVITAL PO) Take by mouth.    Bertram Gala Glycol-Propyl Glycol (SYSTANE) 0.4-0.3 % SOLN Apply 1 drop to eye daily as needed.    . simvastatin (ZOCOR) 20 MG tablet Take 1 tablet (20 mg total) by mouth daily. 90 tablet 0  . valACYclovir (VALTREX) 1000 MG tablet Take 2 tablets (2,000 mg total) by mouth 2 (two) times daily. 2 tablet bid at  onset 24 tablet 0   No current facility-administered medications on file prior to visit.    Current Medications (verified) Current Outpatient Medications  Medication Sig Dispense Refill  . buPROPion (WELLBUTRIN SR) 150 MG 12 hr tablet tablet (Patient taking differently: Take 150 mg by mouth 2 (two) times daily. tablet) 180 tablet 0  . Calcium Carbonate+Vitamin D 600-200 MG-UNIT TABS Take 1 tablet by mouth daily.    . citalopram (CELEXA) 40 MG tablet tablet (Patient taking differently: Take 40 mg by mouth in the morning. tablet) 90 tablet 0  . ibuprofen (ADVIL) 200 MG tablet Take 200 mg by mouth every 6 (six) hours as needed. Uses for arthritis pain    . lansoprazole (PREVACID) 30 MG capsule Take 30 mg by mouth daily at 12 noon.    . Multiple Vitamins-Minerals (MULTIVITAL PO) Take by mouth.    Bertram Gala Glycol-Propyl Glycol (SYSTANE) 0.4-0.3 % SOLN Apply 1 drop to eye daily as needed.    . simvastatin (ZOCOR) 20 MG tablet Take 1 tablet (20 mg total) by mouth daily. 90 tablet 0  . valACYclovir (VALTREX) 1000 MG tablet Take 2 tablets (2,000 mg total) by mouth 2 (two) times daily. 2 tablet bid at onset 24 tablet 0   No current facility-administered medications for this visit.     Allergies (verified) Adhesive [tape]   PAST HISTORY  Family History No family history on file.  Social History  Social History   Tobacco Use  . Smoking status: Former Smoker    Quit date: 12/18/1985    Years since quitting: 34.1  . Smokeless tobacco: Never Used  Substance Use Topics  . Alcohol use: Yes    Alcohol/week: 3.0 standard drinks    Types: 2 Glasses of wine, 1 Shots of liquor per week    Comment: 2 glasses /week     Are there smokers in your home (other than you)?  Pt not a smoker-no one in household smokes Risk Factors Current exercise habits: no regular exercise Dietary issues discussed:   Cardiac risk factors: hyperlipidemia  Depression Screen PHQ9-0 Activities of Daily  Living In your present state of health, do you have any difficulty performing the following activities?:  Driving?  No difficulty-double vision-eye specialist cleared to drive Managing money? No difficulty Feeding yourself? No difficulty Getting from bed to chair? No difficulty Climbing a flight of stairs? Uses handrail, coming down difficulty Preparing food and eating?: no difficulty Bathing or showering? No difficulty Getting dressed: No difficulty Getting to the toilet? No difficulty Using the toilet: No difficulty Moving around from place to place: No difficulty In the past year have you fallen or had a near fall?: No fall this year-pt fell 10/30-pt with facial fracture   Lives alone    Hearing Difficulties: no concerns  Do you feel that you have a problem with memory?   Slower to retrieve names  Do you feel safe at home? yes  Cognitive Testing  No concerns  Advanced Directives have been discussed with the patient?  No  List the Names of Other Physician/Practitioners you currently use: 1.  Dr. Sedalia Muta 2.  Dr. Dione Booze 3.  Dr. Regino Schultze 4. Dr. Earlie Counts  Immunization History  Administered Date(s) Administered  . Fluad Quad(high Dose 65+) 10/21/2019  . Influenza, High Dose Seasonal PF 10/27/2018  . Influenza-Unspecified 10/09/2018  . Moderna SARS-COV2 Booster Vaccination 12/08/2019  . Moderna Sars-Covid-2 Vaccination 02/25/2019, 03/23/2019  . Pneumococcal Conjugate-13 12/21/2013  . Pneumococcal Polysaccharide-23 08/29/2010    Screening Tests Health Maintenance  Topic Date Due  . Hepatitis C Screening  Never done  . TETANUS/TDAP  Never done  . COLONOSCOPY  Never done  . PNA vac Low Risk Adult (2 of 2 - PPSV23) 08/29/2015  . MAMMOGRAM  03/24/2021  . DEXA SCAN  03/24/2021  . INFLUENZA VACCINE  Completed  . COVID-19 Vaccine  Completed    All answers were reviewed with the patient and necessary referrals were made:  Georga Kaufmann, MD   01/20/2020      Objective:      Vision by Snellen chart: right eye 20/40, left 20/32, bilat 20/40:Body mass index is 35.93 kg/m. BP 138/74   Pulse 72   Temp (!) 97.5 F (36.4 C)   Resp 18   Ht 5' 4.5" (1.638 m)   Wt 212 lb 9.6 oz (96.4 kg)   SpO2 98%   BMI 35.93 kg/m       Assessment:  1. Medicare annual wellness visit, subsequent   Plan:    During the course of the visit the patient was educated and counseled about appropriate screening and preventive services including:   Patient Instructions (the written plan) was given to the patient.  Medicare Attestation I have personally reviewed: The patient's medical and social history Their use of alcohol, tobacco or illicit drugs Their current medications and supplements The patient's functional ability including ADLs,fall risks, home safety risks, cognitive, and hearing and  visual impairment Diet and physical activities Evidence for depression or mood disorders  The patient's weight, height, BMI, and visual acuity have been recorded in the chart.  I have made referrals, counseling, and provided education to the patient based on review of the above and I have provided the patient with a written personalized care plan for preventive services.     Georga Kaufmann, MD   01/20/2020

## 2020-01-20 NOTE — Patient Instructions (Signed)
List the Names of Other Physician/Practitioners you currently use: 1.  Dr. Sedalia Muta 2.  Dr. Dione Booze 3.  Dr. Regino Schultze 4. Dr. Earlie Counts   Ms. Shanley , Thank you for taking time to come for your Medicare Wellness Visit. I appreciate your ongoing commitment to your health goals. Please review the following plan we discussed and let me know if I can assist you in the future.   These are the goals we discussed: Goals    . Pharmacy Care Plan     CARE PLAN ENTRY (see longitudinal plan of care for additional care plan information)  Current Barriers:  . Chronic Disease Management support, education, and care coordination needs related to Hyperlipidemia, GERD, and Depression  Hyperlipidemia Lab Results  Component Value Date/Time   LDLCALC 100 (H) 10/21/2019 09:09 AM   . Pharmacist Clinical Goal(s): o Over the next 90 days, patient will work with PharmD and providers to maintain LDL goal < 100 . Current regimen:  o Simvastatin 20 mg daily  . Interventions: o Discussed heart healthy diet and providing handout via mail with more information.  o Reviewed exercise at home and encouraged continuing to work with physical therapy to improve ability of riding exercise bike.  o Reviewed medications and adherence.  . Patient self care activities - Over the next 90 days, patient will: o Continue to take medications as prescribed.   Prediabetes Lab Results  Component Value Date/Time   HGBA1C 5.9 (H) 10/21/2019 09:09 AM   HGBA1C 6.0 (H) 07/17/2019 12:00 AM   . Pharmacist Clinical Goal(s): o Over the next 90 days, patient will work with PharmD and providers to maintain A1c goal <6.5% . Current regimen:  o Diet and lifestyle . Interventions: o Encouraged continuing to increase exercise as tolerated with physical therapy and exercise bike.  o Discussed continuing to make healthy diet choices.  . Patient self care activities - Over the next 0- days, patient will: o Continue managing blood sugar with  healthy lifestyle choices.   GERD . Pharmacist Clinical Goal(s) o Over the next 90 days, patient will work with PharmD and providers to manage symptoms of GERD . Current regimen:  o Lansoprazole 30 mg daily  . Interventions: o Reviewed current medications and history of symptoms.  o Discussed loose waistbands, small meals and elevating head of bed to minimize symptoms.  . Patient self care activities - Over the next 90 days, patient will: o Work to reduce breakthrough symptoms with lifestyle modifications.   Depression . Pharmacist Clinical Goal(s) o Over the next 90 days, patient will work with PharmD and providers to maintain good control of depression symptoms.  . Current regimen:  o Bupropion SR 150 mg bid o Citalopram 40 mg daily  . Interventions: o Discussed patient's history of depression. o Patient pleased with current management of symptoms and denies concerns.  . Patient self care activities - Over the next 90 days, patient will: o Continue taking medication as prescribed.   Medication management . Pharmacist Clinical Goal(s): o Over the next 90 days, patient will work with PharmD and providers to maintain optimal medication adherence . Current pharmacy: [facility] . Interventions o Comprehensive medication review performed. o Continue current medication management strategy . Patient self care activities - Over the next 90 days, patient will: o Focus on medication adherence by taking medication as prescribed and continue using system  o Take medications as prescribed o Report any questions or concerns to PharmD and/or provider(s)  Initial goal documentation  This is a list of the screening recommended for you and due dates:  Health Maintenance  Topic Date Due  .  Hepatitis C: One time screening is recommended by Center for Disease Control  (CDC) for  adults born from [birth year] through 1965.   Never done  . Tetanus Vaccine  Never done  . Colon  Cancer Screening  Never done  . Pneumonia vaccines (2 of 2 - PPSV23) 08/29/2015  . Mammogram  03/24/2021  . DEXA scan (bone density measurement)  03/24/2021  . Flu Shot  Completed  . COVID-19 Vaccine  Completed

## 2020-01-21 NOTE — Progress Notes (Signed)
Subjective:  Patient ID: Connie Marks, female    DOB: 06/11/1945  Age: 74 y.o. MRN: [id number]  Chief Complaint  Patient presents with   Hyperlipidemia   Sleep Apnea    HPI Pt presents with Depression- Denies any depressive symptoms. Current medications include Celexa and Wellbutrin SR.  Denies suicidal ideation.      Impaired fasting glucose. Maintains healthy diet, not exercising due to increase risk of falls. NO falls in the last year but her balance is not good. Vision is abnormal. Dr. Victorino Dike recommend recumbent bicycle and chair yoga.    Obstructive sleep apnea- Uses CPAP. She feels she needs more equipment. Benefits. Compliant with wearing all night.   Hyperlipidemia. Treatment includes Zocor. She takes her medication as directed, maintains her low cholesterol diet, follows up as directed.  GERD: on prevacid 30 mg once daily.   Calcium cabonate vitamin D and Vitamin D.     Current Outpatient Medications on File Prior to Visit  Medication Sig Dispense Refill   aspirin EC 81 MG tablet Take 81 mg by mouth daily. Swallow whole.     buPROPion (WELLBUTRIN SR) 150 MG 12 hr tablet tablet (Patient taking differently: Take 150 mg by mouth 2 (two) times daily. tablet) 180 tablet 0   Calcium Carbonate+Vitamin D 600-200 MG-UNIT TABS Take 1 tablet by mouth daily.     citalopram (CELEXA) 40 MG tablet tablet (Patient taking differently: Take 40 mg by mouth in the morning. tablet) 90 tablet 0   lansoprazole (PREVACID) 30 MG capsule Take 30 mg by mouth daily at 12 noon.     Multiple Vitamins-Minerals (MULTIVITAL PO) Take by mouth.     Polyethyl Glycol-Propyl Glycol (SYSTANE) 0.4-0.3 % SOLN Apply 1 drop to eye daily as needed.     simvastatin (ZOCOR) 20 MG tablet Take 1 tablet (20 mg total) by mouth daily. 90 tablet 0   valACYclovir (VALTREX) 1000 MG tablet Take 2 tablets (2,000 mg total) by mouth 2 (two) times daily. 2 tablet bid at onset 24 tablet 0   No current  facility-administered medications on file prior to visit.   Past Medical History:  Diagnosis Date   Anxiety    Depression    GERD (gastroesophageal reflux disease)    Hyperlipidemia    Prediabetes    Sleep apnea    uses CPAP every night   Past Surgical History:  Procedure Laterality Date   CESAREAN SECTION     DIAGNOSTIC LAPAROSCOPY     HERNIA REPAIR     JOINT REPLACEMENT     knee replacements bilat   JOINT REPLACEMENT     hip replacements bilat   ORIF ORBITAL FRACTURE Right 12/19/2018   Procedure: OPEN TREATMENT RIGHT ORBITAL FLOOR WITH IMPLANT, PERIORBITAL APPROACH;  Surgeon: Glenna Fellows, MD;  Location: [facility];  Service: Plastics;  Laterality: Right;   ORIF ORBITAL FRACTURE Right 02/03/2019   Procedure: OPEN TREATMENT RIGHT ORBITAL FLOOR FRACTURE REVISION, PERI ORBITAL APPROACH;  Surgeon: Glenna Fellows, MD;  Location: [facility];  Service: Plastics;  Laterality: Right;    No family history on file. Social History   Socioeconomic History   Marital status: Divorced    Spouse name: Not on file   Number of children: Not on file   Years of education: Not on file   Highest education level: Not on file  Occupational History   Not on file  Tobacco Use   Smoking status: Former Smoker    Quit date:  12/18/1985    Years since quitting: 34.1   Smokeless tobacco: Never Used  Vaping Use   Vaping Use: Never used  Substance and Sexual Activity   Alcohol use: Yes    Alcohol/week: 3.0 standard drinks    Types: 2 Glasses of wine, 1 Shots of liquor per week    Comment: 2 glasses /week   Drug use: Never   Sexual activity: Not on file  Other Topics Concern   Not on file  Social History Narrative   Not on file   Social Determinants of Health   Financial Resource Strain: Not on file  Food Insecurity: No Food Insecurity   Worried About Running Out of Food in the Last Year: Never true   Ran Out of Food in the  Last Year: Never true  Transportation Needs: No Transportation Needs   Lack of Transportation (Medical): No   Lack of Transportation (Non-Medical): No  Physical Activity: Not on file  Stress: Not on file  Social Connections: Not on file    Review of Systems  Constitutional: Negative for chills, fatigue and fever.  HENT: Negative for congestion, ear pain, rhinorrhea and sore throat.   Eyes: Positive for visual disturbance (double vision (under the care Dr. Dione Booze).).  Respiratory: Negative for cough and shortness of breath.   Cardiovascular: Negative for chest pain.  Gastrointestinal: Negative for abdominal pain, constipation, diarrhea, nausea and vomiting.  Genitourinary: Positive for urgency. Negative for dysuria.  Musculoskeletal: Negative for back pain and myalgias.  Neurological: Negative for dizziness, weakness, light-headedness and headaches.  Psychiatric/Behavioral: Negative for dysphoric mood. The patient is not nervous/anxious.      Objective:  BP 124/72    Pulse 76    Temp (!) 96.8 F (36 C)    Ht 5\' 5"  (1.651 m)    Wt 209 lb (94.8 kg)    BMI 34.78 kg/m   BP/Weight 01/22/2020 01/20/2020 10/21/2019  Systolic BP 124 138 134  Diastolic BP 72 74 82  Wt. (Lbs) 209 212.6 204  BMI 34.78 35.93 33.95    Physical Exam Vitals reviewed.  Constitutional:      Appearance: Normal appearance. She is normal weight.  Neck:     Vascular: No carotid bruit.  Cardiovascular:     Rate and Rhythm: Normal rate and regular rhythm.     Pulses: Normal pulses.     Heart sounds: Normal heart sounds.  Pulmonary:     Effort: Pulmonary effort is normal. No respiratory distress.     Breath sounds: Normal breath sounds.  Abdominal:     General: Abdomen is flat. Bowel sounds are normal.     Palpations: Abdomen is soft.     Tenderness: There is no abdominal tenderness.  Neurological:     Mental Status: She is alert and oriented to person, place, and time.  Psychiatric:        Mood and  Affect: Mood normal.        Behavior: Behavior normal.     Diabetic Foot Exam - Simple   No data filed      Lab Results  Component Value Date   WBC 8.1 10/21/2019   HGB 13.9 10/21/2019   HCT 43.4 10/21/2019   PLT 233 10/21/2019   GLUCOSE 93 10/21/2019   CHOL 178 10/21/2019   TRIG 109 10/21/2019   HDL 59 10/21/2019   LDLCALC 100 (H) 10/21/2019   ALT 17 10/21/2019   AST 16 10/21/2019   NA 140 10/21/2019   K 5.0  10/21/2019   CL 103 10/21/2019   CREATININE 1.10 (H) 10/21/2019   BUN 17 10/21/2019   CO2 23 10/21/2019   HGBA1C 5.9 (H) 10/21/2019      Assessment & Plan:   1. Essential hypertension Well controlled.  No changes to medicines.  Recommend low salt diet.  - Comprehensive metabolic panel  2. Mixed hyperlipidemia Await labs to consider any changes.  Continue to work on eating a healthy diet and Yoga and balance exercise.  Labs drawn today.  - Lipid panel  3. Gastroesophageal reflux disease without esophagitis Change prevacid to pepcid 40 mg once at night.  May take prevacid if break through symptoms. - famotidine (PEPCID) 40 MG tablet; Take 1 tablet (40 mg total) by mouth at bedtime.  Dispense: 30 tablet; Refill: 2  4. Prediabetes - CBC with Differential/Platelet - Hemoglobin A1c  5. OSA on CPAP Compliant and benefiting from cpap.  Needs new equipment.   7. Class 1 obesity due to excess calories with serious comorbidity and body mass index (BMI) of 34.0 to 34.9 in adult  Recommend continue to work on eating healthy diet and exercise (yoga).   Meds ordered this encounter  Medications   famotidine (PEPCID) 40 MG tablet    Sig: Take 1 tablet (40 mg total) by mouth at bedtime.    Dispense:  30 tablet    Refill:  2    Orders Placed This Encounter  Procedures   CBC with Differential/Platelet   Comprehensive metabolic panel   Hemoglobin A1c   Lipid panel     Follow-up: Return in about 3 months (around 04/21/2020).  An After Visit  Summary was printed and given to the patient.  Blane Ohara, MD [facility] [phone]

## 2020-01-22 ENCOUNTER — Encounter: Payer: Self-pay | Admitting: Family Medicine

## 2020-01-22 ENCOUNTER — Other Ambulatory Visit: Payer: Self-pay

## 2020-01-22 ENCOUNTER — Ambulatory Visit (INDEPENDENT_AMBULATORY_CARE_PROVIDER_SITE_OTHER): Payer: Medicare Other | Admitting: Family Medicine

## 2020-01-22 VITALS — BP 124/72 | HR 76 | Temp 96.8°F | Ht 65.0 in | Wt 209.0 lb

## 2020-01-22 DIAGNOSIS — G4733 Obstructive sleep apnea (adult) (pediatric): Secondary | ICD-10-CM | POA: Diagnosis not present

## 2020-01-22 DIAGNOSIS — R7303 Prediabetes: Secondary | ICD-10-CM

## 2020-01-22 DIAGNOSIS — I10 Essential (primary) hypertension: Secondary | ICD-10-CM | POA: Diagnosis not present

## 2020-01-22 DIAGNOSIS — Z6834 Body mass index (BMI) 34.0-34.9, adult: Secondary | ICD-10-CM

## 2020-01-22 DIAGNOSIS — E782 Mixed hyperlipidemia: Secondary | ICD-10-CM

## 2020-01-22 DIAGNOSIS — E6609 Other obesity due to excess calories: Secondary | ICD-10-CM

## 2020-01-22 DIAGNOSIS — Z9989 Dependence on other enabling machines and devices: Secondary | ICD-10-CM

## 2020-01-22 DIAGNOSIS — K219 Gastro-esophageal reflux disease without esophagitis: Secondary | ICD-10-CM | POA: Diagnosis not present

## 2020-01-22 MED ORDER — FAMOTIDINE 40 MG PO TABS: 40.0000 mg | ORAL_TABLET | Freq: Every day | ORAL | 2 refills | Status: DC

## 2020-01-22 NOTE — Patient Instructions (Addendum)
Start on pepcid 40 mg once daily at night.  Stop prevacid.  Recommend shingrix vaccine (2 dose series) Have fun at [facility]!

## 2020-01-22 NOTE — Progress Notes (Deleted)
Virtual Visit via Telephone Note   This visit type was conducted due to national recommendations for restrictions regarding the COVID-19 Pandemic (e.g. social distancing) in an effort to limit this patient's exposure and mitigate transmission in our community.  Due to her co-morbid illnesses, this patient is at least at moderate risk for complications without adequate follow up.  This format is felt to be most appropriate for this patient at this time.  The patient did not have access to video technology/had technical difficulties with video requiring transitioning to audio format only (telephone).  All issues noted in this document were discussed and addressed.  No physical exam could be performed with this format.  Patient verbally consented to a telehealth visit.   Date:  01/22/2020   ID:  Connie Marks, DOB May 17, 1945, MRN [id number]  {Patient [phone]::"Home"} {Provider [phone]::"Home Office"}  PCP:  Blane Ohara, MD   Evaluation Performed:  {Choose Visit Type:[phone]::"Follow-Up Visit"}  Chief Complaint:  ***  History of Present Illness:    Connie Marks is a 74 y.o. female with ***  The patient {does/does not:200015} have symptoms concerning for COVID-19 infection (fever, chills, cough, or new shortness of breath).    Past Medical History:  Diagnosis Date  . Anxiety   . Depression   . GERD (gastroesophageal reflux disease)   . Hyperlipidemia   . Prediabetes   . Sleep apnea    uses CPAP every night    Past Surgical History:  Procedure Laterality Date  . CESAREAN SECTION    . DIAGNOSTIC LAPAROSCOPY    . HERNIA REPAIR    . JOINT REPLACEMENT     knee replacements bilat  . JOINT REPLACEMENT     hip replacements bilat  . ORIF ORBITAL FRACTURE Right 12/19/2018   Procedure: OPEN TREATMENT RIGHT ORBITAL FLOOR WITH IMPLANT, PERIORBITAL APPROACH;  Surgeon: Glenna Fellows, MD;  Location: [facility];  Service: Plastics;   Laterality: Right;  . ORIF ORBITAL FRACTURE Right 02/03/2019   Procedure: OPEN TREATMENT RIGHT ORBITAL FLOOR FRACTURE REVISION, PERI ORBITAL APPROACH;  Surgeon: Glenna Fellows, MD;  Location:  SURGERY CENTER;  Service: Plastics;  Laterality: Right;    No family history on file.  Social History   Socioeconomic History  . Marital status: Divorced    Spouse name: Not on file  . Number of children: Not on file  . Years of education: Not on file  . Highest education level: Not on file  Occupational History  . Not on file  Tobacco Use  . Smoking status: Former Smoker    Quit date: 12/18/1985    Years since quitting: 34.1  . Smokeless tobacco: Never Used  Vaping Use  . Vaping Use: Never used  Substance and Sexual Activity  . Alcohol use: Yes    Alcohol/week: 3.0 standard drinks    Types: 2 Glasses of wine, 1 Shots of liquor per week    Comment: 2 glasses /week  . Drug use: Never  . Sexual activity: Not on file  Other Topics Concern  . Not on file  Social History Narrative  . Not on file   Social Determinants of Health   Financial Resource Strain: Not on file  Food Insecurity: No Food Insecurity  . Worried About Programme researcher, broadcasting/film/video in the Last Year: Never true  . Ran Out of Food in the Last Year: Never true  Transportation Needs: No Transportation Needs  . Lack of Transportation (Medical): No  . Lack of Transportation (  Non-Medical): No  Physical Activity: Not on file  Stress: Not on file  Social Connections: Not on file  Intimate Partner Violence: Not on file    Outpatient Medications Prior to Visit  Medication Sig Dispense Refill  . aspirin EC 81 MG tablet Take 81 mg by mouth daily. Swallow whole.    Marland Kitchen buPROPion (WELLBUTRIN SR) 150 MG 12 hr tablet tablet (Patient taking differently: Take 150 mg by mouth 2 (two) times daily. tablet) 180 tablet 0  . Calcium Carbonate+Vitamin D 600-200 MG-UNIT TABS Take 1 tablet by mouth daily.    . citalopram (CELEXA) 40 MG  tablet tablet (Patient taking differently: Take 40 mg by mouth in the morning. tablet) 90 tablet 0  . lansoprazole (PREVACID) 30 MG capsule Take 30 mg by mouth daily at 12 noon.    . Multiple Vitamins-Minerals (MULTIVITAL PO) Take by mouth.    Bertram Gala Glycol-Propyl Glycol (SYSTANE) 0.4-0.3 % SOLN Apply 1 drop to eye daily as needed.    . simvastatin (ZOCOR) 20 MG tablet Take 1 tablet (20 mg total) by mouth daily. 90 tablet 0  . valACYclovir (VALTREX) 1000 MG tablet Take 2 tablets (2,000 mg total) by mouth 2 (two) times daily. 2 tablet bid at onset 24 tablet 0  . ibuprofen (ADVIL) 200 MG tablet Take 200 mg by mouth every 6 (six) hours as needed. Uses for arthritis pain     No facility-administered medications prior to visit.    Allergies:   Adhesive [tape]   Social History   Tobacco Use  . Smoking status: Former Smoker    Quit date: 12/18/1985    Years since quitting: 34.1  . Smokeless tobacco: Never Used  Vaping Use  . Vaping Use: Never used  Substance Use Topics  . Alcohol use: Yes    Alcohol/week: 3.0 standard drinks    Types: 2 Glasses of wine, 1 Shots of liquor per week    Comment: 2 glasses /week  . Drug use: Never     ROS   Labs/Other Tests and Data Reviewed:    Recent Labs: 10/21/2019: ALT 17; BUN 17; Creatinine, Ser 1.10; Hemoglobin 13.9; Platelets 233; Potassium 5.0; Sodium 140   Recent Lipid Panel Lab Results  Component Value Date/Time   CHOL 178 10/21/2019 09:09 AM   TRIG 109 10/21/2019 09:09 AM   HDL 59 10/21/2019 09:09 AM   CHOLHDL 3.0 10/21/2019 09:09 AM   LDLCALC 100 (H) 10/21/2019 09:09 AM    Wt Readings from Last 3 Encounters:  01/22/20 209 lb (94.8 kg)  01/20/20 212 lb 9.6 oz (96.4 kg)  10/21/19 204 lb (92.5 kg)     Objective:    Vital Signs:  BP 124/72   Pulse 76   Temp (!) 96.8 F (36 C)   Ht 5\' 5"  (1.651 m)   Wt 209 lb (94.8 kg)   BMI 34.78 kg/m    Physical Exam   ASSESSMENT & PLAN:   1. Essential hypertension  2. Mixed  hyperlipidemia  3. Gastroesophageal reflux disease without esophagitis  4. Prediabetes    No orders of the defined types were placed in this encounter.    No orders of the defined types were placed in this encounter.   COVID-19 Education: The signs and symptoms of COVID-19 were discussed with the patient and how to seek care for testing (follow up with PCP or arrange E-visit). The importance of social distancing was discussed today.   I spent < time > minutes dedicated to the  care of this patient on the date of this encounter to include face-to-face time with the patient, as well as: ***  Follow Up:  {F/U Format:[phone]} {follow up:15908}  Signed,  Blane Ohara, MD  01/22/2020 9:17 AM    [facility] [place]

## 2020-01-23 LAB — CBC WITH DIFFERENTIAL/PLATELET
Basophils Absolute: 0.1 10*3/uL (ref 0.0–0.2)
Basos: 1 %
EOS (ABSOLUTE): 0.2 10*3/uL (ref 0.0–0.4)
Eos: 3 %
Hematocrit: 40.3 % (ref 34.0–46.6)
Hemoglobin: 13.8 g/dL (ref 11.1–15.9)
Immature Grans (Abs): 0 10*3/uL (ref 0.0–0.1)
Immature Granulocytes: 0 %
Lymphocytes Absolute: 2.1 10*3/uL (ref 0.7–3.1)
Lymphs: 29 %
MCH: 30.8 pg (ref 26.6–33.0)
MCHC: 34.2 g/dL (ref 31.5–35.7)
MCV: 90 fL (ref 79–97)
Monocytes Absolute: 0.8 10*3/uL (ref 0.1–0.9)
Monocytes: 10 %
Neutrophils Absolute: 4.1 10*3/uL (ref 1.4–7.0)
Neutrophils: 57 %
Platelets: 232 10*3/uL (ref 150–450)
RBC: 4.48 x10E6/uL (ref 3.77–5.28)
RDW: 13.2 % (ref 11.7–15.4)
WBC: 7.2 10*3/uL (ref 3.4–10.8)

## 2020-01-23 LAB — LIPID PANEL
Chol/HDL Ratio: 2.9 ratio (ref 0.0–4.4)
Cholesterol, Total: 183 mg/dL (ref 100–199)
HDL: 63 mg/dL (ref >39)
LDL Chol Calc (NIH): 98 mg/dL (ref 0–99)
Triglycerides: 126 mg/dL (ref 0–149)
VLDL Cholesterol Cal: 22 mg/dL (ref 5–40)

## 2020-01-23 LAB — COMPREHENSIVE METABOLIC PANEL
ALT: 19 IU/L (ref 0–32)
AST: 21 IU/L (ref 0–40)
Albumin/Globulin Ratio: 2.1 (ref 1.2–2.2)
Albumin: 4.4 g/dL (ref 3.7–4.7)
Alkaline Phosphatase: 75 IU/L (ref 44–121)
BUN/Creatinine Ratio: 14 (ref 12–28)
BUN: 14 mg/dL (ref 8–27)
Bilirubin Total: 0.6 mg/dL (ref 0.0–1.2)
CO2: 23 mmol/L (ref 20–29)
Calcium: 9.7 mg/dL (ref 8.7–10.3)
Chloride: 103 mmol/L (ref 96–106)
Creatinine, Ser: 1.03 mg/dL — ABNORMAL HIGH (ref 0.57–1.00)
GFR calc Af Amer: 62 mL/min/{1.73_m2} (ref >59)
GFR calc non Af Amer: 54 mL/min/{1.73_m2} — ABNORMAL LOW (ref >59)
Globulin, Total: 2.1 g/dL (ref 1.5–4.5)
Glucose: 111 mg/dL — ABNORMAL HIGH (ref 65–99)
Potassium: 5.2 mmol/L (ref 3.5–5.2)
Sodium: 141 mmol/L (ref 134–144)
Total Protein: 6.5 g/dL (ref 6.0–8.5)

## 2020-01-23 LAB — CARDIOVASCULAR RISK ASSESSMENT

## 2020-01-23 LAB — HEMOGLOBIN A1C
Est. average glucose Bld gHb Est-mCnc: 128 mg/dL
Hgb A1c MFr Bld: 6.1 % — ABNORMAL HIGH (ref 4.8–5.6)

## 2020-02-05 DIAGNOSIS — Z1152 Encounter for screening for COVID-19: Secondary | ICD-10-CM | POA: Diagnosis not present

## 2020-02-09 ENCOUNTER — Other Ambulatory Visit: Payer: Self-pay | Admitting: Family Medicine

## 2020-02-09 DIAGNOSIS — E782 Mixed hyperlipidemia: Secondary | ICD-10-CM

## 2020-02-10 DIAGNOSIS — Z03818 Encounter for observation for suspected exposure to other biological agents ruled out: Secondary | ICD-10-CM | POA: Diagnosis not present

## 2020-02-23 ENCOUNTER — Other Ambulatory Visit: Payer: Self-pay | Admitting: Family Medicine

## 2020-02-23 DIAGNOSIS — F330 Major depressive disorder, recurrent, mild: Secondary | ICD-10-CM

## 2020-03-17 ENCOUNTER — Telehealth: Payer: Self-pay

## 2020-03-17 DIAGNOSIS — G4733 Obstructive sleep apnea (adult) (pediatric): Secondary | ICD-10-CM | POA: Diagnosis not present

## 2020-03-17 NOTE — Telephone Encounter (Signed)
LM for patient, she is due for colon cancer screening - Cologuard/Colonoscopy

## 2020-04-08 ENCOUNTER — Telehealth: Payer: Self-pay

## 2020-04-08 NOTE — Progress Notes (Signed)
    Chronic Care Management Pharmacy Assistant   Name: Connie Marks  MRN: [id number] DOB: Oct 15, 1945  Reason for Encounter: General adherence call Patient Questions:  1.  Have you seen any other providers since your last visit? No  2.  Any changes in your medicines or health? 01/21/21-PCP start Famotidine, stop Ibuprofen     PCP : Blane Ohara, MD  Allergies:   Allergies  Allergen Reactions  . Adhesive [Tape]     Red whelps     Medications: Outpatient Encounter Medications as of 04/08/2020  Medication Sig  . aspirin EC 81 MG tablet Take 81 mg by mouth daily. Swallow whole.  Marland Kitchen buPROPion (WELLBUTRIN SR) 150 MG 12 hr tablet Take 1 tablet (150 mg total) by mouth 2 (two) times daily. tablet  . Calcium Carbonate+Vitamin D 600-200 MG-UNIT TABS Take 1 tablet by mouth daily.  . citalopram (CELEXA) 40 MG tablet Take 1 tablet (40 mg total) by mouth in the morning. tablet  . famotidine (PEPCID) 40 MG tablet Take 1 tablet (40 mg total) by mouth at bedtime.  . lansoprazole (PREVACID) 30 MG capsule Take 30 mg by mouth daily at 12 noon.  . Multiple Vitamins-Minerals (MULTIVITAL PO) Take by mouth.  Bertram Gala Glycol-Propyl Glycol (SYSTANE) 0.4-0.3 % SOLN Apply 1 drop to eye daily as needed.  . simvastatin (ZOCOR) 20 MG tablet Take 1 tablet (20 mg total) by mouth daily.  . valACYclovir (VALTREX) 1000 MG tablet Take 2 tablets (2,000 mg total) by mouth 2 (two) times daily. 2 tablet bid at onset   No facility-administered encounter medications on file as of 04/08/2020.    Current Diagnosis: Patient Active Problem List   Diagnosis Date Noted  . Medicare annual wellness visit, subsequent 01/20/2020  . Prediabetes 07/17/2019  . Toe pain, right 07/17/2019  . Class 1 obesity due to excess calories with serious comorbidity and body mass index (BMI) of 34.0 to 34.9 in adult 07/17/2019  . RLS (restless legs syndrome) 07/17/2019  . Hyperlipidemia   . Depression   . GERD (gastroesophageal reflux  disease)   . Contusion of left great toe without damage to nail 04/03/2019   Spoke to patient stated she is doing well overall, she is having some insomnia, she stated she is sleeping in 2-3 hour increments.  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 Stated she has felt tired lately.  Patient stated she has done her housework, but has not gotten any additional exercise.   Patient stated her diet is ok, but could be better.  Patient stated she has been using her CPAP machine.  She wanted me to let Lucia Gaskins, CPP know that she wanted to do the Cologuard to screen for colon cancer.  I told her I would let her know.   Patient is taking her medication as directed and has no side effects.   Follow-Up:  Pharmacist Review  Lucia Gaskins, CPP  Leilani Able, [facility] Clinical Pharmacist Assistant [phone]

## 2020-04-11 IMAGING — CT CT ORBITS W/O CM
1 series · 15 of 30 positions shown, 19 images · non-contrast
Comparison: 12/05/2018

CLINICAL DATA: Right orbital floor fracture post reconstruction,
diplopia

EXAM:
CT ORBITS WITHOUT CONTRAST
TECHNIQUE: Multidetector CT images were obtained using the standard protocol
without intravenous contrast.

[Series 4: orbits-soft · axial · 0.30mm/px · z∈[-104,+0]mm · 15 of 56 slices shown, 19 images]
[im 2/56  brain]
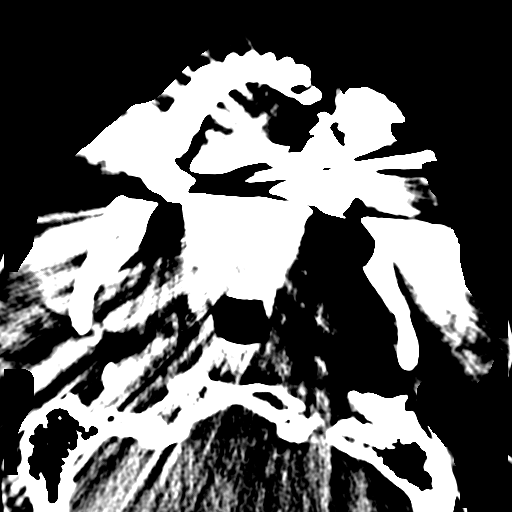
[im 2/56  bone]
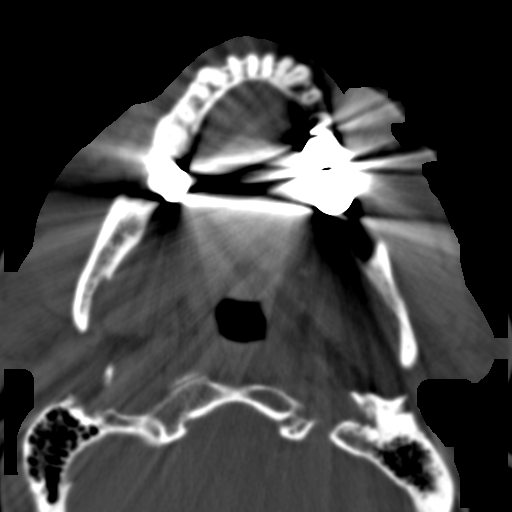
[im 6/56  bone]
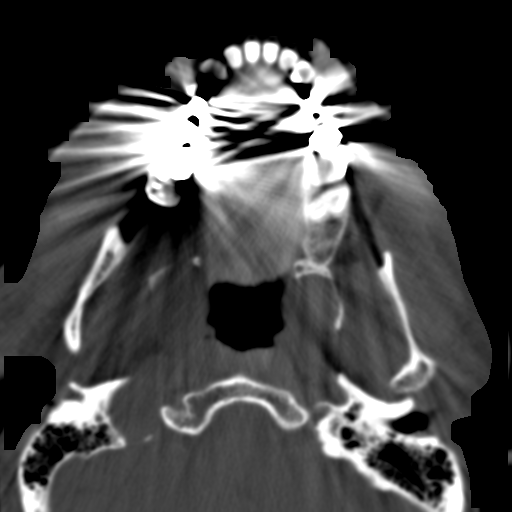
[im 10/56  bone]
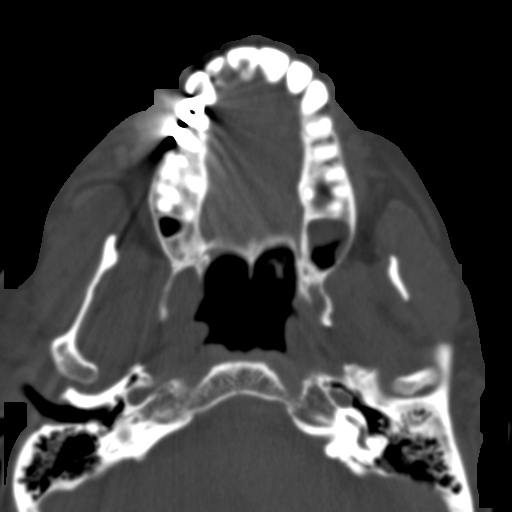
[im 14/56  bone]
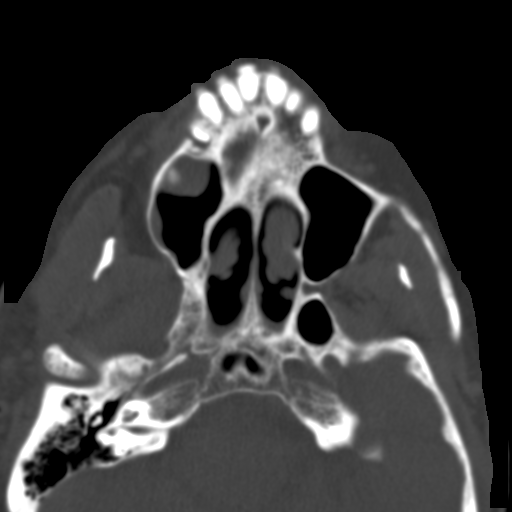
[im 18/56  brain]
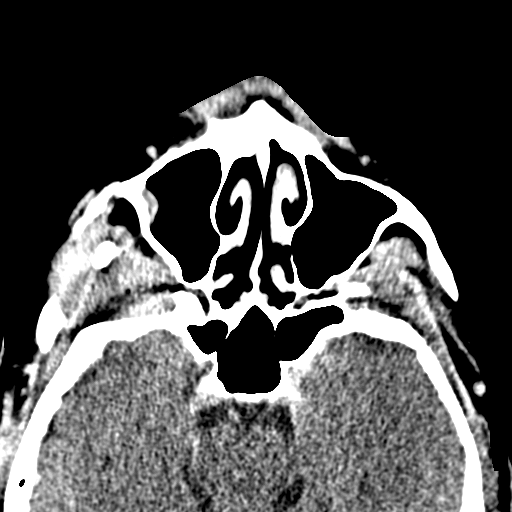
[im 18/56  bone]
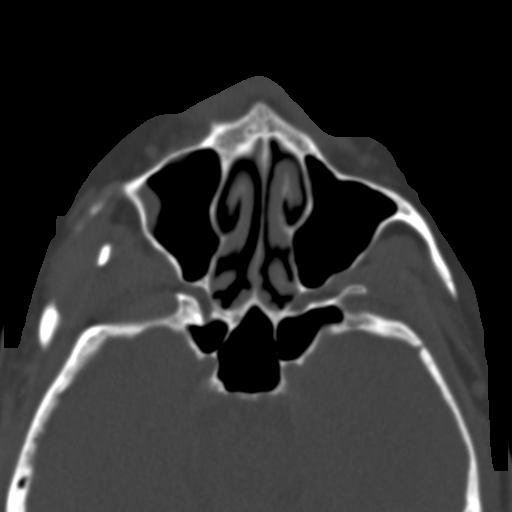
[im 21/56  bone]
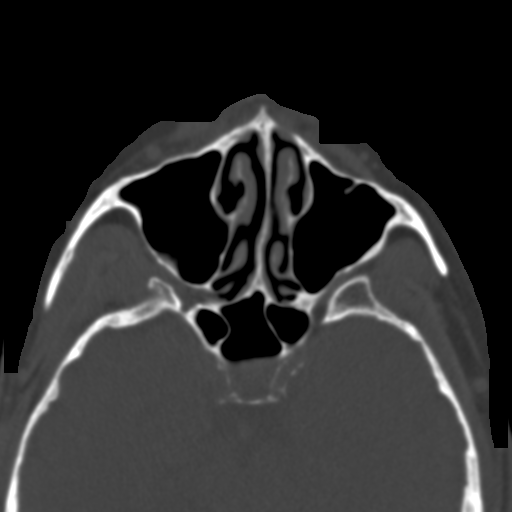
[im 25/56  bone]
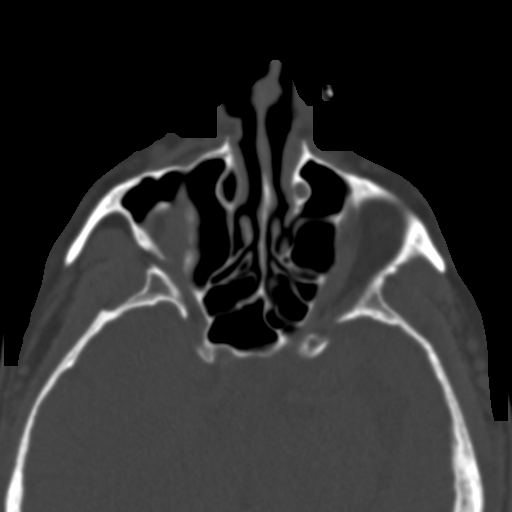
[im 29/56  bone]
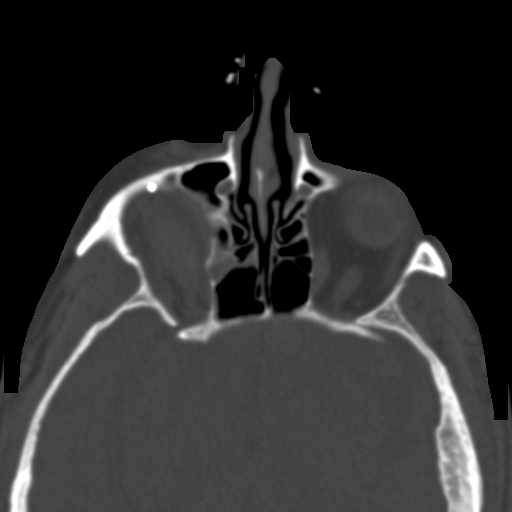
[im 31/56  brain]
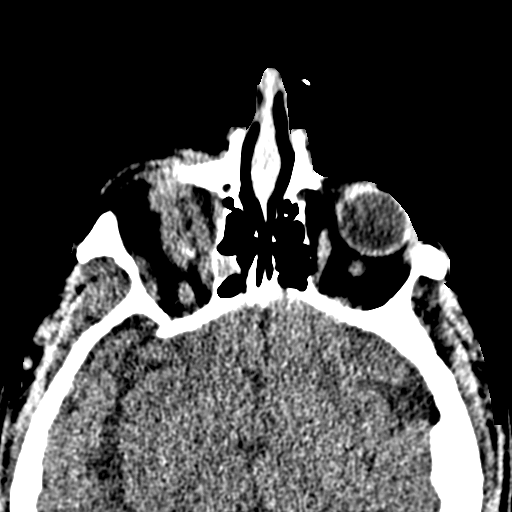
[im 31/56  bone]
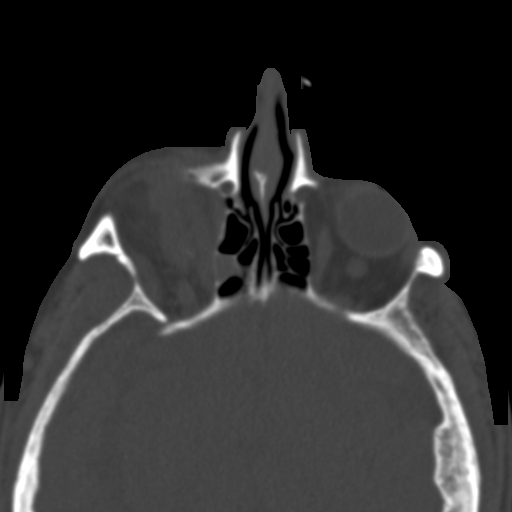
[im 35/56  bone]
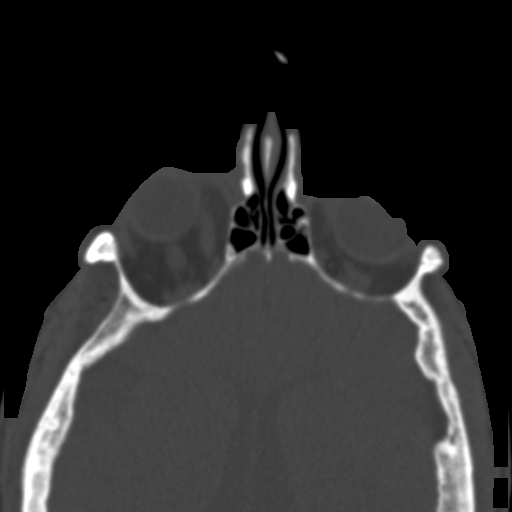
[im 38/56  bone]
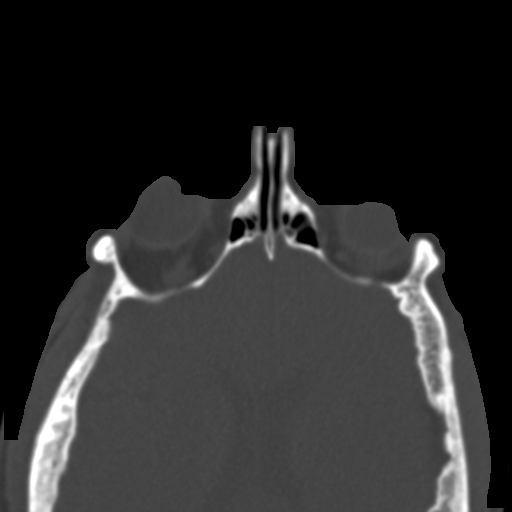
[im 42/56  bone]
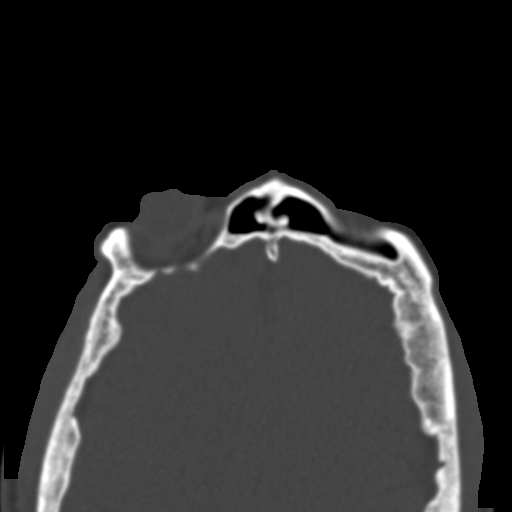
[im 46/56  brain]
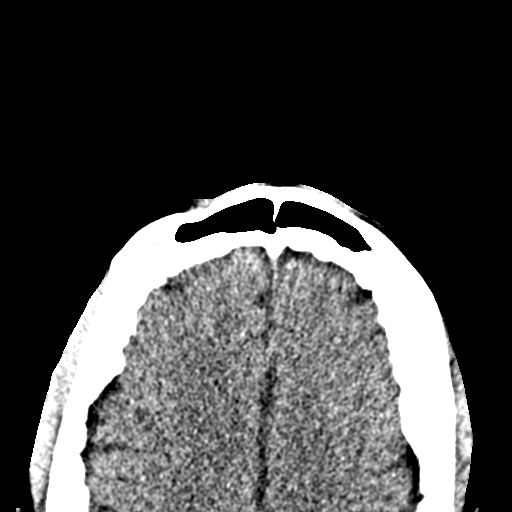
[im 46/56  bone]
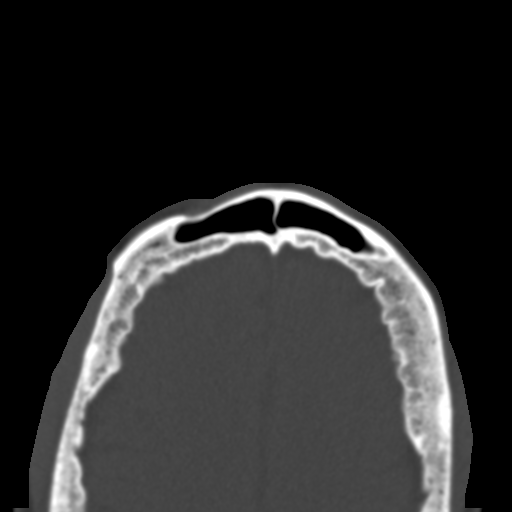
[im 50/56  bone]
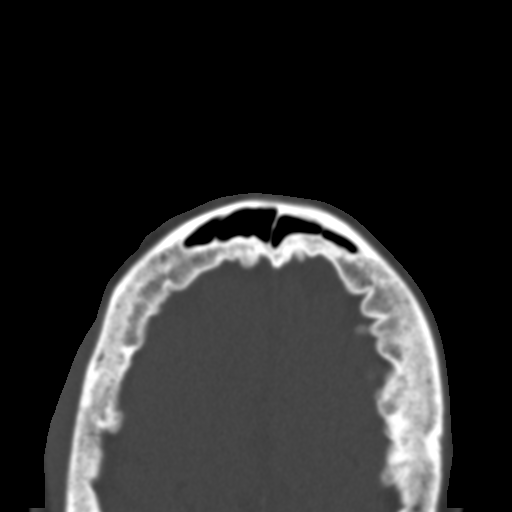
[im 54/56  bone]
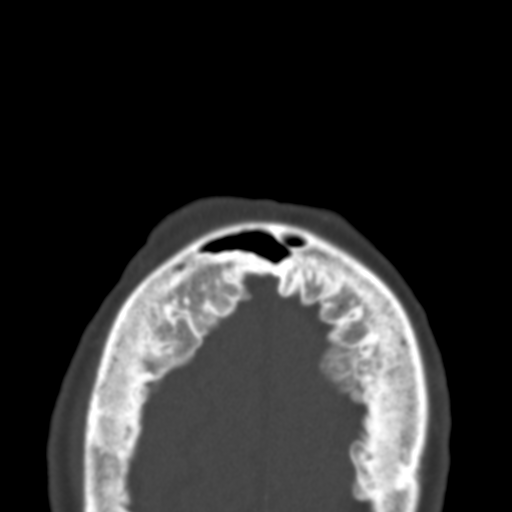

[15 of 30 positions shown; findings below may reference images not displayed]

FINDINGS: Orbits: Comminuted fracture of the right orbital floor is again
identified with depression into the maxillary sinus. There has been
orbital floor reconstruction with a non metallic implant and a
single screw identified anteriorly. The barrier implant does not
extend into the fracture defect and the inferior rectus runs above
this. Soft tissue density is present between the implant and
depressed fracture fragments, which may reflect postoperative change
or residual hematoma.

Suspected osseous fragment within the inferior orbit adjacent to the
inferior rectus (series 6, image 29).

There is persistent mild right proptosis. Left orbit is
unremarkable.

Limited intracranial imaging demonstrates no acute abnormality.

Visualized sinuses: Lobular maxillary sinus mucosal thickening.
Persistent opacification of a posterior right ethmoid air cell.

Soft tissues: Resolution of right periorbital and facial soft tissue
swelling

Limited intracranial: No acute abnormality.
IMPRESSION: Interval right orbital floor reconstruction with reduction of
herniated orbital contents. There is nonspecific soft tissue density
between the implant and depressed fracture fragments, which may
reflect postoperative change or residual hematoma.

Possible small osseous fragment within the inferior orbit adjacent
to the inferior rectus.

Mild right proptosis.

## 2020-04-14 ENCOUNTER — Other Ambulatory Visit: Payer: Self-pay

## 2020-04-14 DIAGNOSIS — Z1211 Encounter for screening for malignant neoplasm of colon: Secondary | ICD-10-CM

## 2020-04-14 NOTE — Progress Notes (Signed)
Patient is due for Cologuard,  Order entered into [facility] portal, patient aware.

## 2020-04-28 NOTE — Progress Notes (Signed)
Subjective:  Patient ID: Connie Marks, female    DOB: 10-06-1945  Age: 75 y.o. MRN: [id number]  Chief Complaint  Patient presents with  . Hyperlipidemia    HPI Mixed hyperlipidemia Patient is taking Simvastatin 20 mg daily.  Gastroesophageal reflux disease without esophagitis Stopped pepcid because not working well. She is currently taking prevacid.  Prediabetes Patient is eating healthy and low carbs diet.  Depression Patient takes Wellbutrin and celexa.   Insomnia: goes to sleep okay, but gets up every 2-3 hours. Sometimes can go right back to sleep, but even then she feels tired. Melatonin gummies not helping her stay asleep .  Current Outpatient Medications on File Prior to Visit  Medication Sig Dispense Refill  . aspirin EC 81 MG tablet Take 81 mg by mouth daily. Swallow whole.    Marland Kitchen buPROPion (WELLBUTRIN SR) 150 MG 12 hr tablet Take 1 tablet (150 mg total) by mouth 2 (two) times daily. tablet 180 tablet 0  . Calcium Carbonate+Vitamin D 600-200 MG-UNIT TABS Take 1 tablet by mouth daily.    . citalopram (CELEXA) 40 MG tablet Take 1 tablet (40 mg total) by mouth in the morning. tablet 90 tablet 0  . lansoprazole (PREVACID) 30 MG capsule Take 30 mg by mouth daily at 12 noon.    . Multiple Vitamins-Minerals (MULTIVITAL PO) Take by mouth.    Bertram Gala Glycol-Propyl Glycol (SYSTANE) 0.4-0.3 % SOLN Apply 1 drop to eye daily as needed.    . simvastatin (ZOCOR) 20 MG tablet Take 1 tablet (20 mg total) by mouth daily. 90 tablet 0  . valACYclovir (VALTREX) 1000 MG tablet Take 2 tablets (2,000 mg total) by mouth 2 (two) times daily. 2 tablet bid at onset 24 tablet 0   No current facility-administered medications on file prior to visit.   Past Medical History:  Diagnosis Date  . Anxiety   . Depression   . GERD (gastroesophageal reflux disease)   . Hyperlipidemia   . Prediabetes   . Sleep apnea    uses CPAP every night   Past Surgical History:  Procedure Laterality Date   . CESAREAN SECTION    . DIAGNOSTIC LAPAROSCOPY    . HERNIA REPAIR    . JOINT REPLACEMENT     knee replacements bilat  . JOINT REPLACEMENT     hip replacements bilat  . ORIF ORBITAL FRACTURE Right 12/19/2018   Procedure: OPEN TREATMENT RIGHT ORBITAL FLOOR WITH IMPLANT, PERIORBITAL APPROACH;  Surgeon: Glenna Fellows, MD;  Location: [facility];  Service: Plastics;  Laterality: Right;  . ORIF ORBITAL FRACTURE Right 02/03/2019   Procedure: OPEN TREATMENT RIGHT ORBITAL FLOOR FRACTURE REVISION, PERI ORBITAL APPROACH;  Surgeon: Glenna Fellows, MD;  Location: [facility];  Service: Plastics;  Laterality: Right;    History reviewed. No pertinent family history. Social History   Socioeconomic History  . Marital status: Divorced    Spouse name: Not on file  . Number of children: Not on file  . Years of education: Not on file  . Highest education level: Not on file  Occupational History  . Not on file  Tobacco Use  . Smoking status: Former Smoker    Quit date: 12/18/1985    Years since quitting: 34.3  . Smokeless tobacco: Never Used  Vaping Use  . Vaping Use: Never used  Substance and Sexual Activity  . Alcohol use: Yes    Alcohol/week: 3.0 standard drinks    Types: 2 Glasses of wine, 1 Shots  of liquor per week    Comment: 2 glasses /week  . Drug use: Never  . Sexual activity: Not on file  Other Topics Concern  . Not on file  Social History Narrative  . Not on file   Social Determinants of Health   Financial Resource Strain: Not on file  Food Insecurity: No Food Insecurity  . Worried About Programme researcher, broadcasting/film/video in the Last Year: Never true  . Ran Out of Food in the Last Year: Never true  Transportation Needs: No Transportation Needs  . Lack of Transportation (Medical): No  . Lack of Transportation (Non-Medical): No  Physical Activity: Not on file  Stress: Not on file  Social Connections: Not on file    Review of Systems  Constitutional:  Positive for fatigue. Negative for chills and fever.  HENT: Negative for congestion, ear pain, rhinorrhea and sore throat.   Respiratory: Negative for cough and shortness of breath.   Cardiovascular: Positive for chest pain.  Gastrointestinal: Negative for abdominal pain, constipation, diarrhea, nausea and vomiting.  Genitourinary: Negative for dysuria and urgency.  Musculoskeletal: Positive for arthralgias. Negative for back pain and myalgias.  Neurological: Negative for dizziness, weakness, light-headedness and headaches.  Psychiatric/Behavioral: Negative for dysphoric mood. The patient is not nervous/anxious.      Objective:  BP 134/84   Pulse 77   Temp (!) 94 F (34.4 C)   Ht 5\' 5"  (1.651 m)   Wt 215 lb (97.5 kg)   SpO2 100%   BMI 35.78 kg/m   BP/Weight 04/29/2020 01/22/2020 01/20/2020  Systolic BP 134 124 138  Diastolic BP 84 72 74  Wt. (Lbs) 215 209 212.6  BMI 35.78 34.78 35.93    Physical Exam Vitals reviewed.  Constitutional:      Appearance: Normal appearance. She is normal weight.  Neck:     Vascular: No carotid bruit.  Cardiovascular:     Rate and Rhythm: Normal rate and regular rhythm.     Pulses: Normal pulses.     Heart sounds: Normal heart sounds.  Pulmonary:     Effort: Pulmonary effort is normal. No respiratory distress.     Breath sounds: Normal breath sounds.  Abdominal:     General: Abdomen is flat. Bowel sounds are normal.     Palpations: Abdomen is soft.     Tenderness: There is no abdominal tenderness.  Neurological:     Mental Status: She is alert and oriented to person, place, and time.  Psychiatric:        Mood and Affect: Mood normal.        Behavior: Behavior normal.     Diabetic Foot Exam - Simple   Simple Foot Form Diabetic Foot exam was performed with the following findings: Yes 04/29/2020  8:43 AM  Visual Inspection See comments: Yes Sensation Testing Intact to touch and monofilament testing bilaterally: Yes Pulse  Check Posterior Tibialis and Dorsalis pulse intact bilaterally: Yes Comments Hammer toes, erythema over toes.  Pes planus       Lab Results  Component Value Date   WBC 7.2 01/22/2020   HGB 13.8 01/22/2020   HCT 40.3 01/22/2020   PLT 232 01/22/2020   GLUCOSE 111 (H) 01/22/2020   CHOL 183 01/22/2020   TRIG 126 01/22/2020   HDL 63 01/22/2020   LDLCALC 98 01/22/2020   ALT 19 01/22/2020   AST 21 01/22/2020   NA 141 01/22/2020   K 5.2 01/22/2020   CL 103 01/22/2020   CREATININE 1.03 (  H) 01/22/2020   BUN 14 01/22/2020   CO2 23 01/22/2020   HGBA1C 6.1 (H) 01/22/2020      Assessment & Plan:   1. Mixed hyperlipidemia Well controlled.  No changes to medicines.  Continue to work on eating a healthy diet and exercise.  Labs drawn today.  - CBC with Differential/Platelet - Comprehensive metabolic panel - Lipid panel  2. Gastroesophageal reflux disease without esophagitis Add pepcid to lansoprazole to help with GERD.  If chest pain recurs and is not relieved by burping, patient should call for visit.   3. Prediabetes Well controlled.  No medication changes recommended. Continue healthy diet and exercise.  Labs drawn today: - CBC with Differential/Platelet - Hemoglobin A1c  4. Mild episode of recurrent major depressive disorder (HCC) - No changes in medicine.  5. Other insomnia -  Start on trazodone 50 mg once at night.  6. Severe obesity with body mass index (BMI) of 35.0 to 35.9 and comorbidity (HCC) Recommend continue to work on eating healthy diet and exercise.  7. Other chest pain  Add pepcid to lansoprazole to help with GERD.  If chest pain recurs and is not relieved by burping, patient should call for visit.   I,Marla I Leal-Borjas,acting as a scribe for Blane Ohara, MD.,have documented all relevant documentation on the behalf of Blane Ohara, MD,as directed by  Blane Ohara, MD while in the presence of Blane Ohara, MD.  Meds ordered this encounter   Medications  . traZODone (DESYREL) 50 MG tablet    Sig: Take 0.5-1 tablets (25-50 mg total) by mouth at bedtime as needed for sleep.    Dispense:  30 tablet    Refill:  3    Orders Placed This Encounter  Procedures  . CBC with Differential/Platelet  . Comprehensive metabolic panel  . Lipid panel  . Hemoglobin A1c    Follow-up: No follow-ups on file.  An After Visit Summary was printed and given to the patient.  Blane Ohara, MD [facility] [phone]

## 2020-04-29 ENCOUNTER — Other Ambulatory Visit: Payer: Self-pay

## 2020-04-29 ENCOUNTER — Encounter: Payer: Self-pay | Admitting: Family Medicine

## 2020-04-29 ENCOUNTER — Ambulatory Visit (INDEPENDENT_AMBULATORY_CARE_PROVIDER_SITE_OTHER): Payer: Medicare Other | Admitting: Family Medicine

## 2020-04-29 VITALS — BP 134/84 | HR 77 | Temp 94.0°F | Ht 65.0 in | Wt 215.0 lb

## 2020-04-29 DIAGNOSIS — F330 Major depressive disorder, recurrent, mild: Secondary | ICD-10-CM | POA: Diagnosis not present

## 2020-04-29 DIAGNOSIS — I10 Essential (primary) hypertension: Secondary | ICD-10-CM | POA: Diagnosis not present

## 2020-04-29 DIAGNOSIS — R7303 Prediabetes: Secondary | ICD-10-CM | POA: Diagnosis not present

## 2020-04-29 DIAGNOSIS — G4709 Other insomnia: Secondary | ICD-10-CM | POA: Diagnosis not present

## 2020-04-29 DIAGNOSIS — R0789 Other chest pain: Secondary | ICD-10-CM

## 2020-04-29 DIAGNOSIS — Z6835 Body mass index (BMI) 35.0-35.9, adult: Secondary | ICD-10-CM

## 2020-04-29 DIAGNOSIS — K219 Gastro-esophageal reflux disease without esophagitis: Secondary | ICD-10-CM

## 2020-04-29 DIAGNOSIS — E782 Mixed hyperlipidemia: Secondary | ICD-10-CM | POA: Diagnosis not present

## 2020-04-29 MED ORDER — TRAZODONE HCL 50 MG PO TABS: 25.0000 mg | ORAL_TABLET | Freq: Every evening | ORAL | 3 refills | Status: DC | PRN

## 2020-04-29 NOTE — Patient Instructions (Addendum)
Add pepcid to lansoprazole to help with GERD.  If chest pain recurs and is not relieved by burping, patient should call for visit.   Insomnia: start on trazodone 50 mg once at night.

## 2020-04-30 LAB — COMPREHENSIVE METABOLIC PANEL
ALT: 19 IU/L (ref 0–32)
AST: 17 IU/L (ref 0–40)
Albumin/Globulin Ratio: 1.8 (ref 1.2–2.2)
Albumin: 4.4 g/dL (ref 3.7–4.7)
Alkaline Phosphatase: 84 IU/L (ref 44–121)
BUN/Creatinine Ratio: 17 (ref 12–28)
BUN: 18 mg/dL (ref 8–27)
Bilirubin Total: 0.5 mg/dL (ref 0.0–1.2)
CO2: 20 mmol/L (ref 20–29)
Calcium: 9.1 mg/dL (ref 8.7–10.3)
Chloride: 103 mmol/L (ref 96–106)
Creatinine, Ser: 1.04 mg/dL — ABNORMAL HIGH (ref 0.57–1.00)
Globulin, Total: 2.4 g/dL (ref 1.5–4.5)
Glucose: 114 mg/dL — ABNORMAL HIGH (ref 65–99)
Potassium: 5 mmol/L (ref 3.5–5.2)
Sodium: 142 mmol/L (ref 134–144)
Total Protein: 6.8 g/dL (ref 6.0–8.5)
eGFR: 56 mL/min/{1.73_m2} — ABNORMAL LOW (ref >59)

## 2020-04-30 LAB — CBC WITH DIFFERENTIAL/PLATELET
Basophils Absolute: 0.1 10*3/uL (ref 0.0–0.2)
Basos: 1 %
EOS (ABSOLUTE): 0.2 10*3/uL (ref 0.0–0.4)
Eos: 2 %
Hematocrit: 40.2 % (ref 34.0–46.6)
Hemoglobin: 13.1 g/dL (ref 11.1–15.9)
Immature Grans (Abs): 0 10*3/uL (ref 0.0–0.1)
Immature Granulocytes: 0 %
Lymphocytes Absolute: 1.7 10*3/uL (ref 0.7–3.1)
Lymphs: 18 %
MCH: 29 pg (ref 26.6–33.0)
MCHC: 32.6 g/dL (ref 31.5–35.7)
MCV: 89 fL (ref 79–97)
Monocytes Absolute: 1 10*3/uL — ABNORMAL HIGH (ref 0.1–0.9)
Monocytes: 11 %
Neutrophils Absolute: 6.2 10*3/uL (ref 1.4–7.0)
Neutrophils: 68 %
Platelets: 258 10*3/uL (ref 150–450)
RBC: 4.51 x10E6/uL (ref 3.77–5.28)
RDW: 12.9 % (ref 11.7–15.4)
WBC: 9.1 10*3/uL (ref 3.4–10.8)

## 2020-04-30 LAB — CARDIOVASCULAR RISK ASSESSMENT

## 2020-04-30 LAB — LIPID PANEL
Chol/HDL Ratio: 3.3 ratio (ref 0.0–4.4)
Cholesterol, Total: 182 mg/dL (ref 100–199)
HDL: 56 mg/dL (ref >39)
LDL Chol Calc (NIH): 101 mg/dL — ABNORMAL HIGH (ref 0–99)
Triglycerides: 141 mg/dL (ref 0–149)
VLDL Cholesterol Cal: 25 mg/dL (ref 5–40)

## 2020-04-30 LAB — HEMOGLOBIN A1C
Est. average glucose Bld gHb Est-mCnc: 140 mg/dL
Hgb A1c MFr Bld: 6.5 % — ABNORMAL HIGH (ref 4.8–5.6)

## 2020-05-03 ENCOUNTER — Other Ambulatory Visit: Payer: Self-pay

## 2020-05-03 DIAGNOSIS — E782 Mixed hyperlipidemia: Secondary | ICD-10-CM

## 2020-05-03 MED ORDER — SIMVASTATIN 40 MG PO TABS: 40.0000 mg | ORAL_TABLET | Freq: Every day | ORAL | 1 refills | Status: DC

## 2020-05-05 DIAGNOSIS — G4733 Obstructive sleep apnea (adult) (pediatric): Secondary | ICD-10-CM | POA: Diagnosis not present

## 2020-05-06 ENCOUNTER — Other Ambulatory Visit: Payer: Self-pay | Admitting: Family Medicine

## 2020-05-06 DIAGNOSIS — G2581 Restless legs syndrome: Secondary | ICD-10-CM

## 2020-05-11 ENCOUNTER — Other Ambulatory Visit: Payer: Self-pay

## 2020-05-11 ENCOUNTER — Telehealth: Payer: Self-pay

## 2020-05-11 ENCOUNTER — Other Ambulatory Visit: Payer: Self-pay | Admitting: Legal Medicine

## 2020-05-11 MED ORDER — MIRTAZAPINE 15 MG PO TABS: 15.0000 mg | ORAL_TABLET | Freq: Every day | ORAL | 2 refills | Status: DC

## 2020-05-11 MED ORDER — ROPINIROLE HCL 3 MG PO TABS: 3.0000 mg | ORAL_TABLET | Freq: Every day | ORAL | 2 refills | Status: DC

## 2020-05-11 MED ORDER — PRAMIPEXOLE DIHYDROCHLORIDE ER 3 MG PO TB24: 3.0000 | ORAL_TABLET | Freq: Every evening | ORAL | 3 refills | Status: DC

## 2020-05-11 MED ORDER — PRAMIPEXOLE DIHYDROCHLORIDE 0.5 MG PO TABS: 0.5000 mg | ORAL_TABLET | Freq: Three times a day (TID) | ORAL | 3 refills | Status: DC

## 2020-05-11 NOTE — Telephone Encounter (Signed)
Pt calling requesting mirapex refill that was refused. Pt states she has been taking medication for years.   Les Pou and I looked into refills. Medication is not on med list. In December pt was switched from three times daily to once daily. Do not know how it was taken off of medication list.   Confirmed w/ pt. Pt is taking mirapex .5 mg once daily at night. Requesting this be sent to [facility] in [place].   Lorita Officer, CCMA 05/11/20 1:16 PM

## 2020-05-11 NOTE — Progress Notes (Signed)
Dose of mirapex was not on chart.  Insurance wants ropirimol, I sent mirapex 0.5mg  in lp

## 2020-05-11 NOTE — Telephone Encounter (Signed)
Mirapex called in, the drug company wants to change it to requip as cheaper. lp

## 2020-05-11 NOTE — Telephone Encounter (Signed)
Pt calling back bc remeron was prescribed. Pt was asking for mirapex for RLS. Please advise.   Terrill Mohr 05/11/20 2:49 PM

## 2020-05-11 NOTE — Progress Notes (Unsigned)
Pended medication pt is requesting.   Connie Marks 05/11/20 3:18 PM

## 2020-05-12 ENCOUNTER — Other Ambulatory Visit: Payer: Self-pay

## 2020-05-16 LAB — COLOGUARD

## 2020-05-24 ENCOUNTER — Other Ambulatory Visit: Payer: Self-pay | Admitting: Family Medicine

## 2020-05-24 DIAGNOSIS — F330 Major depressive disorder, recurrent, mild: Secondary | ICD-10-CM

## 2020-06-21 ENCOUNTER — Ambulatory Visit: Payer: Medicare Other | Attending: Internal Medicine

## 2020-06-21 ENCOUNTER — Other Ambulatory Visit: Payer: Self-pay

## 2020-06-21 ENCOUNTER — Other Ambulatory Visit (HOSPITAL_BASED_OUTPATIENT_CLINIC_OR_DEPARTMENT_OTHER): Payer: Self-pay

## 2020-06-21 DIAGNOSIS — Z23 Encounter for immunization: Secondary | ICD-10-CM

## 2020-06-21 MED ORDER — COVID-19 MRNA VACC (MODERNA) 100 MCG/0.5ML IM SUSP
INTRAMUSCULAR | 0 refills | Status: DC
Start: 2020-06-21 — End: 2020-07-11
  Filled 2020-06-21: qty 0.3, 1d supply, fill #0

## 2020-06-21 NOTE — Progress Notes (Signed)
   Covid-19 Vaccination Clinic  Name:  Connie Marks    MRN: [id number] DOB: November 13, 1945  06/21/2020  [name] was observed post Covid-19 immunization for 15 minutes without incident. She was provided with Vaccine Information Sheet and instruction to access the V-Safe system.   [name] was instructed to call 911 with any severe reactions post vaccine: Marland Kitchen Difficulty breathing  . Swelling of face and throat  . A fast heartbeat  . A bad rash all over body  . Dizziness and weakness   Immunizations Administered    Name Date Dose VIS Date Route   Moderna Covid-19 Booster Vaccine 06/21/2020 12:44 PM 0.25 mL 11/25/2019 Intramuscular   Manufacturer: Moderna   Lot: [id number]   NDC: 35009-381-82

## 2020-07-02 ENCOUNTER — Other Ambulatory Visit: Payer: Self-pay | Admitting: Family Medicine

## 2020-07-02 DIAGNOSIS — K219 Gastro-esophageal reflux disease without esophagitis: Secondary | ICD-10-CM

## 2020-07-04 DIAGNOSIS — Z1211 Encounter for screening for malignant neoplasm of colon: Secondary | ICD-10-CM | POA: Diagnosis not present

## 2020-07-04 LAB — COLOGUARD: Cologuard: NEGATIVE

## 2020-07-07 NOTE — Progress Notes (Signed)
Chronic Care Management Pharmacy Note  07/11/2020 Name:  Connie Marks MRN:  [id number] DOB:  07-11-1945  Summary:  Patient reports that she has improved dietary lifestyle choices since last appointment. She has experienced weight loss and is scheduled for updated labs at the end of the month. Patient had decided to hold off on metformin at last lab results.   Patient reports that she has tried and failed many things for depression in the past and this regimen is working well for her and makes her feel normal.. Patient;s current regimen is working well for her. The recommended max dose is 20 mg/daily >29 years of age.  Recommendations/Changes made from today's visit:  Consider metformin if a1c is not improved with upcoming lab results. Consider increase in simvastatin dose if LDL above goal <70 since patient a1c now diabetes.   Subjective: Connie Marks is an 75 y.o. year old female who is a primary patient of Cox, Kirsten, MD.  The CCM team was consulted for assistance with disease management and care coordination needs.    Engaged with patient by telephone for follow up visit in response to provider referral for pharmacy case management and/or care coordination services.   Consent to Services:  The patient was given information about Chronic Care Management services, agreed to services, and gave verbal consent prior to initiation of services.  Please see initial visit note for detailed documentation.   Patient Care Team: Rochel Brome, MD as PCP - General (Family Medicine) Burnice Logan, [facility] as Pharmacist (Pharmacist)  Recent office visits:  04/29/2020 - add pepcid for lansoprazole to help with GERD. Call for visit if chest pain recurs and not resolved by burping. Start on Trazodone at night for insomnia.  01/22/2020 - healthy eating and yoga/balance exercise recommended. Change prevacid to pepcid 40 mg once at night - may take prevacid for breakthrough symptoms. Recommend  Shingrix vaccine - 2 dose series. 01/20/2020 - medicare AWV.  Recent consult visits:   Hospital visits: None in previous 6 months   Objective:  Lab Results  Component Value Date   CREATININE 1.04 (H) 04/29/2020   BUN 18 04/29/2020   GFRNONAA 54 (L) 01/22/2020   GFRAA 62 01/22/2020   NA 142 04/29/2020   K 5.0 04/29/2020   CALCIUM 9.1 04/29/2020   CO2 20 04/29/2020   GLUCOSE 114 (H) 04/29/2020    Lab Results  Component Value Date/Time   HGBA1C 6.5 (H) 04/29/2020 08:53 AM   HGBA1C 6.1 (H) 01/22/2020 09:37 AM    Last diabetic Eye exam: No results found for: HMDIABEYEEXA  Last diabetic Foot exam: No results found for: HMDIABFOOTEX   Lab Results  Component Value Date   CHOL 182 04/29/2020   HDL 56 04/29/2020   LDLCALC 101 (H) 04/29/2020   TRIG 141 04/29/2020   CHOLHDL 3.3 04/29/2020    Hepatic Function Latest Ref Rng & Units 04/29/2020 01/22/2020 10/21/2019  Total Protein 6.0 - 8.5 g/dL 6.8 6.5 6.8  Albumin 3.7 - 4.7 g/dL 4.4 4.4 4.5  AST 0 - 40 IU/L _0 ALT 0 - 32 IU/L _1 Alk Phosphatase 44 - 121 IU/L 84 75 80  Total Bilirubin 0.0 - 1.2 mg/dL 0.5 0.6 0.3    No results found for: TSH, FREET4  CBC Latest Ref Rng & Units 04/29/2020 01/22/2020 10/21/2019  WBC 3.4 - 10.8 x10E3/uL 9.1 7.2 8.1  Hemoglobin 11.1 - 15.9 g/dL 13.1 13.8 13.9  Hematocrit 34.0 - 46.6 %  40.2 40.3 43.4  Platelets 150 - 450 x10E3/uL 258 232 233    No results found for: VD25OH  Clinical ASCVD: No  The 10-year ASCVD risk score Mikey Bussing DC Jr., et al., 2013) is: 15.5%   Values used to calculate the score:     Age: 43 years     Sex: Female     Is Non-Hispanic African American: No     Diabetic: No     Tobacco smoker: No     Systolic Blood Pressure: 250 mmHg     Is BP treated: No     HDL Cholesterol: 56 mg/dL     Total Cholesterol: 182 mg/dL    Depression screen [facility] 2/9 04/29/2020 01/20/2020 01/12/2020  Decreased Interest 1 0 0  Down, Depressed, Hopeless 0 0 0  PHQ - 2 Score 1 0 0   Altered sleeping 1 - -  Tired, decreased energy 1 - -  Change in appetite 0 - -  Feeling bad or failure about yourself  0 - -  Trouble concentrating 0 - -  Moving slowly or fidgety/restless 0 - -  Suicidal thoughts 0 - -  PHQ-9 Score 3 - -  Difficult doing work/chores Somewhat difficult - -     Other: (CHADS2VASc if Afib, MMRC or CAT for COPD, ACT, DEXA)  Social History   Tobacco Use  Smoking Status Former Smoker  . Quit date: 12/18/1985  . Years since quitting: 34.5  Smokeless Tobacco Never Used   BP Readings from Last 3 Encounters:  04/29/20 134/84  01/22/20 124/72  01/20/20 138/74   Pulse Readings from Last 3 Encounters:  04/29/20 77  01/22/20 76  01/20/20 72   Wt Readings from Last 3 Encounters:  04/29/20 215 lb (97.5 kg)  01/22/20 209 lb (94.8 kg)  01/20/20 212 lb 9.6 oz (96.4 kg)   BMI Readings from Last 3 Encounters:  04/29/20 35.78 kg/m  01/22/20 34.78 kg/m  01/20/20 35.93 kg/m    Assessment/Interventions: Review of patient past medical history, allergies, medications, health status, including review of consultants reports, laboratory and other test data, was performed as part of comprehensive evaluation and provision of chronic care management services.   SDOH:  (Social Determinants of Health) assessments and interventions performed: Yes  SDOH Screenings   Alcohol Screen: Low Risk   . Last Alcohol Screening Score (AUDIT): 3  Depression (PHQ2-9): Low Risk   . PHQ-2 Score: 3  Financial Resource Strain: Not on file  Food Insecurity: No Food Insecurity  . Worried About Charity fundraiser in the Last Year: Never true  . Ran Out of Food in the Last Year: Never true  Housing: Low Risk   . Last Housing Risk Score: 0  Physical Activity: Not on file  Social Connections: Not on file  Stress: Not on file  Tobacco Use: Medium Risk  . Smoking Tobacco Use: Former Smoker  . Smokeless Tobacco Use: Never Used  Transportation Needs: No Transportation Needs   . Lack of Transportation (Medical): No  . Lack of Transportation (Non-Medical): No    CCM Care Plan  Allergies  Allergen Reactions  . Adhesive [Tape]     Red whelps     Medications Reviewed Today    Reviewed by Burnice Logan, [facility] (Pharmacist) on 07/11/20 at 1200  Med List Status: <None>  Medication Order Taking? Sig Documenting Provider Last Dose Status Informant  aspirin EC 81 MG tablet [id number] Yes Take 81 mg by mouth daily. Swallow whole. [provider] Taking Active   buPROPion (WELLBUTRIN SR) 150 MG 12 hr tablet [id number] Yes Take 1 tablet (150 mg total) by mouth 2 (two) times daily. Marge Duncans, PA-C Taking Active   Calcium Carbonate+Vitamin D 600-200 MG-UNIT TABS [id number] Yes Take 1 tablet by mouth daily. [provider] Taking Active   citalopram (CELEXA) 40 MG tablet [id number] Yes Take 1 tablet (40 mg total) by mouth in the morning. Marge Duncans, PA-C Taking Active   lansoprazole (PREVACID) 30 MG capsule [id number] Yes Take 30 mg by mouth daily at 12 noon. [provider] Taking Active   Multiple Vitamins-Minerals (MULTIVITAL PO) [id number] Yes Take by mouth. [provider] Taking Active   Polyethyl Glycol-Propyl Glycol (SYSTANE) 0.4-0.3 % SOLN [id number] Yes Apply 1 drop to eye daily as needed. [provider] Taking Active   pramipexole (MIRAPEX) 0.5 MG tablet [id number] Yes Take 1 tablet (0.5 mg total) by mouth 3 (three) times daily.  Patient taking differently: Take 0.5 mg by mouth at bedtime.   Lillard Anes, MD Taking Active   simvastatin (ZOCOR) 40 MG tablet [id number] Yes Take 1 tablet (40 mg total) by mouth daily. Cox, Kirsten, MD Taking Active   traZODone (DESYREL) 50 MG tablet [id number] Yes Take 0.5-1 tablets (25-50 mg total) by mouth at bedtime as needed for sleep. Cox, Kirsten, MD Taking Active   valACYclovir (VALTREX) 1000 MG tablet [id number] Yes Take 2 tablets (2,000 mg total) by mouth 2 (two) times daily. 2  tablet bid at onset Rochel Brome, MD Taking Active           Patient Active Problem List   Diagnosis Date Noted  . Medicare annual wellness visit, subsequent 01/20/2020  . Prediabetes 07/17/2019  . Toe pain, right 07/17/2019  . Class 1 obesity due to excess calories with serious comorbidity and body mass index (BMI) of 34.0 to 34.9 in adult 07/17/2019  . RLS (restless legs syndrome) 07/17/2019  . Hyperlipidemia   . Depression   . GERD (gastroesophageal reflux disease)   . Contusion of left great toe without damage to nail 04/03/2019    Immunization History  Administered Date(s) Administered  . Fluad Quad(high Dose 65+) 10/21/2019  . Influenza, High Dose Seasonal PF 10/27/2018  . Influenza-Unspecified 10/09/2018  . Moderna SARS-COV2 Booster Vaccination 12/08/2019, 06/21/2020  . Moderna Sars-Covid-2 Vaccination 02/25/2019, 03/23/2019  . Pneumococcal Conjugate-13 12/21/2013  . Pneumococcal Polysaccharide-23 08/29/2010  . Tdap 03/11/2018  . Zoster, Live 08/28/2012    Conditions to be addressed/monitored:  Hyperlipidemia, GERD, Depression and prediabetes  Care Plan : [place]  Updates made by Burnice Logan, [place] since 07/11/2020 12:00 AM    Problem: dm, hld, gerd, depression   Priority: High  Onset Date: 07/11/2020    Long-Range Goal: Disease State Management   Start Date: 07/11/2020  Expected End Date: 07/11/2021  This Visit's Progress: On track  Priority: High  Note:     Current Barriers:  . Unable to maintain control of cholesterol  Pharmacist Clinical Goal(s):  Marland Kitchen Patient will achieve control of cholesterol  as evidenced by lipid panel through collaboration with PharmD and provider.   Interventions: . 1:1 collaboration with Rochel Brome, MD regarding development and update of comprehensive plan of care as evidenced by provider attestation and co-signature . Inter-disciplinary care team collaboration (see longitudinal plan of care) . Comprehensive  medication review performed; medication list updated in electronic medical record  Hyperlipidemia: (LDL goal < 100) -Controlled -Current treatment: . simvastatin 40 mg daily  -  Medications previously tried: none reported  -Current dietary patterns: doing Noom currently and weight loss -Current exercise habits: walking more since new puppy and daughter are home -Educated on Cholesterol goals;  Benefits of statin for ASCVD risk reduction; Importance of limiting foods high in cholesterol; Exercise goal of 150 minutes per week; -Counseled on diet and exercise extensively Recommended to continue current medication Educated on benefits of healthy diet and exercise.  Prediabetes (A1c goal <6.5%) -Controlled -Current medications: . diet and lifestyle - Dr. Tobie Poet recommended Metformin 500 mg daily with last lab results -Medications previously tried: none reported  -Current home glucose readings: not checking -Denies hypoglycemic/hyperglycemic symptoms -Current meal patterns: Noom for weight loss support  . Patient reports weight loss and improved food choices since resuming Noom. Updated lab results scheduled for end of June.  -Current exercise: Some exercise - walking with new puppy..  -Educated on A1c and blood sugar goals; Exercise goal of 150 minutes per week; Benefits of weight loss; Carbohydrate counting and/or plate method -Counseled to check feet daily and get yearly eye exams -Counseled on diet and exercise extensively  Depression/Anxiety (Goal: manage depression) -Controlled -Current treatment: . Bupropion sr 150 mg 12 hour tablet bid  . Citalopram 40 mg daily am  -Medications previously tried/failed: patient states she has tried and failed many alternatives in the past and doing really well with current treatment. She doesn't want to make any changes.  -PHQ9: 3 -Educated on Benefits of medication for symptom control -Assessed symptoms management. Patient reports that she has  tried and failed many things in the past and this regimen is working well for her and makes her feel normal.. Patient;s current regimen is working well for her. The recommended max dose is 20 mg/daily >3 years of age.   Restless Leg Syndrome  (Goal: manage symptoms of restless leg syndrome) -Controlled -Current treatment  . Pramipexole 0.5 mg daily -Medications previously tried: none reported  -Collaborated with Dr. Alyse Low nurse to get 90 day supply of pramipexole sent to pharmacy.     Patient Goals/Self-Care Activities . Patient will:  - take medications as prescribed target a minimum of 150 minutes of moderate intensity exercise weekly engage in dietary modifications by continuing to make healthier choices with Noom.   Follow Up Plan: Telephone follow up appointment with care management team member scheduled for: 07/2021      Medication Assistance: None required.  Patient affirms current coverage meets needs.  Compliance/Adherence/Medication fill history: Care Gaps: Patient needs AWV for 2022.   Star-Rating Drugs: Simvastatin 40 mg 90 day supply 05/03/2020  Patient's preferred pharmacy is:  [facility], [place] [address] Phone: [phone] Fax: [phone]  Uses pill box? No  Pt endorses good compliance  We discussed: Benefits of medication synchronization, packaging and delivery as well as enhanced pharmacist oversight with [employer]. Patient decided to: Continue current medication management strategy  Care Plan and Follow Up Patient Decision:  Patient agrees to Care Plan and Follow-up.  Plan: Telephone follow up appointment with care management team member scheduled for:  07/2021

## 2020-07-09 LAB — COLOGUARD: COLOGUARD: NEGATIVE

## 2020-07-11 ENCOUNTER — Ambulatory Visit (INDEPENDENT_AMBULATORY_CARE_PROVIDER_SITE_OTHER): Payer: Medicare Other

## 2020-07-11 ENCOUNTER — Other Ambulatory Visit: Payer: Self-pay

## 2020-07-11 DIAGNOSIS — E782 Mixed hyperlipidemia: Secondary | ICD-10-CM

## 2020-07-11 DIAGNOSIS — K219 Gastro-esophageal reflux disease without esophagitis: Secondary | ICD-10-CM

## 2020-07-11 NOTE — Patient Instructions (Addendum)
Visit Information  Goals Addressed            This Visit's Progress   . Manage My Medicine       Timeframe:  Long-Range Goal Priority:  High Start Date:                             Expected End Date:                       Follow Up Date 07/2021    - call for medicine refill 2 or 3 days before it runs out - keep a list of all the medicines I take; vitamins and herbals too    Why is this important?   . These steps will help you keep on track with your medicines.   Notes:     . Perform Foot Care-Diabetes Type 2       Timeframe:  Long-Range Goal Priority:  High Start Date:                             Expected End Date:                       Follow Up Date 07/2021    - check feet daily for cuts, sores or redness - wear comfortable, well-fitting shoes    Why is this important?    Good foot care is very important when you have diabetes.   There are many things you can do to keep your feet healthy and catch a problem early.    Notes:     . Set My Target A1C-Diabetes Type 2       Timeframe:  Long-Range Goal Priority:  High Start Date:                             Expected End Date:                       Follow Up Date 07/2021    - set target A1C    Why is this important?    Your target A1C is decided together by you and your doctor.   It is based on several things like your age and other health issues.    Notes:       Patient Care Plan: CCM Pharmacy Care Plan    Problem Identified: dm, hld, gerd, depression   Priority: High  Onset Date: 07/11/2020    Long-Range Goal: Disease State Management   Start Date: 07/11/2020  Expected End Date: 07/11/2021  This Visit's Progress: On track  Priority: High  Note:     Current Barriers:  . Unable to maintain control of cholesterol  Pharmacist Clinical Goal(s):  Marland Kitchen Patient will achieve control of cholesterol  as evidenced by lipid panel through collaboration with PharmD and provider.   Interventions: . 1:1  collaboration with Blane Ohara, MD regarding development and update of comprehensive plan of care as evidenced by provider attestation and co-signature . Inter-disciplinary care team collaboration (see longitudinal plan of care) . Comprehensive medication review performed; medication list updated in electronic medical record  Hyperlipidemia: (LDL goal < 100) -Controlled -Current treatment: . simvastatin 40 mg daily  -Medications previously tried: none reported  -Current dietary patterns: doing Noom currently and weight loss -Current  exercise habits: walking more since new puppy and daughter are home -Educated on Cholesterol goals;  Benefits of statin for ASCVD risk reduction; Importance of limiting foods high in cholesterol; Exercise goal of 150 minutes per week; -Counseled on diet and exercise extensively Recommended to continue current medication Educated on benefits of healthy diet and exercise.  Prediabetes (A1c goal <6.5%) -Controlled -Current medications: . diet and lifestyle - Dr. Sedalia Muta recommended Metformin 500 mg daily with last lab results -Medications previously tried: none reported  -Current home glucose readings: not checking -Denies hypoglycemic/hyperglycemic symptoms -Current meal patterns: Noom for weight loss support  . Patient reports weight loss and improved food choices since resuming Noom. Updated lab results scheduled for end of June.  -Current exercise: Some exercise - walking with new puppy..  -Educated on A1c and blood sugar goals; Exercise goal of 150 minutes per week; Benefits of weight loss; Carbohydrate counting and/or plate method -Counseled to check feet daily and get yearly eye exams -Counseled on diet and exercise extensively  Depression/Anxiety (Goal: manage depression) -Controlled -Current treatment: . Bupropion sr 150 mg 12 hour tablet bid  . Citalopram 40 mg daily am  -Medications previously tried/failed: patient states she has tried and  failed many alternatives in the past and doing really well with current treatment. She doesn't want to make any changes.  -PHQ9: 3 -Educated on Benefits of medication for symptom control -Assessed symptoms management. Patient reports that she has tried and failed many things in the past and this regimen is working well for her and makes her feel normal.. Patient;s current regimen is working well for her. The recommended max dose is 20 mg/daily >78 years of age.   Restless Leg Syndrome  (Goal: manage symptoms of restless leg syndrome) -Controlled -Current treatment  . Pramipexole 0.5 mg daily -Medications previously tried: none reported  -Collaborated with Dr. Renea Ee nurse to get 90 day supply of pramipexole sent to pharmacy.     Patient Goals/Self-Care Activities . Patient will:  - take medications as prescribed target a minimum of 150 minutes of moderate intensity exercise weekly engage in dietary modifications by continuing to make healthier choices with Noom.   Follow Up Plan: Telephone follow up appointment with care management team member scheduled for: 07/2021      Patient verbalizes understanding of instructions provided today and agrees to view in MyChart.  Telephone follow up appointment with pharmacy team member scheduled for: 07/2021  Earvin Hansen, [facility]  [email or web address]">  DASH Eating Plan DASH stands for Dietary Approaches to Stop Hypertension. The DASH eating plan is a healthy eating plan that has been shown to:  Reduce high blood pressure (hypertension).  Reduce your risk for type 2 diabetes, heart disease, and stroke.  Help with weight loss. What are tips for following this plan? Reading food labels  Check food labels for the amount of salt (sodium) per serving. Choose foods with less than 5 percent of the Daily Value of sodium. Generally, foods with less than 300 milligrams (mg) of sodium per serving fit into  this eating plan.  To find whole grains, look for the word "whole" as the first word in the ingredient list. Shopping  Buy products labeled as "low-sodium" or "no salt added."  Buy fresh foods. Avoid canned foods and pre-made or frozen meals. Cooking  Avoid adding salt when cooking. Use salt-free seasonings or herbs instead of table salt or sea salt. Check with your health care provider or pharmacist before using salt substitutes.  Do  not fry foods. Cook foods using healthy methods such as baking, boiling, grilling, roasting, and broiling instead.  Cook with heart-healthy oils, such as olive, canola, avocado, soybean, or sunflower oil. Meal planning  Eat a balanced diet that includes: ? 4 or more servings of fruits and 4 or more servings of vegetables each day. Try to fill one-half of your plate with fruits and vegetables. ? 6-8 servings of whole grains each day. ? Less than 6 oz (170 g) of lean meat, poultry, or fish each day. A 3-oz (85-g) serving of meat is about the same size as a deck of cards. One egg equals 1 oz (28 g). ? 2-3 servings of low-fat dairy each day. One serving is 1 cup (237 mL). ? 1 serving of nuts, seeds, or beans 5 times each week. ? 2-3 servings of heart-healthy fats. Healthy fats called omega-3 fatty acids are found in foods such as walnuts, flaxseeds, fortified milks, and eggs. These fats are also found in cold-water fish, such as sardines, salmon, and mackerel.  Limit how much you eat of: ? Canned or prepackaged foods. ? Food that is high in trans fat, such as some fried foods. ? Food that is high in saturated fat, such as fatty meat. ? Desserts and other sweets, sugary drinks, and other foods with added sugar. ? Full-fat dairy products.  Do not salt foods before eating.  Do not eat more than 4 egg yolks a week.  Try to eat at least 2 vegetarian meals a week.  Eat more home-cooked food and less restaurant, buffet, and fast food.   Lifestyle  When  eating at a restaurant, ask that your food be prepared with less salt or no salt, if possible.  If you drink alcohol: ? Limit how much you use to:  0-1 drink a day for women who are not pregnant.  0-2 drinks a day for men. ? Be aware of how much alcohol is in your drink. In the [place] one drink equals one 12 oz bottle of beer (355 mL), one 5 oz glass of wine (148 mL), or one 1 oz glass of hard liquor (44 mL). General information  Avoid eating more than 2,300 mg of salt a day. If you have hypertension, you may need to reduce your sodium intake to 1,500 mg a day.  Work with your health care provider to maintain a healthy body weight or to lose weight. Ask what an ideal weight is for you.  Get at least 30 minutes of exercise that causes your heart to beat faster (aerobic exercise) most days of the week. Activities may include walking, swimming, or biking.  Work with your health care provider or dietitian to adjust your eating plan to your individual calorie needs. What foods should I eat? Fruits All fresh, dried, or frozen fruit. Canned fruit in natural juice (without added sugar). Vegetables Fresh or frozen vegetables (raw, steamed, roasted, or grilled). Low-sodium or reduced-sodium tomato and vegetable juice. Low-sodium or reduced-sodium tomato sauce and tomato paste. Low-sodium or reduced-sodium canned vegetables. Grains Whole-grain or whole-wheat bread. Whole-grain or whole-wheat pasta. Linsi Humann rice. Orpah Cobb. Bulgur. Whole-grain and low-sodium cereals. Pita bread. Low-fat, low-sodium crackers. Whole-wheat flour tortillas. Meats and other proteins Skinless chicken or [place]. Ground chicken or [place]. Pork with fat trimmed off. Fish and seafood. Egg whites. Dried beans, peas, or lentils. Unsalted nuts, nut butters, and seeds. Unsalted canned beans. Lean cuts of beef with fat trimmed off. Low-sodium, lean precooked or cured meat, such  as sausages or meat loaves. Dairy Low-fat (1%) or  fat-free (skim) milk. Reduced-fat, low-fat, or fat-free cheeses. Nonfat, low-sodium ricotta or cottage cheese. Low-fat or nonfat yogurt. Low-fat, low-sodium cheese. Fats and oils Soft margarine without trans fats. Vegetable oil. Reduced-fat, low-fat, or light mayonnaise and salad dressings (reduced-sodium). Canola, safflower, olive, avocado, soybean, and sunflower oils. Avocado. Seasonings and condiments Herbs. Spices. Seasoning mixes without salt. Other foods Unsalted popcorn and pretzels. Fat-free sweets. The items listed above may not be a complete list of foods and beverages you can eat. Contact a dietitian for more information. What foods should I avoid? Fruits Canned fruit in a light or heavy syrup. Fried fruit. Fruit in cream or butter sauce. Vegetables Creamed or fried vegetables. Vegetables in a cheese sauce. Regular canned vegetables (not low-sodium or reduced-sodium). Regular canned tomato sauce and paste (not low-sodium or reduced-sodium). Regular tomato and vegetable juice (not low-sodium or reduced-sodium). Rosita FirePickles. Olives. Grains Baked goods made with fat, such as croissants, muffins, or some breads. Dry pasta or rice meal packs. Meats and other proteins Fatty cuts of meat. Ribs. Fried meat. Tomasa BlaseBacon. Bologna, salami, and other precooked or cured meats, such as sausages or meat loaves. Fat from the back of a pig (fatback). Bratwurst. Salted nuts and seeds. Canned beans with added salt. Canned or smoked fish. Whole eggs or egg yolks. Chicken or [place] with skin. Dairy Whole or 2% milk, cream, and half-and-half. Whole or full-fat cream cheese. Whole-fat or sweetened yogurt. Full-fat cheese. Nondairy creamers. Whipped toppings. Processed cheese and cheese spreads. Fats and oils Butter. Stick margarine. Lard. Shortening. Ghee. Bacon fat. Tropical oils, such as coconut, palm kernel, or palm oil. Seasonings and condiments Onion salt, garlic salt, seasoned salt, table salt, and sea salt.  Worcestershire sauce. Tartar sauce. Barbecue sauce. Teriyaki sauce. Soy sauce, including reduced-sodium. Steak sauce. Canned and packaged gravies. Fish sauce. Oyster sauce. Cocktail sauce. Store-bought horseradish. Ketchup. Mustard. Meat flavorings and tenderizers. Bouillon cubes. Hot sauces. Pre-made or packaged marinades. Pre-made or packaged taco seasonings. Relishes. Regular salad dressings. Other foods Salted popcorn and pretzels. The items listed above may not be a complete list of foods and beverages you should avoid. Contact a dietitian for more information. Where to find more information  National Heart, Lung, and Blood Institute: PopSteam.iswww.nhlbi.nih.gov  American Heart Association: www.heart.org  Academy of Nutrition and Dietetics: www.eatright.org  National Kidney Foundation: www.kidney.org Summary  The DASH eating plan is a healthy eating plan that has been shown to reduce high blood pressure (hypertension). It may also reduce your risk for type 2 diabetes, heart disease, and stroke.  When on the DASH eating plan, aim to eat more fresh fruits and vegetables, whole grains, lean proteins, low-fat dairy, and heart-healthy fats.  With the DASH eating plan, you should limit salt (sodium) intake to 2,300 mg a day. If you have hypertension, you may need to reduce your sodium intake to 1,500 mg a day.  Work with your health care provider or dietitian to adjust your eating plan to your individual calorie needs. This information is not intended to replace advice given to you by your health care provider. Make sure you discuss any questions you have with your health care provider. Document Revised: 12/26/2018 Document Reviewed: 12/26/2018 Elsevier Patient Education  2021 [employer]

## 2020-07-27 ENCOUNTER — Other Ambulatory Visit: Payer: Self-pay | Admitting: Family Medicine

## 2020-07-27 DIAGNOSIS — K219 Gastro-esophageal reflux disease without esophagitis: Secondary | ICD-10-CM

## 2020-08-03 ENCOUNTER — Encounter: Payer: Self-pay | Admitting: Family Medicine

## 2020-08-03 ENCOUNTER — Other Ambulatory Visit: Payer: Self-pay

## 2020-08-03 ENCOUNTER — Ambulatory Visit (INDEPENDENT_AMBULATORY_CARE_PROVIDER_SITE_OTHER): Payer: Medicare Other | Admitting: Family Medicine

## 2020-08-03 VITALS — BP 110/70 | HR 76 | Temp 97.3°F | Resp 16 | Ht 65.0 in | Wt 193.6 lb

## 2020-08-03 DIAGNOSIS — S93491A Sprain of other ligament of right ankle, initial encounter: Secondary | ICD-10-CM

## 2020-08-03 DIAGNOSIS — E782 Mixed hyperlipidemia: Secondary | ICD-10-CM

## 2020-08-03 DIAGNOSIS — E1169 Type 2 diabetes mellitus with other specified complication: Secondary | ICD-10-CM | POA: Diagnosis not present

## 2020-08-03 DIAGNOSIS — K219 Gastro-esophageal reflux disease without esophagitis: Secondary | ICD-10-CM | POA: Diagnosis not present

## 2020-08-03 DIAGNOSIS — I10 Essential (primary) hypertension: Secondary | ICD-10-CM

## 2020-08-03 MED ORDER — LANSOPRAZOLE 30 MG PO CPDR: 30.0000 mg | DELAYED_RELEASE_CAPSULE | Freq: Every day | ORAL | 3 refills | Status: DC

## 2020-08-03 MED ORDER — IBUPROFEN 600 MG PO TABS: 600.0000 mg | ORAL_TABLET | Freq: Three times a day (TID) | ORAL | 0 refills | Status: DC | PRN

## 2020-08-03 NOTE — Progress Notes (Signed)
Subjective:  Patient ID: Connie Marks, female    DOB: [birth year]  Age: 75 y.o. MRN: [id number]  Chief Complaint  Patient presents with   Hyperlipidemia   Gastroesophageal Reflux   restless legs    HPI Complaining of right ankle pain. She turned her foot in 2 weeks ago. She has not tried any medicine. She does not feel it has improved.  Diabetes Mellitus Type II, Follow-up: new dx in march 2022. A1C 6.5. Pt has been using Noom and has done well on diet. Lab Results  Component Value Date   HGBA1C 6.0 (H) 08/03/2020   HGBA1C 6.5 (H) 04/29/2020   HGBA1C 6.1 (H) 01/22/2020   Wt Readings from Last 3 Encounters:  08/03/20 193 lb 9.6 oz (87.8 kg)  04/29/20 215 lb (97.5 kg)  01/22/20 209 lb (94.8 kg)   Last seen for diabetes 3 months ago.  Management since then includes noom program. No medicines.  She reports excellent compliance with treatment. She is not having side effects.  Home blood sugar records: not checking.  Episodes of hypoglycemia? none Walking and eating healthy. Using noom.  Hyperlipidemia: currently on zocor.  Depression: currently on celexa and wellbutrin. Trazodone for insomnia.  GERD: well controlled on prevacid. Needs a refill.  RLS on mirapex.   Pertinent Labs: Lab Results  Component Value Date   CHOL 157 08/03/2020   HDL 54 08/03/2020   LDLCALC 80 08/03/2020   TRIG 130 08/03/2020   CHOLHDL 2.9 08/03/2020   Lab Results  Component Value Date   NA 143 08/03/2020   K 4.6 08/03/2020   CREATININE 1.35 (H) 08/03/2020   GFRNONAA 54 (L) 01/22/2020   GFRAA 62 01/22/2020   GLUCOSE 105 (H) 08/03/2020      Current Outpatient Medications on File Prior to Visit  Medication Sig Dispense Refill   aspirin EC 81 MG tablet Take 81 mg by mouth daily. Swallow whole.     buPROPion (WELLBUTRIN SR) 150 MG 12 hr tablet Take 1 tablet (150 mg total) by mouth 2 (two) times daily. 180 tablet 0   Calcium Carbonate+Vitamin D 600-200 MG-UNIT TABS Take 1 tablet by mouth  daily.     citalopram (CELEXA) 40 MG tablet Take 1 tablet (40 mg total) by mouth in the morning. 90 tablet 0   Multiple Vitamins-Minerals (MULTIVITAL PO) Take by mouth.     Polyethyl Glycol-Propyl Glycol (SYSTANE) 0.4-0.3 % SOLN Apply 1 drop to eye daily as needed.     simvastatin (ZOCOR) 40 MG tablet Take 1 tablet (40 mg total) by mouth daily. 90 tablet 1   traZODone (DESYREL) 50 MG tablet Take 0.5-1 tablets (25-50 mg total) by mouth at bedtime as needed for sleep. 30 tablet 3   valACYclovir (VALTREX) 1000 MG tablet Take 2 tablets (2,000 mg total) by mouth 2 (two) times daily. 2 tablet bid at onset 24 tablet 0   No current facility-administered medications on file prior to visit.   Past Medical History:  Diagnosis Date   Anxiety    Depression    GERD (gastroesophageal reflux disease)    Hyperlipidemia    Prediabetes    Sleep apnea    uses CPAP every night   Past Surgical History:  Procedure Laterality Date   CESAREAN SECTION     DIAGNOSTIC LAPAROSCOPY     HERNIA REPAIR     JOINT REPLACEMENT     knee replacements bilat   JOINT REPLACEMENT     hip replacements bilat   ORIF  ORBITAL FRACTURE Right 12/19/2018   Procedure: OPEN TREATMENT RIGHT ORBITAL FLOOR WITH IMPLANT, PERIORBITAL APPROACH;  Surgeon: Glenna Fellowshimmappa, Brinda, MD;  Location: [facility];  Service: Plastics;  Laterality: Right;   ORIF ORBITAL FRACTURE Right 02/03/2019   Procedure: OPEN TREATMENT RIGHT ORBITAL FLOOR FRACTURE REVISION, PERI ORBITAL APPROACH;  Surgeon: Glenna Fellowshimmappa, Brinda, MD;  Location: [facility];  Service: Plastics;  Laterality: Right;    History reviewed. No pertinent family history. Social History   Socioeconomic History   Marital status: Divorced    Spouse name: Not on file   Number of children: Not on file   Years of education: Not on file   Highest education level: Not on file  Occupational History   Not on file  Tobacco Use   Smoking status: Former    Pack years:  0.00    Types: Cigarettes    Quit date: 12/18/1985    Years since quitting: 34.6   Smokeless tobacco: Never  Vaping Use   Vaping Use: Never used  Substance and Sexual Activity   Alcohol use: Yes    Alcohol/week: 3.0 standard drinks    Types: 2 Glasses of wine, 1 Shots of liquor per week    Comment: 2 glasses /week   Drug use: Never   Sexual activity: Not on file  Other Topics Concern   Not on file  Social History Narrative   Not on file   Social Determinants of Health   Financial Resource Strain: Not on file  Food Insecurity: No Food Insecurity   Worried About Running Out of Food in the Last Year: Never true   Ran Out of Food in the Last Year: Never true  Transportation Needs: No Transportation Needs   Lack of Transportation (Medical): No   Lack of Transportation (Non-Medical): No  Physical Activity: Not on file  Stress: Not on file  Social Connections: Not on file    Review of Systems  Constitutional:  Negative for chills, fatigue and fever.  HENT:  Negative for congestion, rhinorrhea and sore throat.   Eyes:  Positive for visual disturbance.  Respiratory:  Negative for cough and shortness of breath.   Cardiovascular:  Negative for chest pain.  Gastrointestinal:  Negative for abdominal pain, constipation, diarrhea, nausea and vomiting.  Genitourinary:  Positive for urgency. Negative for dysuria.  Musculoskeletal:  Positive for arthralgias (right ankle pain). Negative for back pain and myalgias.  Neurological:  Negative for dizziness, weakness, light-headedness and headaches.  Psychiatric/Behavioral:  Negative for dysphoric mood. The patient is not nervous/anxious.     Objective:  BP 110/70   Pulse 76   Temp (!) 97.3 F (36.3 C)   Resp 16   Ht 5\' 5"  (1.651 m)   Wt 193 lb 9.6 oz (87.8 kg)   BMI 32.22 kg/m   BP/Weight 08/03/2020 04/29/2020 01/22/2020  Systolic BP 110 134 124  Diastolic BP 70 84 72  Wt. (Lbs) 193.6 215 209  BMI 32.22 35.78 34.78    Physical  Exam Constitutional:      Appearance: Normal appearance. She is normal weight.  HENT:     Nose: No congestion or rhinorrhea.  Neck:     Vascular: No carotid bruit.  Cardiovascular:     Rate and Rhythm: Normal rate and regular rhythm.     Pulses: Normal pulses.     Heart sounds: Normal heart sounds.  Pulmonary:     Effort: Pulmonary effort is normal.     Breath sounds: Normal breath  sounds.  Abdominal:     General: Bowel sounds are normal.     Palpations: Abdomen is soft.  Musculoskeletal:        General: Swelling (right ankle. medial. tender. nontender over BL malleoli) present.  Skin:    General: Skin is warm and dry.  Neurological:     Mental Status: She is alert and oriented to person, place, and time.  Psychiatric:        Mood and Affect: Mood normal.        Behavior: Behavior normal.   Diabetic Foot Exam - Simple   Simple Foot Form Diabetic Foot exam was performed with the following findings: Yes 08/03/2020 10:09 AM  Visual Inspection See comments: Yes Sensation Testing Intact to touch and monofilament testing bilaterally: Yes Pulse Check Posterior Tibialis and Dorsalis pulse intact bilaterally: Yes Comments Pes planus BL. Swelling rt medial ankle.      Lab Results  Component Value Date   WBC 6.8 08/03/2020   HGB 13.0 08/03/2020   HCT 39.8 08/03/2020   PLT 179 08/03/2020   GLUCOSE 105 (H) 08/03/2020   CHOL 157 08/03/2020   TRIG 130 08/03/2020   HDL 54 08/03/2020   LDLCALC 80 08/03/2020   ALT 15 08/03/2020   AST 20 08/03/2020   NA 143 08/03/2020   K 4.6 08/03/2020   CL 105 08/03/2020   CREATININE 1.35 (H) 08/03/2020   BUN 14 08/03/2020   CO2 21 08/03/2020   HGBA1C 6.0 (H) 08/03/2020      Assessment & Plan:   1. Mixed hyperlipidemia Well controlled.  No changes to medicines.  Continue to work on eating a healthy diet and exercise.  Labs drawn today.  - FLP  2. Essential hypertension Well controlled.  No changes to medicines.  Continue to  work on eating a healthy diet and exercise.  Labs drawn today.  - CBC with Differential/Platelet - Comprehensive metabolic panel  3. GERD without esophagitis The current medical regimen is effective;  continue present plan and medications. - lansoprazole (PREVACID) 30 MG capsule; Take 1 capsule (30 mg total) by mouth daily at 12 noon.  Dispense: 90 capsule; Refill: 3  4. Combined hyperlipidemia associated with type 2 diabetes mellitus (HCC) Control: good. Improved.  Recommend check feet daily. Recommend annual eye exams. Continue to work on eating a healthy diet and exercise.  Labs drawn today.   - Hemoglobin A1c - Lipid panel - Cardiovascular Risk Assessment  5. Sprain of other ligament of right ankle, initial encounter Exam is not concerning for ankle fracture.  Start on ibuprofen 600 mg one three times a day. Take for 2 weeks. If not improving, pt to call.  - ibuprofen (ADVIL) 600 MG tablet; Take 1 tablet (600 mg total) by mouth every 8 (eight) hours as needed.  Dispense: 90 tablet; Refill: 0   Meds ordered this encounter  Medications   ibuprofen (ADVIL) 600 MG tablet    Sig: Take 1 tablet (600 mg total) by mouth every 8 (eight) hours as needed.    Dispense:  90 tablet    Refill:  0   lansoprazole (PREVACID) 30 MG capsule    Sig: Take 1 capsule (30 mg total) by mouth daily at 12 noon.    Dispense:  90 capsule    Refill:  3    Orders Placed This Encounter  Procedures   CBC with Differential/Platelet   Comprehensive metabolic panel   Hemoglobin A1c   Lipid panel   Cardiovascular Risk Assessment  Follow-up: Return in about 3 months (around 11/03/2020) for fasting.  An After Visit Summary was printed and given to the patient.  Blane Ohara, MD [facility] [phone]

## 2020-08-04 LAB — CBC WITH DIFFERENTIAL/PLATELET
Basophils Absolute: 0.1 10*3/uL (ref 0.0–0.2)
Basos: 1 %
EOS (ABSOLUTE): 0.2 10*3/uL (ref 0.0–0.4)
Eos: 4 %
Hematocrit: 39.8 % (ref 34.0–46.6)
Hemoglobin: 13 g/dL (ref 11.1–15.9)
Immature Grans (Abs): 0 10*3/uL (ref 0.0–0.1)
Immature Granulocytes: 0 %
Lymphocytes Absolute: 1.6 10*3/uL (ref 0.7–3.1)
Lymphs: 24 %
MCH: 29.7 pg (ref 26.6–33.0)
MCHC: 32.7 g/dL (ref 31.5–35.7)
MCV: 91 fL (ref 79–97)
Monocytes Absolute: 0.7 10*3/uL (ref 0.1–0.9)
Monocytes: 10 %
Neutrophils Absolute: 4.1 10*3/uL (ref 1.4–7.0)
Neutrophils: 61 %
Platelets: 179 10*3/uL (ref 150–450)
RBC: 4.38 x10E6/uL (ref 3.77–5.28)
RDW: 14.1 % (ref 11.7–15.4)
WBC: 6.8 10*3/uL (ref 3.4–10.8)

## 2020-08-04 LAB — COMPREHENSIVE METABOLIC PANEL
ALT: 15 IU/L (ref 0–32)
AST: 20 IU/L (ref 0–40)
Albumin/Globulin Ratio: 1.8 (ref 1.2–2.2)
Albumin: 4.2 g/dL (ref 3.7–4.7)
Alkaline Phosphatase: 66 IU/L (ref 44–121)
BUN/Creatinine Ratio: 10 — ABNORMAL LOW (ref 12–28)
BUN: 14 mg/dL (ref 8–27)
Bilirubin Total: 0.4 mg/dL (ref 0.0–1.2)
CO2: 21 mmol/L (ref 20–29)
Calcium: 9.4 mg/dL (ref 8.7–10.3)
Chloride: 105 mmol/L (ref 96–106)
Creatinine, Ser: 1.35 mg/dL — ABNORMAL HIGH (ref 0.57–1.00)
Globulin, Total: 2.4 g/dL (ref 1.5–4.5)
Glucose: 105 mg/dL — ABNORMAL HIGH (ref 65–99)
Potassium: 4.6 mmol/L (ref 3.5–5.2)
Sodium: 143 mmol/L (ref 134–144)
Total Protein: 6.6 g/dL (ref 6.0–8.5)
eGFR: 41 mL/min/{1.73_m2} — ABNORMAL LOW (ref >59)

## 2020-08-04 LAB — HEMOGLOBIN A1C
Est. average glucose Bld gHb Est-mCnc: 126 mg/dL
Hgb A1c MFr Bld: 6 % — ABNORMAL HIGH (ref 4.8–5.6)

## 2020-08-04 LAB — LIPID PANEL
Chol/HDL Ratio: 2.9 ratio (ref 0.0–4.4)
Cholesterol, Total: 157 mg/dL (ref 100–199)
HDL: 54 mg/dL (ref >39)
LDL Chol Calc (NIH): 80 mg/dL (ref 0–99)
Triglycerides: 130 mg/dL (ref 0–149)
VLDL Cholesterol Cal: 23 mg/dL (ref 5–40)

## 2020-08-04 LAB — CARDIOVASCULAR RISK ASSESSMENT

## 2020-08-06 ENCOUNTER — Other Ambulatory Visit: Payer: Self-pay | Admitting: Legal Medicine

## 2020-08-18 ENCOUNTER — Other Ambulatory Visit: Payer: Self-pay

## 2020-08-18 ENCOUNTER — Ambulatory Visit (INDEPENDENT_AMBULATORY_CARE_PROVIDER_SITE_OTHER): Payer: Medicare Other | Admitting: Family Medicine

## 2020-08-18 VITALS — BP 104/66 | HR 68 | Temp 97.1°F | Ht 65.0 in | Wt 197.0 lb

## 2020-08-18 DIAGNOSIS — M25571 Pain in right ankle and joints of right foot: Secondary | ICD-10-CM

## 2020-08-18 DIAGNOSIS — N289 Disorder of kidney and ureter, unspecified: Secondary | ICD-10-CM | POA: Diagnosis not present

## 2020-08-18 NOTE — Progress Notes (Signed)
Subjective:  Patient ID: Connie Marks, female    DOB: [birth year]  Age: 75 y.o. MRN: [id number]  Chief Complaint  Patient presents with   Follow-up  Ankle pain  HPI 2 week f/u- right ankle pain has improved, has been wearing brace.Not fully better. Did not take ibuprofen that I prescribed as I had her hold it after her creatinine came back elevated. She used voltaren gel instead which helped. Previously saw a podiatrist here in [place] and then in  for her very flat feet. She did not care for podiatrist in [place], and apparently waited for 1 1/2 hours between the waiting room and the patient room in [facility] and finally had to leave. So never was actually seen in [place] by podiatry. She can not remember the names of these doctors.   Per labs done on 08/04/2020 patient was to follow up in 2-3 weeks to follow up for CMP.  Current Outpatient Medications on File Prior to Visit  Medication Sig Dispense Refill   aspirin EC 81 MG tablet Take 81 mg by mouth daily. Swallow whole.     buPROPion (WELLBUTRIN SR) 150 MG 12 hr tablet Take 1 tablet (150 mg total) by mouth 2 (two) times daily. 180 tablet 0   Calcium Carbonate+Vitamin D 600-200 MG-UNIT TABS Take 1 tablet by mouth daily.     citalopram (CELEXA) 40 MG tablet Take 1 tablet (40 mg total) by mouth in the morning. 90 tablet 0   ibuprofen (ADVIL) 600 MG tablet Take 1 tablet (600 mg total) by mouth every 8 (eight) hours as needed. 90 tablet 0   lansoprazole (PREVACID) 30 MG capsule Take 1 capsule (30 mg total) by mouth daily at 12 noon. 90 capsule 3   Multiple Vitamins-Minerals (MULTIVITAL PO) Take by mouth.     Polyethyl Glycol-Propyl Glycol (SYSTANE) 0.4-0.3 % SOLN Apply 1 drop to eye daily as needed.     pramipexole (MIRAPEX) 0.5 MG tablet TAKE ONE TABLET BY MOUTH THREE TIMES DAILY 30 tablet 3   simvastatin (ZOCOR) 40 MG tablet Take 1 tablet (40 mg total) by mouth daily. 90 tablet 1   traZODone (DESYREL) 50 MG tablet Take  0.5-1 tablets (25-50 mg total) by mouth at bedtime as needed for sleep. 30 tablet 3   valACYclovir (VALTREX) 1000 MG tablet Take 2 tablets (2,000 mg total) by mouth 2 (two) times daily. 2 tablet bid at onset 24 tablet 0   No current facility-administered medications on file prior to visit.   Past Medical History:  Diagnosis Date   Anxiety    Depression    GERD (gastroesophageal reflux disease)    Hyperlipidemia    Prediabetes    Sleep apnea    uses CPAP every night   Past Surgical History:  Procedure Laterality Date   CESAREAN SECTION     DIAGNOSTIC LAPAROSCOPY     HERNIA REPAIR     JOINT REPLACEMENT     knee replacements bilat   JOINT REPLACEMENT     hip replacements bilat   ORIF ORBITAL FRACTURE Right 12/19/2018   Procedure: OPEN TREATMENT RIGHT ORBITAL FLOOR WITH IMPLANT, PERIORBITAL APPROACH;  Surgeon: Glenna Fellows, MD;  Location: [facility];  Service: Plastics;  Laterality: Right;   ORIF ORBITAL FRACTURE Right 02/03/2019   Procedure: OPEN TREATMENT RIGHT ORBITAL FLOOR FRACTURE REVISION, PERI ORBITAL APPROACH;  Surgeon: Glenna Fellows, MD;  Location: [facility];  Service: Plastics;  Laterality: Right;    No family history on file.  Social History   Socioeconomic History   Marital status: Divorced    Spouse name: Not on file   Number of children: Not on file   Years of education: Not on file   Highest education level: Not on file  Occupational History   Not on file  Tobacco Use   Smoking status: Former    Types: Cigarettes    Quit date: 12/18/1985    Years since quitting: 34.6   Smokeless tobacco: Never  Vaping Use   Vaping Use: Never used  Substance and Sexual Activity   Alcohol use: Yes    Alcohol/week: 3.0 standard drinks    Types: 2 Glasses of wine, 1 Shots of liquor per week    Comment: 2 glasses /week   Drug use: Never   Sexual activity: Not on file  Other Topics Concern   Not on file  Social History Narrative    Not on file   Social Determinants of Health   Financial Resource Strain: Not on file  Food Insecurity: No Food Insecurity   Worried About Running Out of Food in the Last Year: Never true   Ran Out of Food in the Last Year: Never true  Transportation Needs: No Transportation Needs   Lack of Transportation (Medical): No   Lack of Transportation (Non-Medical): No  Physical Activity: Not on file  Stress: Not on file  Social Connections: Not on file    Review of Systems  Constitutional:  Negative for chills, fatigue and fever.  HENT:  Negative for congestion, ear pain, rhinorrhea and sore throat.   Respiratory:  Negative for cough and shortness of breath.   Cardiovascular:  Negative for chest pain.  Musculoskeletal:  Positive for arthralgias (Right ankle pain-improving).    Objective:  BP 104/66   Pulse 68   Temp (!) 97.1 F (36.2 C)   Ht 5\' 5"  (1.651 m)   Wt 197 lb (89.4 kg)   SpO2 100%   BMI 32.78 kg/m   BP/Weight 08/18/2020 08/03/2020 04/29/2020  Systolic BP 104 110 134  Diastolic BP 66 70 84  Wt. (Lbs) 197 193.6 215  BMI 32.78 32.22 35.78    Physical Exam Vitals reviewed.  Constitutional:      Appearance: Normal appearance.  Musculoskeletal:        General: Tenderness (medial rt ankle anterior and inferior to medial malleoli. edema had decreased no erythema.) present.  Neurological:     Mental Status: She is alert and oriented to person, place, and time.  Psychiatric:        Mood and Affect: Mood normal.        Behavior: Behavior normal.    Diabetic Foot Exam - Simple   No data filed      Lab Results  Component Value Date   WBC 6.8 08/03/2020   HGB 13.0 08/03/2020   HCT 39.8 08/03/2020   PLT 179 08/03/2020   GLUCOSE 106 (H) 08/18/2020   CHOL 157 08/03/2020   TRIG 130 08/03/2020   HDL 54 08/03/2020   LDLCALC 80 08/03/2020   ALT 13 08/18/2020   AST 15 08/18/2020   NA 140 08/18/2020   K 5.5 (H) 08/18/2020   CL 103 08/18/2020   CREATININE 1.06 (H)  08/18/2020   BUN 14 08/18/2020   CO2 24 08/18/2020   HGBA1C 6.0 (H) 08/03/2020      Assessment & Plan:   1. Abnormal kidney function - Comprehensive metabolic panel  2. Acute right ankle pain  Continue voltaren  gel.  If persistent issues, please call back and will send to podiatry.  Prefers no PT at this time.    Orders Placed This Encounter  Procedures   Comprehensive metabolic panel   Follow-up: Return if symptoms worsen or fail to improve.  An After Visit Summary was printed and given to the patient.  Blane Ohara, MD [facility] [phone]

## 2020-08-19 LAB — COMPREHENSIVE METABOLIC PANEL
ALT: 13 IU/L (ref 0–32)
AST: 15 IU/L (ref 0–40)
Albumin/Globulin Ratio: 1.8 (ref 1.2–2.2)
Albumin: 4.6 g/dL (ref 3.7–4.7)
Alkaline Phosphatase: 79 IU/L (ref 44–121)
BUN/Creatinine Ratio: 13 (ref 12–28)
BUN: 14 mg/dL (ref 8–27)
Bilirubin Total: 0.3 mg/dL (ref 0.0–1.2)
CO2: 24 mmol/L (ref 20–29)
Calcium: 9.9 mg/dL (ref 8.7–10.3)
Chloride: 103 mmol/L (ref 96–106)
Creatinine, Ser: 1.06 mg/dL — ABNORMAL HIGH (ref 0.57–1.00)
Globulin, Total: 2.5 g/dL (ref 1.5–4.5)
Glucose: 106 mg/dL — ABNORMAL HIGH (ref 65–99)
Potassium: 5.5 mmol/L — ABNORMAL HIGH (ref 3.5–5.2)
Sodium: 140 mmol/L (ref 134–144)
Total Protein: 7.1 g/dL (ref 6.0–8.5)
eGFR: 55 mL/min/{1.73_m2} — ABNORMAL LOW (ref >59)

## 2020-08-21 ENCOUNTER — Encounter: Payer: Self-pay | Admitting: Family Medicine

## 2020-08-22 ENCOUNTER — Other Ambulatory Visit: Payer: Self-pay | Admitting: Physician Assistant

## 2020-08-22 DIAGNOSIS — F330 Major depressive disorder, recurrent, mild: Secondary | ICD-10-CM

## 2020-08-25 ENCOUNTER — Other Ambulatory Visit: Payer: Medicare Other

## 2020-08-25 ENCOUNTER — Other Ambulatory Visit: Payer: Self-pay

## 2020-08-25 DIAGNOSIS — R799 Abnormal finding of blood chemistry, unspecified: Secondary | ICD-10-CM | POA: Diagnosis not present

## 2020-08-26 LAB — COMPREHENSIVE METABOLIC PANEL
ALT: 15 IU/L (ref 0–32)
AST: 19 IU/L (ref 0–40)
Albumin/Globulin Ratio: 2 (ref 1.2–2.2)
Albumin: 4.5 g/dL (ref 3.7–4.7)
Alkaline Phosphatase: 60 IU/L (ref 44–121)
BUN/Creatinine Ratio: 12 (ref 12–28)
BUN: 12 mg/dL (ref 8–27)
Bilirubin Total: 0.5 mg/dL (ref 0.0–1.2)
CO2: 23 mmol/L (ref 20–29)
Calcium: 9.6 mg/dL (ref 8.7–10.3)
Chloride: 106 mmol/L (ref 96–106)
Creatinine, Ser: 1 mg/dL (ref 0.57–1.00)
Globulin, Total: 2.2 g/dL (ref 1.5–4.5)
Glucose: 116 mg/dL — ABNORMAL HIGH (ref 65–99)
Potassium: 5.1 mmol/L (ref 3.5–5.2)
Sodium: 144 mmol/L (ref 134–144)
Total Protein: 6.7 g/dL (ref 6.0–8.5)
eGFR: 59 mL/min/{1.73_m2} — ABNORMAL LOW (ref >59)

## 2020-09-05 DIAGNOSIS — H26491 Other secondary cataract, right eye: Secondary | ICD-10-CM | POA: Diagnosis not present

## 2020-09-05 DIAGNOSIS — H2512 Age-related nuclear cataract, left eye: Secondary | ICD-10-CM | POA: Diagnosis not present

## 2020-09-05 DIAGNOSIS — S0231XS Fracture of orbital floor, right side, sequela: Secondary | ICD-10-CM | POA: Diagnosis not present

## 2020-09-05 DIAGNOSIS — H43813 Vitreous degeneration, bilateral: Secondary | ICD-10-CM | POA: Diagnosis not present

## 2020-09-05 DIAGNOSIS — Z961 Presence of intraocular lens: Secondary | ICD-10-CM | POA: Diagnosis not present

## 2020-09-05 LAB — HM DIABETES EYE EXAM

## 2020-09-06 ENCOUNTER — Other Ambulatory Visit: Payer: Self-pay | Admitting: Physician Assistant

## 2020-09-06 DIAGNOSIS — F330 Major depressive disorder, recurrent, mild: Secondary | ICD-10-CM

## 2020-10-07 ENCOUNTER — Telehealth: Payer: Self-pay

## 2020-10-07 NOTE — Chronic Care Management (AMB) (Signed)
Chronic Care Management Pharmacy Assistant   Name: Connie Marks  MRN: [id number] DOB: 31-Mar-1945   Reason for Encounter: Disease State/ Diabetes  Recent office visits:  08-18-2020 Connie Brome, MD. Glucose= 106, Creatinine= 1.06, eGFR= 55, Potassium= 5.5.  08-03-2020 Connie Marks, Kirsten, MD. Glucose= 105, Creatinine= 1.35, eGFR= 41, BUN/ Creatinine= 10. A1C= 6.0. START Ibuprofen 600 mg every 8 hours as needed.  Recent consult visits:  None  Hospital visits:  None in previous 6 months  Medications: Outpatient Encounter Medications as of 10/07/2020  Medication Sig   aspirin EC 81 MG tablet Take 81 mg by mouth daily. Swallow whole.   buPROPion (WELLBUTRIN SR) 150 MG 12 hr tablet TAKE ONE TABLET BY MOUTH TWICE DAILY   Calcium Carbonate+Vitamin D 600-200 MG-UNIT TABS Take 1 tablet by mouth daily.   citalopram (CELEXA) 40 MG tablet Take 1 tablet (40 mg total) by mouth in the morning.   ibuprofen (ADVIL) 600 MG tablet Take 1 tablet (600 mg total) by mouth every 8 (eight) hours as needed.   lansoprazole (PREVACID) 30 MG capsule Take 1 capsule (30 mg total) by mouth daily at 12 noon.   Multiple Vitamins-Minerals (MULTIVITAL PO) Take by mouth.   Polyethyl Glycol-Propyl Glycol (SYSTANE) 0.4-0.3 % SOLN Apply 1 drop to eye daily as needed.   pramipexole (MIRAPEX) 0.5 MG tablet TAKE ONE TABLET BY MOUTH THREE TIMES DAILY   simvastatin (ZOCOR) 40 MG tablet Take 1 tablet (40 mg total) by mouth daily.   traZODone (DESYREL) 50 MG tablet Take 0.5-1 tablets (25-50 mg total) by mouth at bedtime as needed for sleep.   valACYclovir (VALTREX) 1000 MG tablet Take 2 tablets (2,000 mg total) by mouth 2 (two) times daily. 2 tablet bid at onset   No facility-administered encounter medications on file as of 10/07/2020.   Recent Relevant Labs: Lab Results  Component Value Date/Time   HGBA1C 6.0 (H) 08/03/2020 10:30 AM   HGBA1C 6.5 (H) 04/29/2020 08:53 AM    Kidney Function Lab Results  Component Value  Date/Time   CREATININE 1.00 08/25/2020 11:41 AM   CREATININE 1.06 (H) 08/18/2020 11:58 AM   GFRNONAA 54 (L) 01/22/2020 09:37 AM   GFRAA 62 01/22/2020 09:37 AM     Current antihyperglycemic regimen:  None  What recent interventions/DTPs have been made to improve glycemic control:  Educated on A1c and blood sugar goals Exercise goal of 150 minutes per week Benefits of weight loss Carbohydrate counting and/or plate method Counseled to check feet daily and get yearly eye exams Counseled on diet and exercise extensively  Have there been any recent hospitalizations or ED visits since last visit with CPP? No  Patient denies hypoglycemic symptoms  Patient denies hyperglycemic symptoms  How often are you checking your blood sugar? Patient stated she doesn't check blood sugar  What are your blood sugars ranging?  Fasting: None Before meals: None After meals: None Bedtime: None  On insulin? No  During the week, how often does your blood glucose drop below 70? Patient reported never to her knowledge.  Are you checking your feet daily/regularly? Yes  Adherence Review: Is the patient currently on a STATIN medication? Yes Is the patient currently on ACE/ARB medication? No Does the patient have >5 day gap between last estimated fill dates? CPP to review  Care Gaps: Last eye exam / Retinopathy Screening? 09-19-2020 Last Annual Wellness Visit? 01-20-2020 Last Diabetic Foot Exam? Needs to reschedule appointment Sh[facility]rix overdue Hep C screening overdue  Star Rating Drugs:  Medication:  Last Fill: Day Supply Simvastatin 40 mg 08-13-2020 [place] Clinical Pharmacist Assistant [phone]

## 2020-11-03 ENCOUNTER — Other Ambulatory Visit: Payer: Self-pay | Admitting: Family Medicine

## 2020-11-03 DIAGNOSIS — G4709 Other insomnia: Secondary | ICD-10-CM

## 2020-11-03 DIAGNOSIS — E782 Mixed hyperlipidemia: Secondary | ICD-10-CM

## 2020-11-07 ENCOUNTER — Telehealth: Payer: Self-pay

## 2020-11-07 NOTE — Chronic Care Management (AMB) (Signed)
    Chronic Care Management Pharmacy Assistant   Name: Connie Marks  MRN: [id number] DOB: Feb 19, 1945   Reason for Encounter: Disease State/ Diabetes    Recent office visits:  None  Recent consult visits:  None  Hospital visits:  None in previous 6 months  Medications: Outpatient Encounter Medications as of 11/07/2020  Medication Sig   aspirin EC 81 MG tablet Take 81 mg by mouth daily. Swallow whole.   buPROPion (WELLBUTRIN SR) 150 MG 12 hr tablet TAKE ONE TABLET BY MOUTH TWICE DAILY   Calcium Carbonate+Vitamin D 600-200 MG-UNIT TABS Take 1 tablet by mouth daily.   citalopram (CELEXA) 40 MG tablet Take 1 tablet (40 mg total) by mouth in the morning.   ibuprofen (ADVIL) 600 MG tablet Take 1 tablet (600 mg total) by mouth every 8 (eight) hours as needed.   lansoprazole (PREVACID) 30 MG capsule Take 1 capsule (30 mg total) by mouth daily at 12 noon.   Multiple Vitamins-Minerals (MULTIVITAL PO) Take by mouth.   Polyethyl Glycol-Propyl Glycol (SYSTANE) 0.4-0.3 % SOLN Apply 1 drop to eye daily as needed.   pramipexole (MIRAPEX) 0.5 MG tablet TAKE ONE TABLET BY MOUTH THREE TIMES DAILY   simvastatin (ZOCOR) 40 MG tablet TAKE ONE TABLET BY MOUTH DAILY   traZODone (DESYREL) 50 MG tablet TAKE 1/2 TO 1 TABLET BY MOUTH AT BEDTIME AS NEEDED FOR SLEEP   valACYclovir (VALTREX) 1000 MG tablet Take 2 tablets (2,000 mg total) by mouth 2 (two) times daily. 2 tablet bid at onset   No facility-administered encounter medications on file as of 11/07/2020.   Lipid Panel    Component Value Date/Time   CHOL 157 08/03/2020 1030   TRIG 130 08/03/2020 1030   HDL 54 08/03/2020 1030   LDLCALC 80 08/03/2020 1030    11-07-2020: 1st attempt left VM 11-14-2020: 2nd attempt left VM 11-21-2020: 3rd attempt left VM  Star Rating Drugs: Simvastatin 40 mg- Last filled 11-03-2020 90 DS  Care Gaps: Last annual wellness visit? 01-20-2020 Yearly ophthalmology overdue Hep C screening overdue Shingrix  overdue  Huey Romans [facility] Clinical Pharmacist Assistant [phone]

## 2020-11-09 NOTE — Progress Notes (Signed)
Subjective:  Patient ID: Connie Marks, female    DOB: 05/08/1945  Age: 75 y.o. MRN: [id number]  Chief Complaint  Patient presents with   Hyperlipidemia   Hypertension    HPI PreDiabetes:  Most recent A1C: 6.0 (08/03/20)  Hyperlipidemia: Current medications: simvastatin 40 mg daily,   Hypertension: Current medications: no current treatment.   Diet: Continuing [place] since March has lost approximately 30 pounds  Exercise:  Depression: wellbutrin SR 150 mg one twice a day and citalopram 40 mg once daily.   GERD: prevacid 30 mg once daily and pepcid 40 mg once daily at Bedtime.   RT ankle pain: pt has seen two podiatrists over the last year. She is currently doing exercises which are helping some. She would like to continue the exercises and will call if wishes to see orthopedics (Dr. Victorino Dike is my recommendation.)  RLS: mirapex 0.5 mg one qhs.    OSA/Insomnia: trazodone is not keeping her asleep, but this may be due to not having cpap machine. She was traveling and accidentally left it in a hotel and they have been unable to find it. Pt has not had sleep study for 8 yrs.    Current Outpatient Medications on File Prior to Visit  Medication Sig Dispense Refill   aspirin EC 81 MG tablet Take 81 mg by mouth daily. Swallow whole.     buPROPion (WELLBUTRIN SR) 150 MG 12 hr tablet TAKE ONE TABLET BY MOUTH TWICE DAILY 180 tablet 1   Calcium Carbonate+Vitamin D 600-200 MG-UNIT TABS Take 1 tablet by mouth daily.     famotidine (PEPCID) 40 MG tablet Take 40 mg by mouth at bedtime.     lansoprazole (PREVACID) 30 MG capsule Take 1 capsule (30 mg total) by mouth daily at 12 noon. 90 capsule 3   Multiple Vitamins-Minerals (MULTIVITAL PO) Take by mouth.     Polyethyl Glycol-Propyl Glycol (SYSTANE) 0.4-0.3 % SOLN Apply 1 drop to eye daily as needed.     pramipexole (MIRAPEX) 0.5 MG tablet TAKE ONE TABLET BY MOUTH THREE TIMES DAILY 30 tablet 3   simvastatin (ZOCOR) 40 MG tablet TAKE ONE  TABLET BY MOUTH DAILY 90 tablet 1   traZODone (DESYREL) 50 MG tablet TAKE 1/2 TO 1 TABLET BY MOUTH AT BEDTIME AS NEEDED FOR SLEEP 90 tablet 1   valACYclovir (VALTREX) 1000 MG tablet Take 2 tablets (2,000 mg total) by mouth 2 (two) times daily. 2 tablet bid at onset 24 tablet 0   No current facility-administered medications on file prior to visit.   Past Medical History:  Diagnosis Date   Anxiety    Depression    GERD (gastroesophageal reflux disease)    Hyperlipidemia    Prediabetes    Sleep apnea    uses CPAP every night   Past Surgical History:  Procedure Laterality Date   CESAREAN SECTION     DIAGNOSTIC LAPAROSCOPY     HERNIA REPAIR     JOINT REPLACEMENT     knee replacements bilat   JOINT REPLACEMENT     hip replacements bilat   ORIF ORBITAL FRACTURE Right 12/19/2018   Procedure: OPEN TREATMENT RIGHT ORBITAL FLOOR WITH IMPLANT, PERIORBITAL APPROACH;  Surgeon: Glenna Fellows, MD;  Location: [facility];  Service: Plastics;  Laterality: Right;   ORIF ORBITAL FRACTURE Right 02/03/2019   Procedure: OPEN TREATMENT RIGHT ORBITAL FLOOR FRACTURE REVISION, PERI ORBITAL APPROACH;  Surgeon: Glenna Fellows, MD;  Location: [facility];  Service: Plastics;  Laterality: Right;  History reviewed. No pertinent family history. Social History   Socioeconomic History   Marital status: Divorced    Spouse name: Not on file   Number of children: Not on file   Years of education: Not on file   Highest education level: Not on file  Occupational History   Not on file  Tobacco Use   Smoking status: Former    Types: Cigarettes    Quit date: 12/18/1985    Years since quitting: 34.9   Smokeless tobacco: Never  Vaping Use   Vaping Use: Never used  Substance and Sexual Activity   Alcohol use: Yes    Alcohol/week: 3.0 standard drinks    Types: 2 Glasses of wine, 1 Shots of liquor per week    Comment: 2 glasses /week   Drug use: Never   Sexual activity:  Not on file  Other Topics Concern   Not on file  Social History Narrative   Not on file   Social Determinants of Health   Financial Resource Strain: Not on file  Food Insecurity: No Food Insecurity   Worried About Running Out of Food in the Last Year: Never true   Ran Out of Food in the Last Year: Never true  Transportation Needs: No Transportation Needs   Lack of Transportation (Medical): No   Lack of Transportation (Non-Medical): No  Physical Activity: Not on file  Stress: Not on file  Social Connections: Not on file    Review of Systems  Constitutional:  Negative for chills, fatigue and fever.  HENT:  Negative for congestion, ear pain and sore throat.   Respiratory:  Negative for cough and shortness of breath.   Cardiovascular:  Negative for chest pain.  Gastrointestinal:  Negative for abdominal pain, constipation, diarrhea, nausea and vomiting.  Endocrine: Negative for polydipsia, polyphagia and polyuria.  Genitourinary:  Negative for dysuria and frequency.  Musculoskeletal:  Positive for arthralgias and myalgias. Negative for back pain.  Skin:  Positive for rash (R groin).  Neurological:  Negative for headaches.  Psychiatric/Behavioral:  Negative for dysphoric mood. The patient is not nervous/anxious.     Objective:  BP 108/68   Pulse 67   Temp (!) 97.3 F (36.3 C)   Resp 18   Ht 5\' 5"  (1.651 m)   Wt 185 lb (83.9 kg)   SpO2 97%   BMI 30.79 kg/m   BP/Weight 11/10/2020 08/18/2020 08/03/2020  Systolic BP 108 104 110  Diastolic BP 68 66 70  Wt. (Lbs) 185 197 193.6  BMI 30.79 32.78 32.22    Physical Exam Vitals reviewed.  Constitutional:      Appearance: Normal appearance. She is normal weight.  Neck:     Vascular: No carotid bruit.  Cardiovascular:     Rate and Rhythm: Normal rate and regular rhythm.     Pulses: Normal pulses.     Heart sounds: Normal heart sounds.  Pulmonary:     Effort: Pulmonary effort is normal.     Breath sounds: Normal breath  sounds.  Abdominal:     Tenderness: There is no abdominal tenderness.  Musculoskeletal:     Comments: Very flat feet. Calluses. Edema of rt ankle  Skin:    Findings: Rash (intertrigo pannus) present.  Neurological:     Mental Status: She is alert and oriented to person, place, and time. Mental status is at baseline.  Psychiatric:        Mood and Affect: Mood normal.        Behavior:  Behavior normal.    Diabetic Foot Exam - Simple   Simple Foot Form Diabetic Foot exam was performed with the following findings: Yes 11/10/2020  9:09 AM  Visual Inspection See comments: Yes Sensation Testing Intact to touch and monofilament testing bilaterally: Yes Pulse Check Posterior Tibialis and Dorsalis pulse intact bilaterally: Yes Comments Pt has very flat feet. Inversion of rt ankle.      Lab Results  Component Value Date   WBC 6.8 11/10/2020   HGB 14.1 11/10/2020   HCT 42.4 11/10/2020   PLT 249 11/10/2020   GLUCOSE 95 11/10/2020   CHOL 177 11/10/2020   TRIG 147 11/10/2020   HDL 56 11/10/2020   LDLCALC 95 11/10/2020   ALT 14 11/10/2020   AST 21 11/10/2020   NA 141 11/10/2020   K 4.9 11/10/2020   CL 102 11/10/2020   CREATININE 1.11 (H) 11/10/2020   BUN 15 11/10/2020   CO2 23 11/10/2020   HGBA1C 5.9 (H) 11/10/2020      Assessment & Plan:   Problem List Items Addressed This Visit       Respiratory   OSA (obstructive sleep apnea)    Reorder home sleep test.       Relevant Orders   Ambulatory Referral for DME   Home sleep test     Digestive   GERD (gastroesophageal reflux disease)    The current medical regimen is effective;  continue present plan and medications. Continue pepcid 40 mg once daily and prevacid 30 mg once daily.      Relevant Medications   famotidine (PEPCID) 40 MG tablet     Endocrine   RESOLVED: Combined hyperlipidemia associated with type 2 diabetes mellitus (HCC) - Primary     Other   Prediabetes    Recommend continue to work on eating  healthy diet and exercise.        Class 1 obesity due to excess calories with serious comorbidity and body mass index (BMI) of 34.0 to 34.9 in adult    Recommend continue to work on eating healthy diet and exercise.       RLS (restless legs syndrome)    Continue mirapex 0.5 mg one qhs.       Mild recurrent major depression (HCC)    The current medical regimen is effective;  continue present plan and medications. Continue wellbutrin SR 150 mg one twice a day and citalopram 40 mg once daily.       Chronic pain of right ankle    Pt to call back if wishes to go see Dr. Victorino Dike.       Other Visit Diagnoses     Encounter for immunization       Relevant Orders   Moderna Covid-19 Vaccine Bivalent Booster (Completed)   Need for immunization against influenza       Relevant Orders   Flu Vaccine QUAD High Dose(Fluad) (Completed)     .  No orders of the defined types were placed in this encounter.   Orders Placed This Encounter  Procedures   Moderna Covid-19 Vaccine Bivalent Booster   Flu Vaccine QUAD High Dose(Fluad)   CBC with Differential   Comprehensive metabolic panel   Lipid Panel   Hemoglobin A1c   Cardiovascular Risk Assessment   Ambulatory Referral for DME   Home sleep test      Follow-up: Return in about 3 months (around 02/10/2021) for fasting.  An After Visit Summary was printed and given to the patient.  Blane Ohara,  MD [place] [phone]

## 2020-11-10 ENCOUNTER — Encounter: Payer: Self-pay | Admitting: Family Medicine

## 2020-11-10 ENCOUNTER — Other Ambulatory Visit: Payer: Self-pay

## 2020-11-10 ENCOUNTER — Ambulatory Visit (INDEPENDENT_AMBULATORY_CARE_PROVIDER_SITE_OTHER): Payer: Medicare Other | Admitting: Family Medicine

## 2020-11-10 VITALS — BP 108/68 | HR 67 | Temp 97.3°F | Resp 18 | Ht 65.0 in | Wt 185.0 lb

## 2020-11-10 DIAGNOSIS — E1169 Type 2 diabetes mellitus with other specified complication: Secondary | ICD-10-CM | POA: Insufficient documentation

## 2020-11-10 DIAGNOSIS — Z23 Encounter for immunization: Secondary | ICD-10-CM

## 2020-11-10 DIAGNOSIS — I10 Essential (primary) hypertension: Secondary | ICD-10-CM | POA: Insufficient documentation

## 2020-11-10 DIAGNOSIS — E1159 Type 2 diabetes mellitus with other circulatory complications: Secondary | ICD-10-CM | POA: Insufficient documentation

## 2020-11-10 DIAGNOSIS — Z6834 Body mass index (BMI) 34.0-34.9, adult: Secondary | ICD-10-CM

## 2020-11-10 DIAGNOSIS — G2581 Restless legs syndrome: Secondary | ICD-10-CM

## 2020-11-10 DIAGNOSIS — G4733 Obstructive sleep apnea (adult) (pediatric): Secondary | ICD-10-CM | POA: Diagnosis not present

## 2020-11-10 DIAGNOSIS — E782 Mixed hyperlipidemia: Secondary | ICD-10-CM | POA: Diagnosis not present

## 2020-11-10 DIAGNOSIS — K219 Gastro-esophageal reflux disease without esophagitis: Secondary | ICD-10-CM | POA: Diagnosis not present

## 2020-11-10 DIAGNOSIS — M25571 Pain in right ankle and joints of right foot: Secondary | ICD-10-CM

## 2020-11-10 DIAGNOSIS — R7303 Prediabetes: Secondary | ICD-10-CM

## 2020-11-10 DIAGNOSIS — E6609 Other obesity due to excess calories: Secondary | ICD-10-CM

## 2020-11-10 DIAGNOSIS — G8929 Other chronic pain: Secondary | ICD-10-CM

## 2020-11-10 DIAGNOSIS — F330 Major depressive disorder, recurrent, mild: Secondary | ICD-10-CM | POA: Insufficient documentation

## 2020-11-10 NOTE — Patient Instructions (Signed)
Ordering home sleep study with [facility].  Call back if would like to see orthopedics (Dr. Victorino Dike is who I recommend.) Recommend continue to work on eating healthy diet ! Great job with weight loss!

## 2020-11-11 LAB — CBC WITH DIFFERENTIAL/PLATELET
Basophils Absolute: 0.1 10*3/uL (ref 0.0–0.2)
Basos: 1 %
EOS (ABSOLUTE): 0.2 10*3/uL (ref 0.0–0.4)
Eos: 2 %
Hematocrit: 42.4 % (ref 34.0–46.6)
Hemoglobin: 14.1 g/dL (ref 11.1–15.9)
Immature Grans (Abs): 0 10*3/uL (ref 0.0–0.1)
Immature Granulocytes: 0 %
Lymphocytes Absolute: 1.8 10*3/uL (ref 0.7–3.1)
Lymphs: 27 %
MCH: 31.1 pg (ref 26.6–33.0)
MCHC: 33.3 g/dL (ref 31.5–35.7)
MCV: 93 fL (ref 79–97)
Monocytes Absolute: 0.7 10*3/uL (ref 0.1–0.9)
Monocytes: 10 %
Neutrophils Absolute: 4.1 10*3/uL (ref 1.4–7.0)
Neutrophils: 60 %
Platelets: 249 10*3/uL (ref 150–450)
RBC: 4.54 x10E6/uL (ref 3.77–5.28)
RDW: 13.1 % (ref 11.7–15.4)
WBC: 6.8 10*3/uL (ref 3.4–10.8)

## 2020-11-11 LAB — COMPREHENSIVE METABOLIC PANEL
ALT: 14 IU/L (ref 0–32)
AST: 21 IU/L (ref 0–40)
Albumin/Globulin Ratio: 1.7 (ref 1.2–2.2)
Albumin: 4.4 g/dL (ref 3.7–4.7)
Alkaline Phosphatase: 69 IU/L (ref 44–121)
BUN/Creatinine Ratio: 14 (ref 12–28)
BUN: 15 mg/dL (ref 8–27)
Bilirubin Total: 0.5 mg/dL (ref 0.0–1.2)
CO2: 23 mmol/L (ref 20–29)
Calcium: 9.7 mg/dL (ref 8.7–10.3)
Chloride: 102 mmol/L (ref 96–106)
Creatinine, Ser: 1.11 mg/dL — ABNORMAL HIGH (ref 0.57–1.00)
Globulin, Total: 2.6 g/dL (ref 1.5–4.5)
Glucose: 95 mg/dL (ref 70–99)
Potassium: 4.9 mmol/L (ref 3.5–5.2)
Sodium: 141 mmol/L (ref 134–144)
Total Protein: 7 g/dL (ref 6.0–8.5)
eGFR: 52 mL/min/{1.73_m2} — ABNORMAL LOW (ref >59)

## 2020-11-11 LAB — LIPID PANEL
Chol/HDL Ratio: 3.2 ratio (ref 0.0–4.4)
Cholesterol, Total: 177 mg/dL (ref 100–199)
HDL: 56 mg/dL (ref >39)
LDL Chol Calc (NIH): 95 mg/dL (ref 0–99)
Triglycerides: 147 mg/dL (ref 0–149)
VLDL Cholesterol Cal: 26 mg/dL (ref 5–40)

## 2020-11-11 LAB — HEMOGLOBIN A1C
Est. average glucose Bld gHb Est-mCnc: 123 mg/dL
Hgb A1c MFr Bld: 5.9 % — ABNORMAL HIGH (ref 4.8–5.6)

## 2020-11-11 NOTE — Progress Notes (Signed)
Blood count normal.  Liver function normal.  Kidney function abnormal,but stable Cholesterol: great HBA1C: great

## 2020-11-16 DIAGNOSIS — G4733 Obstructive sleep apnea (adult) (pediatric): Secondary | ICD-10-CM | POA: Diagnosis not present

## 2020-11-16 DIAGNOSIS — R0602 Shortness of breath: Secondary | ICD-10-CM | POA: Diagnosis not present

## 2020-11-17 DIAGNOSIS — G4733 Obstructive sleep apnea (adult) (pediatric): Secondary | ICD-10-CM | POA: Diagnosis not present

## 2020-11-17 DIAGNOSIS — R0602 Shortness of breath: Secondary | ICD-10-CM | POA: Diagnosis not present

## 2020-11-25 ENCOUNTER — Other Ambulatory Visit: Payer: Self-pay | Admitting: Physician Assistant

## 2020-11-25 DIAGNOSIS — F330 Major depressive disorder, recurrent, mild: Secondary | ICD-10-CM

## 2020-11-27 DIAGNOSIS — G8929 Other chronic pain: Secondary | ICD-10-CM | POA: Insufficient documentation

## 2020-11-27 NOTE — Assessment & Plan Note (Signed)
Continue mirapex 0.5 mg one qhs.

## 2020-11-27 NOTE — Assessment & Plan Note (Signed)
The current medical regimen is effective;  continue present plan and medications. Continue pepcid 40 mg once daily and prevacid 30 mg once daily.

## 2020-11-27 NOTE — Assessment & Plan Note (Signed)
The current medical regimen is effective;  continue present plan and medications. Continue wellbutrin SR 150 mg one twice a day and citalopram 40 mg once daily.

## 2020-11-27 NOTE — Assessment & Plan Note (Signed)
Pt to call back if wishes to go see Dr. Victorino Dike.

## 2020-11-27 NOTE — Assessment & Plan Note (Signed)
Recommend continue to work on eating healthy diet and exercise.  

## 2020-11-27 NOTE — Assessment & Plan Note (Signed)
Reorder home sleep test.

## 2020-12-06 ENCOUNTER — Other Ambulatory Visit: Payer: Self-pay | Admitting: Family Medicine

## 2020-12-06 ENCOUNTER — Other Ambulatory Visit: Payer: Self-pay | Admitting: Legal Medicine

## 2020-12-06 DIAGNOSIS — F330 Major depressive disorder, recurrent, mild: Secondary | ICD-10-CM

## 2020-12-15 DIAGNOSIS — G4733 Obstructive sleep apnea (adult) (pediatric): Secondary | ICD-10-CM | POA: Diagnosis not present

## 2021-01-05 ENCOUNTER — Telehealth: Payer: Self-pay

## 2021-01-05 NOTE — Progress Notes (Signed)
01/05/21- Called and lvm reminding pt of telephone appt with CPP tomorrow.   Roxana Hires, CMA Clinical Pharmacist Assistant  [phone]

## 2021-01-06 ENCOUNTER — Other Ambulatory Visit: Payer: Self-pay

## 2021-01-06 ENCOUNTER — Ambulatory Visit (INDEPENDENT_AMBULATORY_CARE_PROVIDER_SITE_OTHER): Payer: Medicare Other

## 2021-01-06 DIAGNOSIS — R7303 Prediabetes: Secondary | ICD-10-CM

## 2021-01-06 DIAGNOSIS — G2581 Restless legs syndrome: Secondary | ICD-10-CM

## 2021-01-06 DIAGNOSIS — F330 Major depressive disorder, recurrent, mild: Secondary | ICD-10-CM

## 2021-01-06 DIAGNOSIS — Z6834 Body mass index (BMI) 34.0-34.9, adult: Secondary | ICD-10-CM

## 2021-01-06 DIAGNOSIS — E782 Mixed hyperlipidemia: Secondary | ICD-10-CM

## 2021-01-06 DIAGNOSIS — E6609 Other obesity due to excess calories: Secondary | ICD-10-CM

## 2021-01-06 NOTE — Patient Instructions (Signed)
Visit Information   Goals Addressed   None    Patient Care Plan: CCM Pharmacy Care Plan     Problem Identified: dm, hld, gerd, depression   Priority: High  Onset Date: 07/11/2020     Long-Range Goal: Disease State Management   Start Date: 07/11/2020  Expected End Date: 07/11/2021  Recent Progress: On track  Priority: High  Note:     Current Barriers:  Unable to maintain control of cholesterol  Pharmacist Clinical Goal(s):  Patient will achieve control of cholesterol  as evidenced by lipid panel through collaboration with PharmD and provider.   Interventions: 1:1 collaboration with Cox, Elnita Maxwell, MD regarding development and update of comprehensive plan of care as evidenced by provider attestation and co-signature Inter-disciplinary care team collaboration (see longitudinal plan of care) Comprehensive medication review performed; medication list updated in electronic medical record  Hyperlipidemia: (LDL goal < 100) The 10-year ASCVD risk score (Arnett DK, et al., 2019) is: 21.4%   Values used to calculate the score:     Age: 75 years     Sex: Female     Is Non-Hispanic African American: No     Diabetic: Yes     Tobacco smoker: No     Systolic Blood Pressure: 845 mmHg     Is BP treated: No     HDL Cholesterol: 56 mg/dL     Total Cholesterol: 177 mg/dL Lab Results  Component Value Date   CHOL 177 11/10/2020   CHOL 157 08/03/2020   CHOL 182 04/29/2020   Lab Results  Component Value Date   HDL 56 11/10/2020   HDL 54 08/03/2020   HDL 56 04/29/2020   Lab Results  Component Value Date   LDLCALC 95 11/10/2020   Meyer 80 08/03/2020   LDLCALC 101 (H) 04/29/2020   Lab Results  Component Value Date   TRIG 147 11/10/2020   TRIG 130 08/03/2020   TRIG 141 04/29/2020   Lab Results  Component Value Date   CHOLHDL 3.2 11/10/2020   CHOLHDL 2.9 08/03/2020   CHOLHDL 3.3 04/29/2020  No results found for: LDLDIRECT -Controlled -Current treatment: simvastatin 40 mg  daily  -Medications previously tried: none reported  -Current dietary patterns: doing Noom currently and weight loss -Current exercise habits: walking more since new puppy and daughter are home -Educated on Cholesterol goals;  Benefits of statin for ASCVD risk reduction; Importance of limiting foods high in cholesterol; Exercise goal of 150 minutes per week; -Counseled on diet and exercise extensively Recommended to continue current medication Educated on benefits of healthy diet and exercise.  Prediabetes (A1c goal <6.5%) Lab Results  Component Value Date   HGBA1C 5.9 (H) 11/10/2020   HGBA1C 6.0 (H) 08/03/2020   HGBA1C 6.5 (H) 04/29/2020   Lab Results  Component Value Date   LDLCALC 95 11/10/2020   CREATININE 1.11 (H) 11/10/2020   Lab Results  Component Value Date   NA 141 11/10/2020   K 4.9 11/10/2020   CREATININE 1.11 (H) 11/10/2020   EGFR 52 (L) 11/10/2020   GFRNONAA 54 (L) 01/22/2020   GLUCOSE 95 11/10/2020   Lab Results  Component Value Date   WBC 6.8 11/10/2020   HGB 14.1 11/10/2020   HCT 42.4 11/10/2020   MCV 93 11/10/2020   PLT 249 11/10/2020  -Controlled -Current medications: diet and lifestyle -  -Medications previously tried: none reported  -Current home glucose readings: not checking -Denies hypoglycemic/hyperglycemic symptoms -Current meal patterns: Noom for weight loss support  Patient reports weight  loss and improved food choices since resuming Noom. Updated lab results scheduled for end of June.  -Current exercise: Some exercise - walking with new puppy..  -Educated on A1c and blood sugar goals; Exercise goal of 150 minutes per week; Benefits of weight loss; Carbohydrate counting and/or plate method -Counseled to check feet daily and get yearly eye exams -Counseled on diet and exercise extensively December 2022: Having trouble exercising due to rolling her ankle but she is confident she'll be back soon  Depression/Anxiety (Goal: manage  depression) -Controlled -Current treatment: Bupropion sr 150 mg 12 hour tablet bid  Citalopram 40 mg daily am  -Medications previously tried/failed: patient states she has tried and failed many alternatives in the past and doing really well with current treatment. She doesn't want to make any changes.  -PHQ9:  Depression screen [facility] 2/9 11/10/2020 08/18/2020 08/03/2020  Decreased Interest 0 0 0  Down, Depressed, Hopeless 0 0 0  PHQ - 2 Score 0 0 0  Altered sleeping - 0 -  Tired, decreased energy - 0 -  Change in appetite - 0 -  Feeling bad or failure about yourself  - 0 -  Trouble concentrating - 0 -  Moving slowly or fidgety/restless - 0 -  Suicidal thoughts - 0 -  PHQ-9 Score - 0 -  Difficult doing work/chores - Not difficult at all -  -Educated on Benefits of medication for symptom control -Assessed symptoms management. Patient reports that she has tried and failed many things in the past and this regimen is working well for her and makes her feel normal.. Patient;s current regimen is working well for her. The recommended max dose is 20 mg/daily >2 years of age.   Restless Leg Syndrome  (Goal: manage symptoms of restless leg syndrome) -Controlled -Current treatment  Pramipexole 0.5 mg daily -Medications previously tried: none reported  Recommended to continue therapy  Insomnia (Goal: 7-8 hours of sleep/night) -Controlled -Patient currently getting 7 hours of sleep/night -Patient currently waking up 1-2 times/night -Current treatment: Trazodone CPAP Pramipexole 0.93m -Medications previously tried: 1/2 tablet Trazodone -Counseled on sleep hygiene techniques (consistent sleep/wake up schedule, no electronic screens 1-2 hours before bed, etc) -Recommended to continue current medication    Patient Goals/Self-Care Activities Patient will:  - take medications as prescribed target a minimum of 150 minutes of moderate intensity exercise weekly engage in dietary modifications by  continuing to make healthier choices with Noom.   Follow Up Plan: Telephone follow up appointment with care management team member scheduled for: 07/2021      The patient verbalized understanding of instructions, educational materials, and care plan provided today and declined offer to receive copy of patient instructions, educational materials, and care plan.  The pharmacy team will reach out to the patient again over the next 90 days.   NLane Hacker RTr[facility]

## 2021-01-06 NOTE — Progress Notes (Signed)
Chronic Care Management Pharmacy Note  01/06/2021 Name:  Connie Marks MRN:  [id number] DOB:  Sep 10, 1945  Summary: Pleasant 75 year old female presents for f/u CCM visit. She is from [place] but moved to [place] about 8 years ago from [place] after she divorced her husband of 34 years. 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 She moved here because her brother is a Brewing technologist out here. She has 5 siblings, brother who moved to [place] is middle. Eldest brother passed away (Lived in [place]). Youngest two are twins (1 [place] and 1 [place]). The family tries to get together 1/year and rent a beach house and hang out. She has 2 daughters. 1 in [place] is graduating this year and the other in [place] is a nanny but is thinking about going to school locally there. She has a 51 year old rescue dog that is half beagle, 17% lab, and the rest is a Scientist, research (medical).  Recommendations/Changes made from today's visit: None  Subjective: Connie Marks is an 75 y.o. year old female who is a primary patient of Cox, Kirsten, MD.  The CCM team was consulted for assistance with disease management and care coordination needs.    Engaged with patient by telephone for follow up visit in response to provider referral for pharmacy case management and/or care coordination services.   Consent to Services:  The patient was given information about Chronic Care Management services, agreed to services, and gave verbal consent prior to initiation of services.  Please see initial visit note for detailed documentation.   Patient Care Team: Rochel Brome, MD as PCP - General (Family Medicine) Burnice Logan, [facility] (Inactive) as Pharmacist (Pharmacist)  Recent office visits:  None   Recent consult visits:  None   Hospital visits:  None in previous 6 months   Objective:  Lab Results  Component Value Date    CREATININE 1.11 (H) 11/10/2020   BUN 15 11/10/2020   GFRNONAA 54 (L) 01/22/2020   GFRAA 62 01/22/2020   NA 141 11/10/2020   K 4.9 11/10/2020   CALCIUM 9.7 11/10/2020   CO2 23 11/10/2020   GLUCOSE 95 11/10/2020    Lab Results  Component Value Date/Time   HGBA1C 5.9 (H) 11/10/2020 09:08 AM   HGBA1C 6.0 (H) 08/03/2020 10:30 AM    Last diabetic Eye exam: No results found for: HMDIABEYEEXA  Last diabetic Foot exam: No results found for: HMDIABFOOTEX   Lab Results  Component Value Date   CHOL 177 11/10/2020   HDL 56 11/10/2020   LDLCALC 95 11/10/2020   TRIG 147 11/10/2020   CHOLHDL 3.2 11/10/2020    Hepatic Function Latest Ref Rng & Units 11/10/2020 08/25/2020 08/18/2020  Total Protein 6.0 - 8.5 g/dL 7.0 6.7 7.1  Albumin 3.7 - 4.7 g/dL 4.4 4.5 4.6  AST 0 - 40 IU/L _0 ALT 0 - 32 IU/L _1 Alk Phosphatase 44 - 121 IU/L 69 60 79  Total Bilirubin 0.0 - 1.2 mg/dL 0.5 0.5 0.3    No results found for: TSH, FREET4  CBC Latest Ref Rng & Units 11/10/2020 08/03/2020 04/29/2020  WBC 3.4 - 10.8 x10E3/uL 6.8 6.8 9.1  Hemoglobin 11.1 - 15.9 g/dL 14.1 13.0 13.1  Hematocrit 34.0 - 46.6 % 42.4 39.8 40.2  Platelets 150 - 450 x10E3/uL 249  179 258    No results found for: VD25OH  Clinical ASCVD: No  The 10-year ASCVD risk score (Arnett DK, et al., 2019) is: 21.4%   Values used to calculate the score:     Age: 24 years     Sex: Female     Is Non-Hispanic African American: No     Diabetic: Yes     Tobacco smoker: No     Systolic Blood Pressure: 099 mmHg     Is BP treated: No     HDL Cholesterol: 56 mg/dL     Total Cholesterol: 177 mg/dL    Depression screen [facility] 2/9 11/10/2020 08/18/2020 08/03/2020  Decreased Interest 0 0 0  Down, Depressed, Hopeless 0 0 0  PHQ - 2 Score 0 0 0  Altered sleeping - 0 -  Tired, decreased energy - 0 -  Change in appetite - 0 -  Feeling bad or failure about yourself  - 0 -  Trouble concentrating - 0 -  Moving slowly or fidgety/restless - 0 -   Suicidal thoughts - 0 -  PHQ-9 Score - 0 -  Difficult doing work/chores - Not difficult at all -     Other: (CHADS2VASc if Afib, MMRC or CAT for COPD, ACT, DEXA)  Social History   Tobacco Use  Smoking Status Former   Types: Cigarettes   Quit date: 12/18/1985   Years since quitting: 35.0  Smokeless Tobacco Never   BP Readings from Last 3 Encounters:  11/10/20 108/68  08/18/20 104/66  08/03/20 110/70   Pulse Readings from Last 3 Encounters:  11/10/20 67  08/18/20 68  08/03/20 76   Wt Readings from Last 3 Encounters:  11/10/20 185 lb (83.9 kg)  08/18/20 197 lb (89.4 kg)  08/03/20 193 lb 9.6 oz (87.8 kg)   BMI Readings from Last 3 Encounters:  11/10/20 30.79 kg/m  08/18/20 32.78 kg/m  08/03/20 32.22 kg/m    Assessment/Interventions: Review of patient past medical history, allergies, medications, health status, including review of consultants reports, laboratory and other test data, was performed as part of comprehensive evaluation and provision of chronic care management services.   SDOH:  (Social Determinants of Health) assessments and interventions performed: Yes SDOH Interventions    Flowsheet Row Most Recent Value  SDOH Interventions   Financial Strain Interventions Intervention Not Indicated  Transportation Interventions Intervention Not Indicated      SDOH Screenings   Alcohol Screen: Low Risk    Last Alcohol Screening Score (AUDIT): 3  Depression (PHQ2-9): Low Risk    PHQ-2 Score: 0  Financial Resource Strain: Low Risk    Difficulty of Paying Living Expenses: Not hard at all  Food Insecurity: No Food Insecurity   Worried About Charity fundraiser in the Last Year: Never true   Ran Out of Food in the Last Year: Never true  Housing: Low Risk    Last Housing Risk Score: 0  Physical Activity: Not on file  Social Connections: Not on file  Stress: Not on file  Tobacco Use: Medium Risk   Smoking Tobacco Use: Former   Smokeless Tobacco Use: Never    Passive Exposure: Not on file  Transportation Needs: No Transportation Needs   Lack of Transportation (Medical): No   Lack of Transportation (Non-Medical): No    CCM Care Plan  Allergies  Allergen Reactions   Adhesive [Tape]     Red whelps     Medications Reviewed Today     Reviewed by Rochel Brome,  MD (Physician) on 11/10/20 at [facility] List Status: <None>   Medication Order Taking? Sig Documenting Provider Last Dose Status Informant  aspirin EC 81 MG tablet [id number]  Take 81 mg by mouth daily. Swallow whole. [provider]  Active   buPROPion [facility] SR) 150 MG 12 hr tablet [id number]  TAKE ONE TABLET BY MOUTH TWICE DAILY Cox, Kirsten, MD  Active   Calcium Carbonate+Vitamin D 600-200 MG-UNIT TABS [id number]  Take 1 tablet by mouth daily. [provider]  Active   citalopram (CELEXA) 40 MG tablet [id number]  Take 1 tablet (40 mg total) by mouth in the morning. Marge Duncans, PA-C  Active   famotidine (PEPCID) 40 MG tablet [id number]  Take 40 mg by mouth at bedtime. [provider]  Active   lansoprazole (PREVACID) 30 MG capsule [id number]  Take 1 capsule (30 mg total) by mouth daily at 12 noon. Cox, Kirsten, MD  Active   Multiple Vitamins-Minerals (MULTIVITAL PO) [id number]  Take by mouth. [provider]  Active   Polyethyl Glycol-Propyl Glycol (SYSTANE) 0.4-0.3 % SOLN [id number]  Apply 1 drop to eye daily as needed. [provider]  Active   pramipexole (MIRAPEX) 0.5 MG tablet [id number]  TAKE ONE TABLET BY MOUTH THREE TIMES DAILY Lillard Anes, MD  Active   simvastatin (ZOCOR) 40 MG tablet [id number]  TAKE ONE TABLET BY MOUTH DAILY Cox, Kirsten, MD  Active   traZODone (DESYREL) 50 MG tablet [id number]  TAKE 1/2 TO 1 TABLET BY MOUTH AT BEDTIME AS NEEDED FOR SLEEP Cox, Kirsten, MD  Active   valACYclovir (VALTREX) 1000 MG tablet [id number]  Take 2 tablets (2,000 mg total) by mouth 2 (two) times daily. 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   Medicare annual wellness visit, subsequent 01/20/2020   Prediabetes 07/17/2019   Class 1 obesity due to excess calories with serious comorbidity and body mass index (BMI) of 34.0 to 34.9 in adult 07/17/2019   RLS (restless legs syndrome) 07/17/2019   Hyperlipidemia    Depression    GERD (gastroesophageal reflux disease)     Immunization History  Administered Date(s) Administered   Fluad Quad(high Dose 65+) 10/21/2019, 11/10/2020   Influenza, High Dose Seasonal PF 10/27/2018   Influenza-Unspecified 10/09/2018   Moderna Covid-19 Vaccine Bivalent Booster 42yr & up 11/10/2020   Moderna SARS-COV2 Booster Vaccination 12/08/2019, 06/21/2020   Moderna Sars-Covid-2 Vaccination 02/25/2019, 03/23/2019   Pneumococcal Conjugate-13 12/21/2013   Pneumococcal Polysaccharide-23 08/29/2010   Tdap 03/11/2018   Zoster, Live 08/28/2012    Conditions to be addressed/monitored:  Hyperlipidemia, GERD, Depression and prediabetes  Care Plan : [place] Updates made by KLane Hacker RPH since 01/06/2021 12:00 AM     Problem: dm, hld, gerd, depression   Priority: High  Onset Date: 07/11/2020     Long-Range Goal: Disease State Management   Start Date: 07/11/2020  Expected End Date: 07/11/2021  Recent Progress: On track  Priority: High  Note:     Current Barriers:  Unable to maintain control of cholesterol  Pharmacist Clinical Goal(s):  Patient will achieve control of cholesterol  as evidenced by lipid panel through collaboration with PharmD and provider.   Interventions: 1:1 collaboration with  Cox, Kirsten, MD regarding development and update of comprehensive plan of care as evidenced by provider attestation and co-signature Inter-disciplinary care team  collaboration (see longitudinal plan of care) Comprehensive medication review performed; medication list updated in electronic medical record  Hyperlipidemia: (LDL goal < 100) The 10-year ASCVD risk score (Arnett DK, et al., 2019) is: 21.4%   Values used to calculate the score:     Age: 58 years     Sex: Female     Is Non-Hispanic African American: No     Diabetic: Yes     Tobacco smoker: No     Systolic Blood Pressure: 546 mmHg     Is BP treated: No     HDL Cholesterol: 56 mg/dL     Total Cholesterol: 177 mg/dL Lab Results  Component Value Date   CHOL 177 11/10/2020   CHOL 157 08/03/2020   CHOL 182 04/29/2020   Lab Results  Component Value Date   HDL 56 11/10/2020   HDL 54 08/03/2020   HDL 56 04/29/2020   Lab Results  Component Value Date   LDLCALC 95 11/10/2020   LDLCALC 80 08/03/2020   LDLCALC 101 (H) 04/29/2020   Lab Results  Component Value Date   TRIG 147 11/10/2020   TRIG 130 08/03/2020   TRIG 141 04/29/2020   Lab Results  Component Value Date   CHOLHDL 3.2 11/10/2020   CHOLHDL 2.9 08/03/2020   CHOLHDL 3.3 04/29/2020  No results found for: LDLDIRECT -Controlled -Current treatment: simvastatin 40 mg daily  -Medications previously tried: none reported  -Current dietary patterns: doing Noom currently and weight loss -Current exercise habits: walking more since new puppy and daughter are home -Educated on Cholesterol goals;  Benefits of statin for ASCVD risk reduction; Importance of limiting foods high in cholesterol; Exercise goal of 150 minutes per week; -Counseled on diet and exercise extensively Recommended to continue current medication Educated on benefits of healthy diet and exercise.  Prediabetes (A1c goal <6.5%) Lab Results  Component Value Date   HGBA1C 5.9 (H) 11/10/2020   HGBA1C 6.0 (H) 08/03/2020   HGBA1C 6.5 (H) 04/29/2020   Lab Results  Component Value Date   LDLCALC 95 11/10/2020   CREATININE 1.11 (H) 11/10/2020   Lab Results   Component Value Date   NA 141 11/10/2020   K 4.9 11/10/2020   CREATININE 1.11 (H) 11/10/2020   EGFR 52 (L) 11/10/2020   GFRNONAA 54 (L) 01/22/2020   GLUCOSE 95 11/10/2020   Lab Results  Component Value Date   WBC 6.8 11/10/2020   HGB 14.1 11/10/2020   HCT 42.4 11/10/2020   MCV 93 11/10/2020   PLT 249 11/10/2020  -Controlled -Current medications: diet and lifestyle -  -Medications previously tried: none reported  -Current home glucose readings: not checking -Denies hypoglycemic/hyperglycemic symptoms -Current meal patterns: Noom for weight loss support  Patient reports weight loss and improved food choices since resuming Noom. Updated lab results scheduled for end of June.  -Current exercise: Some exercise - walking with new puppy..  -Educated on A1c and blood sugar goals; Exercise goal of 150 minutes per week; Benefits of weight loss; Carbohydrate counting and/or plate method -Counseled to check feet daily and get yearly eye exams -Counseled on diet and exercise extensively December 2022: Having trouble exercising due to rolling her ankle but she is confident she'll be back soon  Depression/Anxiety (Goal: manage depression) -Controlled -Current treatment: Bupropion sr 150 mg 12 hour tablet bid  Citalopram 40 mg daily am  -  Medications previously tried/failed: patient states she has tried and failed many alternatives in the past and doing really well with current treatment. She doesn't want to make any changes.  -PHQ9:  Depression screen [facility] 2/9 11/10/2020 08/18/2020 08/03/2020  Decreased Interest 0 0 0  Down, Depressed, Hopeless 0 0 0  PHQ - 2 Score 0 0 0  Altered sleeping - 0 -  Tired, decreased energy - 0 -  Change in appetite - 0 -  Feeling bad or failure about yourself  - 0 -  Trouble concentrating - 0 -  Moving slowly or fidgety/restless - 0 -  Suicidal thoughts - 0 -  PHQ-9 Score - 0 -  Difficult doing work/chores - Not difficult at all -  -Educated on Benefits  of medication for symptom control -Assessed symptoms management. Patient reports that she has tried and failed many things in the past and this regimen is working well for her and makes her feel normal.. Patient;s current regimen is working well for her. The recommended max dose is 20 mg/daily >29 years of age.   Restless Leg Syndrome  (Goal: manage symptoms of restless leg syndrome) -Controlled -Current treatment  Pramipexole 0.5 mg daily -Medications previously tried: none reported  Recommended to continue therapy  Insomnia (Goal: 7-8 hours of sleep/night) -Controlled -Patient currently getting 7 hours of sleep/night -Patient currently waking up 1-2 times/night -Current treatment: Trazodone CPAP Pramipexole 0.69m -Medications previously tried: 1/2 tablet Trazodone -Counseled on sleep hygiene techniques (consistent sleep/wake up schedule, no electronic screens 1-2 hours before bed, etc) -Recommended to continue current medication    Patient Goals/Self-Care Activities Patient will:  - take medications as prescribed target a minimum of 150 minutes of moderate intensity exercise weekly engage in dietary modifications by continuing to make healthier choices with Noom.   Follow Up Plan: Telephone follow up appointment with care management team member scheduled for: 07/2021      Medication Assistance: None required.  Patient affirms current coverage meets needs.  Compliance/Adherence/Medication fill history: [facility]: Simvastatin 40 mg- Last filled 11-03-2020 90 DS   Care Gaps: Last annual wellness visit? 01-20-2020 Yearly ophthalmology overdue Hep C screening overdue Shingrix overdue    [place] [address]Phone: [phone]: [phone] Uses pill box? No  Pt endorses good compliance  We discussed: Benefits of medication synchronization,  packaging and delivery as well as enhanced pharmacist oversight with [employer]. Patient decided to: Continue current medication management strategy  Care Plan and Follow Up Patient Decision:  Patient agrees to Care Plan and Follow-up.  Plan: Telephone follow up appointment with care management team member scheduled for:  07/2021

## 2021-01-14 DIAGNOSIS — G4733 Obstructive sleep apnea (adult) (pediatric): Secondary | ICD-10-CM | POA: Diagnosis not present

## 2021-02-03 ENCOUNTER — Other Ambulatory Visit: Payer: Self-pay | Admitting: Family Medicine

## 2021-02-03 DIAGNOSIS — E782 Mixed hyperlipidemia: Secondary | ICD-10-CM

## 2021-02-04 DIAGNOSIS — F330 Major depressive disorder, recurrent, mild: Secondary | ICD-10-CM | POA: Diagnosis not present

## 2021-02-04 DIAGNOSIS — E782 Mixed hyperlipidemia: Secondary | ICD-10-CM | POA: Diagnosis not present

## 2021-02-08 DIAGNOSIS — M25571 Pain in right ankle and joints of right foot: Secondary | ICD-10-CM | POA: Diagnosis not present

## 2021-02-08 DIAGNOSIS — M67873 Other specified disorders of tendon, right ankle and foot: Secondary | ICD-10-CM | POA: Diagnosis not present

## 2021-02-08 DIAGNOSIS — M19079 Primary osteoarthritis, unspecified ankle and foot: Secondary | ICD-10-CM | POA: Insufficient documentation

## 2021-02-08 DIAGNOSIS — M19071 Primary osteoarthritis, right ankle and foot: Secondary | ICD-10-CM | POA: Diagnosis not present

## 2021-02-14 DIAGNOSIS — G4733 Obstructive sleep apnea (adult) (pediatric): Secondary | ICD-10-CM | POA: Diagnosis not present

## 2021-02-16 ENCOUNTER — Other Ambulatory Visit: Payer: Self-pay

## 2021-02-16 ENCOUNTER — Encounter: Payer: Self-pay | Admitting: Family Medicine

## 2021-02-16 ENCOUNTER — Ambulatory Visit (INDEPENDENT_AMBULATORY_CARE_PROVIDER_SITE_OTHER): Payer: Medicare Other | Admitting: Family Medicine

## 2021-02-16 VITALS — BP 100/64 | HR 78 | Temp 98.3°F | Resp 16 | Ht 65.0 in | Wt 190.0 lb

## 2021-02-16 DIAGNOSIS — E6609 Other obesity due to excess calories: Secondary | ICD-10-CM

## 2021-02-16 DIAGNOSIS — F330 Major depressive disorder, recurrent, mild: Secondary | ICD-10-CM

## 2021-02-16 DIAGNOSIS — G4733 Obstructive sleep apnea (adult) (pediatric): Secondary | ICD-10-CM | POA: Diagnosis not present

## 2021-02-16 DIAGNOSIS — R7303 Prediabetes: Secondary | ICD-10-CM | POA: Diagnosis not present

## 2021-02-16 DIAGNOSIS — K219 Gastro-esophageal reflux disease without esophagitis: Secondary | ICD-10-CM | POA: Diagnosis not present

## 2021-02-16 DIAGNOSIS — E782 Mixed hyperlipidemia: Secondary | ICD-10-CM

## 2021-02-16 DIAGNOSIS — Z6831 Body mass index (BMI) 31.0-31.9, adult: Secondary | ICD-10-CM

## 2021-02-16 NOTE — Assessment & Plan Note (Signed)
The current medical regimen is effective;  continue present plan and medications.  

## 2021-02-16 NOTE — Assessment & Plan Note (Signed)
Well controlled.  ?No changes to medicines.  ?Continue to work on eating a healthy diet and exercise.  ?Labs drawn today.  ?

## 2021-02-16 NOTE — Progress Notes (Signed)
Subjective:  Patient ID: Connie Marks, female    DOB: 03/11/1945  Age: 76 y.o. MRN: [id number]  Chief Complaint  Patient presents with   Prediabetes   Hyperlipidemia   Gastroesophageal Reflux    HPI Prediabetes: Patient is eating low carbs diet. She does not take any medication for prediabetes. She is not able to exercise due to pain on her right foot and ankle. She does not check her blood sugar. Hyperlipidemia: She is currently taking simvastatin 40 mg daily. GERD: Takes Lansoprazole 30 mg  daily, Famotidine 40 mg at bedtime. Depression: Patient is taking Wellbutrin 150 mg BID, Celexa 40 mg daily. OSA: on cpap. Doing well    Current Outpatient Medications on File Prior to Visit  Medication Sig Dispense Refill   aspirin EC 81 MG tablet Take 81 mg by mouth daily. Swallow whole.     buPROPion (WELLBUTRIN SR) 150 MG 12 hr tablet TAKE ONE TABLET BY MOUTH TWICE DAILY 180 tablet 1   Calcium Carbonate+Vitamin D 600-200 MG-UNIT TABS Take 1 tablet by mouth daily.     citalopram (CELEXA) 40 MG tablet Take 1 tablet (40 mg total) by mouth in the morning. 90 tablet 1   famotidine (PEPCID) 40 MG tablet Take 40 mg by mouth at bedtime.     ibuprofen (ADVIL) 600 MG tablet ibuprofen 600 mg tablet  TAKE ONE TABLET BY MOUTH EVERY 8 HOURS AS NEEDED     lansoprazole (PREVACID) 30 MG capsule Take 1 capsule (30 mg total) by mouth daily at 12 noon. 90 capsule 3   Multiple Vitamins-Minerals (MULTIVITAL PO) Take by mouth.     Polyethyl Glycol-Propyl Glycol (SYSTANE) 0.4-0.3 % SOLN Apply 1 drop to eye daily as needed.     pramipexole (MIRAPEX) 0.5 MG tablet TAKE ONE TABLET BY MOUTH THREE TIMES DAILY 30 tablet 3   simvastatin (ZOCOR) 40 MG tablet TAKE ONE TABLET BY MOUTH DAILY 90 tablet 0   No current facility-administered medications on file prior to visit.   Past Medical History:  Diagnosis Date   Anxiety    Depression    GERD (gastroesophageal reflux disease)    Hyperlipidemia    Prediabetes     Sleep apnea    uses CPAP every night   Past Surgical History:  Procedure Laterality Date   CESAREAN SECTION     DIAGNOSTIC LAPAROSCOPY     HERNIA REPAIR     JOINT REPLACEMENT     knee replacements bilat   JOINT REPLACEMENT     hip replacements bilat   ORIF ORBITAL FRACTURE Right 12/19/2018   Procedure: OPEN TREATMENT RIGHT ORBITAL FLOOR WITH IMPLANT, PERIORBITAL APPROACH;  Surgeon: Glenna Fellows, MD;  Location: [facility];  Service: Plastics;  Laterality: Right;   ORIF ORBITAL FRACTURE Right 02/03/2019   Procedure: OPEN TREATMENT RIGHT ORBITAL FLOOR FRACTURE REVISION, PERI ORBITAL APPROACH;  Surgeon: Glenna Fellows, MD;  Location: [facility];  Service: Plastics;  Laterality: Right;    History reviewed. No pertinent family history. Social History   Socioeconomic History   Marital status: Divorced    Spouse name: Not on file   Number of children: Not on file   Years of education: Not on file   Highest education level: Not on file  Occupational History   Not on file  Tobacco Use   Smoking status: Former    Packs/day: 1.00    Years: 15.00    Pack years: 15.00    Types: Cigarettes  Quit date: 12/18/1985    Years since quitting: 35.2   Smokeless tobacco: Never  Vaping Use   Vaping Use: Never used  Substance and Sexual Activity   Alcohol use: Yes    Alcohol/week: 3.0 standard drinks    Types: 2 Glasses of wine, 1 Shots of liquor per week    Comment: 2 glasses /week   Drug use: Never   Sexual activity: Not Currently  Other Topics Concern   Not on file  Social History Narrative   Not on file   Social Determinants of Health   Financial Resource Strain: Low Risk    Difficulty of Paying Living Expenses: Not hard at all  Food Insecurity: Not on file  Transportation Needs: No Transportation Needs   Lack of Transportation (Medical): No   Lack of Transportation (Non-Medical): No  Physical Activity: Not on file  Stress: Not on file   Social Connections: Not on file    Review of Systems  Constitutional:  Negative for chills, fatigue and fever.  HENT:  Negative for congestion, ear pain and sore throat.   Respiratory:  Negative for cough and shortness of breath.   Cardiovascular:  Negative for chest pain and palpitations.  Gastrointestinal:  Negative for abdominal pain, constipation, diarrhea, nausea and vomiting.  Endocrine: Negative for polydipsia, polyphagia and polyuria.  Genitourinary:  Negative for difficulty urinating and dysuria.  Musculoskeletal:  Negative for arthralgias (Right Ankle and foot pain), back pain and myalgias.  Skin:  Negative for rash.  Neurological:  Negative for headaches.  Psychiatric/Behavioral:  Negative for dysphoric mood. The patient is not nervous/anxious.     Objective:  BP 100/64    Pulse 78    Temp 98.3 F (36.8 C)    Resp 16    Ht 5\' 5"  (1.651 m)    Wt 190 lb (86.2 kg)    SpO2 97%    BMI 31.62 kg/m   BP/Weight 02/16/2021 11/10/2020 08/18/2020  Systolic BP 100 108 104  Diastolic BP 64 68 66  Wt. (Lbs) 190 185 197  BMI 31.62 30.79 32.78    Physical Exam Vitals reviewed.  Constitutional:      Appearance: Normal appearance. She is normal weight.  Neck:     Vascular: No carotid bruit.  Cardiovascular:     Rate and Rhythm: Normal rate and regular rhythm.     Pulses: Normal pulses.     Heart sounds: Normal heart sounds.  Pulmonary:     Effort: Pulmonary effort is normal. No respiratory distress.     Breath sounds: Normal breath sounds.  Abdominal:     General: Abdomen is flat. Bowel sounds are normal.     Palpations: Abdomen is soft.     Tenderness: There is no abdominal tenderness.  Neurological:     Mental Status: She is alert and oriented to person, place, and time.  Psychiatric:        Mood and Affect: Mood normal.        Behavior: Behavior normal.    Diabetic Foot Exam - Simple   Simple Foot Form Diabetic Foot exam was performed with the following findings: Yes  02/16/2021  8:54 AM  Visual Inspection See comments: Yes Sensation Testing Intact to touch and monofilament testing bilaterally: Yes Pulse Check Posterior Tibialis and Dorsalis pulse intact bilaterally: Yes Comments Claw toes on left foot greater than right foot.  Flat feet. Ankle deformities.  Pt is seeing Dr. 04/16/2021.       Lab Results  Component  Value Date   WBC 7.7 02/16/2021   HGB 12.6 02/16/2021   HCT 37.8 02/16/2021   PLT 232 02/16/2021   GLUCOSE 94 02/16/2021   CHOL 188 02/16/2021   TRIG 89 02/16/2021   HDL 63 02/16/2021   LDLCALC 109 (H) 02/16/2021   ALT 17 02/16/2021   AST 20 02/16/2021   NA 145 (H) 02/16/2021   K 5.0 02/16/2021   CL 106 02/16/2021   CREATININE 1.13 (H) 02/16/2021   BUN 21 02/16/2021   CO2 23 02/16/2021   HGBA1C 5.9 (H) 02/16/2021      Assessment & Plan:   Problem List Items Addressed This Visit       Respiratory   OSA (obstructive sleep apnea) - Primary     Digestive   GERD (gastroesophageal reflux disease)    The current medical regimen is effective;  continue present plan and medications.        Other   Class 1 obesity due to excess calories with serious comorbidity and body mass index (BMI) of 31.0 to 31.9 in adult    Recommend continue to work on eating healthy diet and exercise.       Hyperlipidemia    Well controlled.  No changes to medicines.  Continue to work on eating a healthy diet and exercise.  Labs drawn today.        Relevant Orders   Lipid panel (Completed)   CBC with Differential/Platelet (Completed)   Prediabetes    The current medical regimen is effective; recommend continue to work on eating healthy diet and exercise.       Relevant Orders   Comprehensive metabolic panel (Completed)   Hemoglobin A1c (Completed)   CBC with Differential/Platelet (Completed)   Mild recurrent major depression (HCC)    The current medical regimen is effective;  continue present plan and medications.      . Follow-up: Return in about 6 months (around 08/16/2021) for chronic fasting, awv due any time. Schedule with Selena BattenKim. .  An After Visit Summary was printed and given to the patient.  Blane OharaKirsten Charnika Herbst, MD [facility] [phone]

## 2021-02-16 NOTE — Assessment & Plan Note (Signed)
The current medical regimen is effective; recommend continue to work on eating healthy diet and exercise.

## 2021-02-17 LAB — LIPID PANEL
Chol/HDL Ratio: 3 ratio (ref 0.0–4.4)
Cholesterol, Total: 188 mg/dL (ref 100–199)
HDL: 63 mg/dL (ref >39)
LDL Chol Calc (NIH): 109 mg/dL — ABNORMAL HIGH (ref 0–99)
Triglycerides: 89 mg/dL (ref 0–149)
VLDL Cholesterol Cal: 16 mg/dL (ref 5–40)

## 2021-02-17 LAB — COMPREHENSIVE METABOLIC PANEL
ALT: 17 IU/L (ref 0–32)
AST: 20 IU/L (ref 0–40)
Albumin/Globulin Ratio: 1.9 (ref 1.2–2.2)
Albumin: 4.4 g/dL (ref 3.7–4.7)
Alkaline Phosphatase: 75 IU/L (ref 44–121)
BUN/Creatinine Ratio: 19 (ref 12–28)
BUN: 21 mg/dL (ref 8–27)
Bilirubin Total: 0.5 mg/dL (ref 0.0–1.2)
CO2: 23 mmol/L (ref 20–29)
Calcium: 9.5 mg/dL (ref 8.7–10.3)
Chloride: 106 mmol/L (ref 96–106)
Creatinine, Ser: 1.13 mg/dL — ABNORMAL HIGH (ref 0.57–1.00)
Globulin, Total: 2.3 g/dL (ref 1.5–4.5)
Glucose: 94 mg/dL (ref 70–99)
Potassium: 5 mmol/L (ref 3.5–5.2)
Sodium: 145 mmol/L — ABNORMAL HIGH (ref 134–144)
Total Protein: 6.7 g/dL (ref 6.0–8.5)
eGFR: 51 mL/min/{1.73_m2} — ABNORMAL LOW (ref >59)

## 2021-02-17 LAB — CBC WITH DIFFERENTIAL/PLATELET
Basophils Absolute: 0.1 10*3/uL (ref 0.0–0.2)
Basos: 1 %
EOS (ABSOLUTE): 0.2 10*3/uL (ref 0.0–0.4)
Eos: 2 %
Hematocrit: 37.8 % (ref 34.0–46.6)
Hemoglobin: 12.6 g/dL (ref 11.1–15.9)
Immature Grans (Abs): 0 10*3/uL (ref 0.0–0.1)
Immature Granulocytes: 0 %
Lymphocytes Absolute: 2.2 10*3/uL (ref 0.7–3.1)
Lymphs: 28 %
MCH: 30.4 pg (ref 26.6–33.0)
MCHC: 33.3 g/dL (ref 31.5–35.7)
MCV: 91 fL (ref 79–97)
Monocytes Absolute: 0.9 10*3/uL (ref 0.1–0.9)
Monocytes: 12 %
Neutrophils Absolute: 4.3 10*3/uL (ref 1.4–7.0)
Neutrophils: 57 %
Platelets: 232 10*3/uL (ref 150–450)
RBC: 4.15 x10E6/uL (ref 3.77–5.28)
RDW: 12.9 % (ref 11.7–15.4)
WBC: 7.7 10*3/uL (ref 3.4–10.8)

## 2021-02-17 LAB — HEMOGLOBIN A1C
Est. average glucose Bld gHb Est-mCnc: 123 mg/dL
Hgb A1c MFr Bld: 5.9 % — ABNORMAL HIGH (ref 4.8–5.6)

## 2021-02-21 ENCOUNTER — Other Ambulatory Visit: Payer: Self-pay | Admitting: Family Medicine

## 2021-02-21 DIAGNOSIS — F330 Major depressive disorder, recurrent, mild: Secondary | ICD-10-CM

## 2021-02-23 ENCOUNTER — Other Ambulatory Visit: Payer: Self-pay | Admitting: Family Medicine

## 2021-02-24 ENCOUNTER — Other Ambulatory Visit: Payer: Self-pay | Admitting: Family Medicine

## 2021-02-24 DIAGNOSIS — G4709 Other insomnia: Secondary | ICD-10-CM

## 2021-02-26 ENCOUNTER — Encounter: Payer: Self-pay | Admitting: Family Medicine

## 2021-02-26 DIAGNOSIS — Z6831 Body mass index (BMI) 31.0-31.9, adult: Secondary | ICD-10-CM | POA: Insufficient documentation

## 2021-02-26 DIAGNOSIS — E6609 Other obesity due to excess calories: Secondary | ICD-10-CM | POA: Insufficient documentation

## 2021-02-26 NOTE — Assessment & Plan Note (Signed)
Recommend continue to work on eating healthy diet and exercise.  

## 2021-03-14 ENCOUNTER — Telehealth: Payer: Self-pay

## 2021-03-14 ENCOUNTER — Other Ambulatory Visit: Payer: Self-pay

## 2021-03-14 ENCOUNTER — Other Ambulatory Visit: Payer: Self-pay | Admitting: Family Medicine

## 2021-03-14 ENCOUNTER — Ambulatory Visit (INDEPENDENT_AMBULATORY_CARE_PROVIDER_SITE_OTHER): Payer: Medicare Other

## 2021-03-14 VITALS — BP 123/78 | HR 81 | Resp 16 | Ht 65.0 in | Wt 190.4 lb

## 2021-03-14 DIAGNOSIS — Z0000 Encounter for general adult medical examination without abnormal findings: Secondary | ICD-10-CM

## 2021-03-14 DIAGNOSIS — Z1231 Encounter for screening mammogram for malignant neoplasm of breast: Secondary | ICD-10-CM | POA: Diagnosis not present

## 2021-03-14 MED ORDER — AMOXICILLIN 500 MG PO TABS: 2000.0000 mg | ORAL_TABLET | Freq: Every day | ORAL | 0 refills | Status: DC

## 2021-03-14 NOTE — Progress Notes (Addendum)
Subjective:   Connie Marks is a 76 y.o. female who presents for Medicare Annual (Subsequent) preventive examination.  This wellness visit is conducted by a nurse.  The patient's medications were reviewed and reconciled since the patient's last visit.  History details were provided by the patient.  The history appears to be reliable.    Patient's last AWV was more than one year ago.   Medical History: Patient history and Family history was reviewed  Medications, Allergies, and preventative health maintenance was reviewed and updated.   Review of Systems    Review of Systems  Constitutional: Negative.   HENT: Negative.    Eyes:  Positive for visual disturbance.  Respiratory: Negative.    Cardiovascular: Negative.  Negative for chest pain and palpitations.  Musculoskeletal:  Positive for arthralgias and gait problem.  Neurological:  Negative for dizziness and headaches.  Psychiatric/Behavioral: Negative.           Objective:    Today's Vitals   03/14/21 1009  BP: 123/78  Pulse: 81  Resp: 16  SpO2: 94%  Weight: 190 lb 6.4 oz (86.4 kg)  Height: 5\' 5"  (1.651 m)  PainSc: 2   PainLoc: Foot   Body mass index is 31.68 kg/m.  Advanced Directives 03/14/2021 01/20/2020 02/03/2019 01/26/2019 12/19/2018 12/12/2018  Does Patient Have a Medical Advance Directive? No No No No No No  Would patient like information on creating a medical advance directive? No - Patient declined - No - Patient declined - Yes (MAU/Ambulatory/Procedural Areas - Information given) No - Patient declined    Current Medications (verified) Outpatient Encounter Medications as of 03/14/2021  Medication Sig   aspirin EC 81 MG tablet Take 81 mg by mouth daily. Swallow whole.   buPROPion (WELLBUTRIN SR) 150 MG 12 hr tablet TAKE ONE TABLET BY MOUTH TWICE DAILY   Calcium Carbonate+Vitamin D 600-200 MG-UNIT TABS Take 1 tablet by mouth daily.   citalopram (CELEXA) 40 MG tablet Take 1 tablet (40 mg total) by mouth in  the morning.   famotidine (PEPCID) 40 MG tablet Take 40 mg by mouth at bedtime.   lansoprazole (PREVACID) 30 MG capsule Take 1 capsule (30 mg total) by mouth daily at 12 noon.   Multiple Vitamins-Minerals (MULTIVITAL PO) Take by mouth.   Polyethyl Glycol-Propyl Glycol (SYSTANE) 0.4-0.3 % SOLN Apply 1 drop to eye daily as needed.   pramipexole (MIRAPEX) 0.5 MG tablet TAKE ONE TABLET BY MOUTH THREE TIMES DAILY   simvastatin (ZOCOR) 40 MG tablet TAKE ONE TABLET BY MOUTH DAILY   traZODone (DESYREL) 50 MG tablet TAKE 1/2 TO 1 TABLET BY MOUTH AT BEDTIME AS NEEDED FOR SLEEP   valACYclovir (VALTREX) 1000 MG tablet Take 2 tablets (2,000 mg total) by mouth 2 (two) times daily at onset   [DISCONTINUED] ibuprofen (ADVIL) 600 MG tablet ibuprofen 600 mg tablet  TAKE ONE TABLET BY MOUTH EVERY 8 HOURS AS NEEDED   No facility-administered encounter medications on file as of 03/14/2021.    Allergies (verified) Adhesive [tape]   History: Past Medical History:  Diagnosis Date   Anxiety    Depression    GERD (gastroesophageal reflux disease)    Hyperlipidemia    Prediabetes    Sleep apnea    uses CPAP every night   Past Surgical History:  Procedure Laterality Date   CESAREAN SECTION     DIAGNOSTIC LAPAROSCOPY     HERNIA REPAIR     JOINT REPLACEMENT     knee replacements bilat  JOINT REPLACEMENT     hip replacements bilat   ORIF ORBITAL FRACTURE Right 12/19/2018   Procedure: OPEN TREATMENT RIGHT ORBITAL FLOOR WITH IMPLANT, PERIORBITAL APPROACH;  Surgeon: Irene Limbo, MD;  Location: [facility];  Service: Plastics;  Laterality: Right;   ORIF ORBITAL FRACTURE Right 02/03/2019   Procedure: OPEN TREATMENT RIGHT ORBITAL FLOOR FRACTURE REVISION, PERI ORBITAL APPROACH;  Surgeon: Irene Limbo, MD;  Location: [facility];  Service: Plastics;  Laterality: Right;   Family History  Problem Relation Age of Onset   Diabetes Father    COPD Father    Pancreatic cancer  Maternal Grandmother    Social History   Socioeconomic History   Marital status: Divorced   Number of children: 2  Occupational History   Not on file  Tobacco Use   Smoking status: Former    Packs/day: 1.00    Years: 15.00    Pack years: 15.00    Types: Cigarettes    Quit date: 12/18/1985    Years since quitting: 35.2   Smokeless tobacco: Never  Vaping Use   Vaping Use: Never used  Substance and Sexual Activity   Alcohol use: Yes    Alcohol/week: 3.0 standard drinks    Types: 2 Glasses of wine, 1 Shots of liquor per week    Comment: 2 glasses /week   Drug use: Never   Sexual activity: Not Currently  Other Topics Concern   Not on file  Social History Narrative   Daughter #1 - lives in [place], Daughter #2 - lives in [place], mother lives in [place] Resource Strain: Low Risk    Difficulty of Paying Living Expenses: Not hard at all  Food Insecurity: No Food Insecurity   Worried About Charity fundraiser in the Last Year: Never true   Arboriculturist in the Last Year: Never true  Transportation Needs: No Transportation Needs   Lack of Transportation (Medical): No   Lack of Transportation (Non-Medical): No  Physical Activity: Not on file  Stress: Not on file  Social Connections: Unknown   Frequency of Communication with Friends and Family: More than three times a week   Frequency of Social Gatherings with Friends and Family: More than three times a week   Attends Religious Services: Not on Electrical engineer or Organizations: Yes   Attends Music therapist: More than 4 times per year   Marital Status: Divorced    Tobacco Counseling Counseling given: Patient does not use tobacco products   Clinical Intake:  Pre-visit preparation completed: Yes Pain : 0-10 Pain Score: 2  Pain Type: Other (Comment) (orthopedic pain) Pain Location: Foot Pain Orientation: Right   BMI - recorded: 31.68 Nutritional  Status: BMI > 30  Obese Nutritional Risks: None Diabetes: No How often do you need to have someone help you when you read instructions, pamphlets, or other written materials from your doctor or pharmacy?: 1 - Never Interpreter Needed?: No   Activities of Daily Living In your present state of health, do you have any difficulty performing the following activities: 03/14/2021 02/16/2021  Hearing? N N  Vision? N N  Difficulty concentrating or making decisions? N N  Walking or climbing stairs? N N  Dressing or bathing? N N  Doing errands, shopping? N N  Preparing Food and eating ? N -  Using the Toilet? N -  In the past six months,  have you accidently leaked urine? N -  Do you have problems with loss of bowel control? N -  Managing your Medications? N -  Managing your Finances? N -  Housekeeping or managing your Housekeeping? N -  Some recent data might be hidden    Patient Care Team: Rochel Brome, MD as PCP - General (Family Medicine) Warden Fillers, MD as Consulting Physician (Ophthalmology) Burnice Logan, [facility] (Inactive) as Pharmacist (Pharmacist) Wylene Simmer, MD as Consulting Physician (Orthopedic Surgery)  Indicate any recent Medical Services you may have received from other than [facility] providers in the past year (date may be approximate). [facility]    Assessment:   This is a routine wellness examination for [place].  Hearing/Vision screen No results found.  Dietary issues and exercise activities discussed: Current Exercise Habits: The patient does not participate in regular exercise at present  Depression Screen PHQ 2/9 Scores 02/16/2021 11/10/2020 08/18/2020 08/03/2020 04/29/2020 01/20/2020 01/12/2020  PHQ - 2 Score 1 0 0 0 1 0 0  PHQ- 9 Score 1 - 0 - 3 - -    Fall Risk Fall Risk  03/14/2021 02/16/2021 08/03/2020 04/29/2020 01/22/2020  Falls in the past year? 1 1 0 1 0  Number falls in past yr: 1 1 0 0 0  Injury with Fall? 0 0 0 0 -  Risk for fall due to : History of  fall(s);Impaired balance/gait;Orthopedic patient History of fall(s) - Impaired vision -  Follow up Falls evaluation completed;Falls prevention discussed Falls evaluation completed;Falls prevention discussed Falls evaluation completed Falls evaluation completed -    FALL RISK PREVENTION PERTAINING TO THE HOME:  Any stairs in or around the home? Yes  If so, are there any without handrails? No  Home free of loose throw rugs in walkways, pet beds, electrical cords, etc? Yes  Adequate lighting in your home to reduce risk of falls? Yes   ASSISTIVE DEVICES UTILIZED TO PREVENT FALLS:  Life alert? Yes  Her smart watch alerts her daughters  Use of a cane, walker or w/c? Yes  Grab bars in the bathroom? No  Shower chair or bench in shower? No  Elevated toilet seat or a handicapped toilet? No   Gait slow and steady with assistive device  Cognitive Function:     6CIT Screen 03/14/2021 01/20/2020  What Year? 0 points 0 points  What month? 0 points 0 points  What time? 0 points 0 points  Count back from 20 0 points 0 points  Months in reverse 0 points 0 points  Repeat phrase 0 points 0 points  Total Score 0 0    Immunizations Immunization History  Administered Date(s) Administered   Fluad Quad(high Dose 65+) 10/21/2019, 11/10/2020   Influenza, High Dose Seasonal PF 10/27/2018   Influenza-Unspecified 10/09/2018   Moderna Covid-19 Vaccine Bivalent Booster 73yrs & up 11/10/2020   Moderna SARS-COV2 Booster Vaccination 12/08/2019, 06/21/2020   Moderna Sars-Covid-2 Vaccination 02/25/2019, 03/23/2019   Pneumococcal Conjugate-13 12/21/2013   Pneumococcal Polysaccharide-23 08/29/2010   Tdap 03/11/2018   Zoster, Live 08/28/2012    TDAP status: Up to date  Flu Vaccine status: Up to date  Pneumococcal vaccine status: Up to date  Covid-19 vaccine status: Completed vaccines   Screening Tests Health Maintenance  Topic Date Due   URINE MICROALBUMIN  Never done   Hepatitis C Screening   Never done   Zoster Vaccines- Shingrix (1 of 2) Never done   Pneumonia Vaccine 110+ Years old (3) 08/29/2015   DEXA SCAN  03/24/2021  HEMOGLOBIN A1C  08/16/2021   OPHTHALMOLOGY EXAM  09/05/2021   FOOT EXAM  02/16/2022   Fecal DNA (Cologuard)  07/05/2023   TETANUS/TDAP  03/11/2028   INFLUENZA VACCINE  Completed   COVID-19 Vaccine  Completed   HPV VACCINES  Aged Out    Health Maintenance  Health Maintenance Due  Topic Date Due   URINE MICROALBUMIN  Never done   Hepatitis C Screening  Never done   Zoster Vaccines- Shingrix (1 of 2) Never done   Pneumonia Vaccine 68+ Years old (3) 08/29/2015    Colorectal cancer screening: Type of screening: Cologuard. Completed 06/2020. Repeat every 3 years  Mammogram status: Completed 03/2019. Repeat every year  Bone Density status: Completed 03/2019.  Lung Cancer Screening: (Low Dose CT Chest recommended if Age 45-80 years, 30 pack-year currently smoking OR have quit w/in 15years.) does not qualify.   Additional Screening:  Vision Screening: Recommended annual ophthalmology exams for early detection of glaucoma and other disorders of the eye. Is the patient up to date with their annual eye exam?  Yes  Who is the provider or what is the name of the office in which the patient attends annual eye exams? [place]  Dental Screening: Recommended annual dental exams for proper oral hygiene     Plan:    1- Schedule screening mammogram on the [facility] - call in a couple of weeks after patient determines if she will be having surgery 2- Healthy diet and exercise recommended 3- Complete advance directive form - bring copy for your chart  I have personally reviewed and noted the following in the patients chart:   Medical and social history Use of alcohol, tobacco or illicit drugs  Current medications and supplements including opioid prescriptions.  Functional ability and status Nutritional status Physical  activity Advanced directives List of other physicians Hospitalizations, surgeries, and ER visits in previous 12 months Vitals Screenings to include cognitive, depression, and falls Referrals and appointments  In addition, I have reviewed and discussed with patient certain preventive protocols, quality metrics, and best practice recommendations. A written personalized care plan for preventive services as well as general preventive health recommendations were provided to patient.     Erie Noe, LPN   [id number]

## 2021-03-14 NOTE — Telephone Encounter (Signed)
Patient has an upcoming dental procedure Feb 23 and is requesting an antibiotic for prophylaxis prior to her dental procedure due to her history of joint replacements. Please send to [facility].

## 2021-03-14 NOTE — Patient Instructions (Signed)
Fall Prevention in the Home, Adult °Falls can cause injuries and can happen to people of all ages. There are many things you can do to make your home safe and to help prevent falls. Ask for help when making these changes. °What actions can I take to prevent falls? °General Instructions °Use good lighting in all rooms. Replace any light bulbs that burn out. °Turn on the lights in dark areas. Use night-lights. °Keep items that you use often in easy-to-reach places. Lower the shelves around your home if needed. °Set up your furniture so you have a clear path. Avoid moving your furniture around. °Do not have throw rugs or other things on the floor that can make you trip. °Avoid walking on wet floors. °If any of your floors are uneven, fix them. °Add color or contrast paint or tape to clearly mark and help you see: °Grab bars or handrails. °First and last steps of staircases. °Where the edge of each step is. °If you use a stepladder: °Make sure that it is fully opened. Do not climb a closed stepladder. °Make sure the sides of the stepladder are locked in place. °Ask someone to hold the stepladder while you use it. °Know where your pets are when moving through your home. °What can I do in the bathroom? °  °Keep the floor dry. Clean up any water on the floor right away. °Remove soap buildup in the tub or shower. °Use nonskid mats or decals on the floor of the tub or shower. °Attach bath mats securely with double-sided, nonslip rug tape. °If you need to sit down in the shower, use a plastic, nonslip stool. °Install grab bars by the toilet and in the tub and shower. Do not use towel bars as grab bars. °What can I do in the bedroom? °Make sure that you have a light by your bed that is easy to reach. °Do not use any sheets or blankets for your bed that hang to the floor. °Have a firm chair with side arms that you can use for support when you get dressed. °What can I do in the kitchen? °Clean up any spills right away. °If you  need to reach something above you, use a step stool with a grab bar. °Keep electrical cords out of the way. °Do not use floor polish or wax that makes floors slippery. °What can I do with my stairs? °Do not leave any items on the stairs. °Make sure that you have a light switch at the top and the bottom of the stairs. °Make sure that there are handrails on both sides of the stairs. Fix handrails that are broken or loose. °Install nonslip stair treads on all your stairs. °Avoid having throw rugs at the top or bottom of the stairs. °Choose a carpet that does not hide the edge of the steps on the stairs. °Check carpeting to make sure that it is firmly attached to the stairs. Fix carpet that is loose or worn. °What can I do on the outside of my home? °Use bright outdoor lighting. °Fix the edges of walkways and driveways and fix any cracks. °Remove anything that might make you trip as you walk through a door, such as a raised step or threshold. °Trim any bushes or trees on paths to your home. °Check to see if handrails are loose or broken and that both sides of all steps have handrails. °Install guardrails along the edges of any raised decks and porches. °Clear paths of anything that can   make you trip, such as tools or rocks. °Have leaves, snow, or ice cleared regularly. °Use sand or salt on paths during winter. °Clean up any spills in your garage right away. This includes grease or oil spills. °What other actions can I take? °Wear shoes that: °Have a low heel. Do not wear high heels. °Have rubber bottoms. °Feel good on your feet and fit well. °Are closed at the toe. Do not wear open-toe sandals. °Use tools that help you move around if needed. These include: °Canes. °Walkers. °Scooters. °Crutches. °Review your medicines with your doctor. Some medicines can make you feel dizzy. This can increase your chance of falling. °Ask your doctor what else you can do to help prevent falls. °Where to find more information °Centers for  Disease Control and Prevention, [employer]: www.cdc.gov °National Institute on Aging: www.nia.nih.gov °Contact a doctor if: °You are afraid of falling at home. °You feel weak, drowsy, or dizzy at home. °You fall at home. °Summary °There are many simple things that you can do to make your home safe and to help prevent falls. °Ways to make your home safe include removing things that can make you trip and installing grab bars in the bathroom. °Ask for help when making these changes in your home. °This information is not intended to replace advice given to you by your health care provider. Make sure you discuss any questions you have with your health care provider. °Document Revised: 08/26/2019 Document Reviewed: 08/26/2019 °Elsevier Patient Education © 2022 Elsevier Inc. ° °Health Maintenance, Female °Adopting a healthy lifestyle and getting preventive care are important in promoting health and wellness. Ask your health care provider about: °The right schedule for you to have regular tests and exams. °Things you can do on your own to prevent diseases and keep yourself healthy. °What should I know about diet, weight, and exercise? °Eat a healthy diet ° °Eat a diet that includes plenty of vegetables, fruits, low-fat dairy products, and lean protein. °Do not eat a lot of foods that are high in solid fats, added sugars, or sodium. °Maintain a healthy weight °Body mass index (BMI) is used to identify weight problems. It estimates body fat based on height and weight. Your health care provider can help determine your BMI and help you achieve or maintain a healthy weight. °Get regular exercise °Get regular exercise. This is one of the most important things you can do for your health. Most adults should: °Exercise for at least 150 minutes each week. The exercise should increase your heart rate and make you sweat (moderate-intensity exercise). °Do strengthening exercises at least twice a week. This is in addition to the  moderate-intensity exercise. °Spend less time sitting. Even light physical activity can be beneficial. °Watch cholesterol and blood lipids °Have your blood tested for lipids and cholesterol at 76 years of age, then have this test every 5 years. °Have your cholesterol levels checked more often if: °Your lipid or cholesterol levels are high. °You are older than 76 years of age. °You are at high risk for heart disease. °What should I know about cancer screening? °Depending on your health history and family history, you may need to have cancer screening at various ages. This may include screening for: °Breast cancer. °Cervical cancer. °Colorectal cancer. °Skin cancer. °Lung cancer. °What should I know about heart disease, diabetes, and high blood pressure? °Blood pressure and heart disease °High blood pressure causes heart disease and increases the risk of stroke. This is more likely to develop in   people who have high blood pressure readings or are overweight. °Have your blood pressure checked: °Every 3-5 years if you are 18-39 years of age. °Every year if you are 40 years old or older. °Diabetes °Have regular diabetes screenings. This checks your fasting blood sugar level. Have the screening done: °Once every three years after age 40 if you are at a normal weight and have a low risk for diabetes. °More often and at a younger age if you are overweight or have a high risk for diabetes. °What should I know about preventing infection? °Hepatitis B °If you have a higher risk for hepatitis B, you should be screened for this virus. Talk with your health care provider to find out if you are at risk for hepatitis B infection. °Hepatitis C °Testing is recommended for: °Everyone born from 1945 through 1965. °Anyone with known risk factors for hepatitis C. °Sexually transmitted infections (STIs) °Get screened for STIs, including gonorrhea and chlamydia, if: °You are sexually active and are younger than 76 years of age. °You are  older than 76 years of age and your health care provider tells you that you are at risk for this type of infection. °Your sexual activity has changed since you were last screened, and you are at increased risk for chlamydia or gonorrhea. Ask your health care provider if you are at risk. °Ask your health care provider about whether you are at high risk for HIV. Your health care provider may recommend a prescription medicine to help prevent HIV infection. If you choose to take medicine to prevent HIV, you should first get tested for HIV. You should then be tested every 3 months for as long as you are taking the medicine. °Pregnancy °If you are about to stop having your period (premenopausal) and you may become pregnant, seek counseling before you get pregnant. °Take 400 to 800 micrograms (mcg) of folic acid every day if you become pregnant. °Ask for birth control (contraception) if you want to prevent pregnancy. °Osteoporosis and menopause °Osteoporosis is a disease in which the bones lose minerals and strength with aging. This can result in bone fractures. If you are 65 years old or older, or if you are at risk for osteoporosis and fractures, ask your health care provider if you should: °Be screened for bone loss. °Take a calcium or vitamin D supplement to lower your risk of fractures. °Be given hormone replacement therapy (HRT) to treat symptoms of menopause. °Follow these instructions at home: °Alcohol use °Do not drink alcohol if: °Your health care provider tells you not to drink. °You are pregnant, may be pregnant, or are planning to become pregnant. °If you drink alcohol: °Limit how much you have to: °0-1 drink a day. °Know how much alcohol is in your drink. In the [place] one drink equals one 12 oz bottle of beer (355 mL), one 5 oz glass of wine (148 mL), or one 1½ oz glass of hard liquor (44 mL). °Lifestyle °Do not use any products that contain nicotine or tobacco. These products include cigarettes, chewing  tobacco, and vaping devices, such as e-cigarettes. If you need help quitting, ask your health care provider. °Do not use street drugs. °Do not share needles. °Ask your health care provider for help if you need support or information about quitting drugs. °General instructions °Schedule regular health, dental, and eye exams. °Stay current with your vaccines. °Tell your health care provider if: °You often feel depressed. °You have ever been abused or do not feel   safe at home. °Summary °Adopting a healthy lifestyle and getting preventive care are important in promoting health and wellness. °Follow your health care provider's instructions about healthy diet, exercising, and getting tested or screened for diseases. °Follow your health care provider's instructions on monitoring your cholesterol and blood pressure. °This information is not intended to replace advice given to you by your health care provider. Make sure you discuss any questions you have with your health care provider. °Document Revised: 06/13/2020 Document Reviewed: 06/13/2020 °Elsevier Patient Education © 2022 Elsevier Inc. ° °

## 2021-03-17 DIAGNOSIS — G4733 Obstructive sleep apnea (adult) (pediatric): Secondary | ICD-10-CM | POA: Diagnosis not present

## 2021-03-22 DIAGNOSIS — M19071 Primary osteoarthritis, right ankle and foot: Secondary | ICD-10-CM | POA: Diagnosis not present

## 2021-03-22 DIAGNOSIS — M76829 Posterior tibial tendinitis, unspecified leg: Secondary | ICD-10-CM | POA: Diagnosis not present

## 2021-03-22 DIAGNOSIS — M25579 Pain in unspecified ankle and joints of unspecified foot: Secondary | ICD-10-CM | POA: Diagnosis not present

## 2021-03-22 DIAGNOSIS — M79671 Pain in right foot: Secondary | ICD-10-CM | POA: Diagnosis not present

## 2021-03-30 ENCOUNTER — Telehealth: Payer: Self-pay

## 2021-03-30 NOTE — Chronic Care Management (AMB) (Signed)
° ° °  Chronic Care Management Pharmacy Assistant   Name: Connie Marks  MRN: [id number] DOB: Aug 05, 1945  Reason for Encounter: Disease State/ Diabetes  Recent office visits:  03-14-2021 Erie Noe, LPN. Medicare annual wellness visit. MM digital screening ordered.  02-16-2021 Cox, Elnita Maxwell, MD. Creatinine= 1.13, eGFR= 51, Sodium= 145. A1C= 5.9, LDL= 109.  Recent consult visits:  02-08-2021 Lloyd Huger, MD (Orthopaedic). Unable to view encounter.  Hospital visits:  None in previous 6 months  Medications: Outpatient Encounter Medications as of 03/30/2021  Medication Sig   amoxicillin (AMOXIL) 500 MG tablet Take 4 tablets (2,000 mg total) by mouth daily.   aspirin EC 81 MG tablet Take 81 mg by mouth daily. Swallow whole.   buPROPion (WELLBUTRIN SR) 150 MG 12 hr tablet TAKE ONE TABLET BY MOUTH TWICE DAILY   Calcium Carbonate+Vitamin D 600-200 MG-UNIT TABS Take 1 tablet by mouth daily.   citalopram (CELEXA) 40 MG tablet Take 1 tablet (40 mg total) by mouth in the morning.   famotidine (PEPCID) 40 MG tablet Take 40 mg by mouth at bedtime.   lansoprazole (PREVACID) 30 MG capsule Take 1 capsule (30 mg total) by mouth daily at 12 noon.   Multiple Vitamins-Minerals (MULTIVITAL PO) Take by mouth.   Polyethyl Glycol-Propyl Glycol (SYSTANE) 0.4-0.3 % SOLN Apply 1 drop to eye daily as needed.   pramipexole (MIRAPEX) 0.5 MG tablet TAKE ONE TABLET BY MOUTH THREE TIMES DAILY   simvastatin (ZOCOR) 40 MG tablet TAKE ONE TABLET BY MOUTH DAILY   traZODone (DESYREL) 50 MG tablet TAKE 1/2 TO 1 TABLET BY MOUTH AT BEDTIME AS NEEDED FOR SLEEP   valACYclovir (VALTREX) 1000 MG tablet Take 2 tablets (2,000 mg total) by mouth 2 (two) times daily at onset   No facility-administered encounter medications on file as of 03/30/2021.   Recent Relevant Labs: Lab Results  Component Value Date/Time   HGBA1C 5.9 (H) 02/16/2021 09:05 AM   HGBA1C 5.9 (H) 11/10/2020 09:08 AM    Kidney Function Lab Results   Component Value Date/Time   CREATININE 1.13 (H) 02/16/2021 09:05 AM   CREATININE 1.11 (H) 11/10/2020 09:08 AM   GFRNONAA 54 (L) 01/22/2020 09:37 AM   GFRAA 62 01/22/2020 09:37 AM     03-30-2021: 1st attempt left VM 03-31-2021: 2nd attempt left VM 04-03-2021: 3rd attempt left VM  Adherence Review: Is the patient currently on a STATIN medication? Yes Is the patient currently on ACE/ARB medication? No Does the patient have >5 day gap between last estimated fill dates? CPP to review  Care Gaps: Last eye exam / Retinopathy Screening? 09-19-2020 Last Annual Wellness Visit? 01-20-2020 Last Diabetic Foot Exam? Needs to reschedule appointment  Star Rating Drugs: Simvastatin 40 mg- Last filled 02-03-2021 90 DS  [place] Clinical Pharmacist Assistant [phone]

## 2021-04-05 ENCOUNTER — Other Ambulatory Visit: Payer: Self-pay

## 2021-04-05 ENCOUNTER — Telehealth: Payer: Self-pay

## 2021-04-05 ENCOUNTER — Ambulatory Visit
Admission: RE | Admit: 2021-04-05 | Discharge: 2021-04-05 | Disposition: A | Discharge status: Home / Self Care | Payer: Medicare Other | Source: Ambulatory Visit | Attending: Family Medicine | Admitting: Family Medicine

## 2021-04-05 DIAGNOSIS — M76829 Posterior tibial tendinitis, unspecified leg: Secondary | ICD-10-CM | POA: Diagnosis not present

## 2021-04-05 DIAGNOSIS — Z1231 Encounter for screening mammogram for malignant neoplasm of breast: Secondary | ICD-10-CM | POA: Diagnosis not present

## 2021-04-05 NOTE — Chronic Care Management (AMB) (Signed)
? ? ?  Chronic Care Management ?Pharmacy Assistant  ? ?Name: Connie Marks  MRN: [id number] DOB: 1945/08/15 ? ?Reason for Encounter: Patient Assistance Coordination ? ?04/05/2021- [facility] to add patient to current [employer] portal. Unable to find patient, called patient to see if she has her [employer] ID available and what medication was she getting assistance for. No answer, left message to return call.  ? ?Medications: ?Outpatient Encounter Medications as of 04/05/2021  ?Medication Sig  ? amoxicillin (AMOXIL) 500 MG tablet Take 4 tablets (2,000 mg total) by mouth daily.  ? aspirin EC 81 MG tablet Take 81 mg by mouth daily. Swallow whole.  ? buPROPion (WELLBUTRIN SR) 150 MG 12 hr tablet TAKE ONE TABLET BY MOUTH TWICE DAILY  ? Calcium Carbonate+Vitamin D 600-200 MG-UNIT TABS Take 1 tablet by mouth daily.  ? citalopram (CELEXA) 40 MG tablet Take 1 tablet (40 mg total) by mouth in the morning.  ? famotidine (PEPCID) 40 MG tablet Take 40 mg by mouth at bedtime.  ? lansoprazole (PREVACID) 30 MG capsule Take 1 capsule (30 mg total) by mouth daily at 12 noon.  ? Multiple Vitamins-Minerals (MULTIVITAL PO) Take by mouth.  ? Polyethyl Glycol-Propyl Glycol (SYSTANE) 0.4-0.3 % SOLN Apply 1 drop to eye daily as needed.  ? pramipexole (MIRAPEX) 0.5 MG tablet TAKE ONE TABLET BY MOUTH THREE TIMES DAILY  ? simvastatin (ZOCOR) 40 MG tablet TAKE ONE TABLET BY MOUTH DAILY  ? traZODone (DESYREL) 50 MG tablet TAKE 1/2 TO 1 TABLET BY MOUTH AT BEDTIME AS NEEDED FOR SLEEP  ? valACYclovir (VALTREX) 1000 MG tablet Take 2 tablets (2,000 mg total) by mouth 2 (two) times daily at onset  ? ?No facility-administered encounter medications on file as of 04/05/2021.  ? ? ?Pattricia Boss, CMA ?Clinical Pharmacist Assistant ?[phone] ? ?

## 2021-04-07 ENCOUNTER — Other Ambulatory Visit: Payer: Self-pay | Admitting: Family Medicine

## 2021-04-14 DIAGNOSIS — G4733 Obstructive sleep apnea (adult) (pediatric): Secondary | ICD-10-CM | POA: Diagnosis not present

## 2021-05-05 ENCOUNTER — Other Ambulatory Visit: Payer: Self-pay | Admitting: Family Medicine

## 2021-05-05 DIAGNOSIS — K219 Gastro-esophageal reflux disease without esophagitis: Secondary | ICD-10-CM

## 2021-05-15 DIAGNOSIS — G4733 Obstructive sleep apnea (adult) (pediatric): Secondary | ICD-10-CM | POA: Diagnosis not present

## 2021-05-25 ENCOUNTER — Other Ambulatory Visit: Payer: Self-pay | Admitting: Family Medicine

## 2021-05-25 DIAGNOSIS — F330 Major depressive disorder, recurrent, mild: Secondary | ICD-10-CM

## 2021-05-30 ENCOUNTER — Telehealth: Payer: Self-pay

## 2021-05-30 NOTE — Chronic Care Management (AMB) (Signed)
? ? ?  Chronic Care Management ?Pharmacy Assistant  ? ?Name: Connie Marks  MRN: [id number] DOB: Nov 18, 1945 ? ? ?Reason for Encounter: Disease State/ General ? ?Recent office visits:  ?03-14-2021 Erie Noe, LPN Medicare annual visit. Order placed for [facility] DIABETES EYE EXAM. Completed MM 3D screen breast bilateral. ? ?02-16-2021 Cox, Elnita Maxwell, MD. Creatinine= 1.13, eGFR= 51, Sodium= 145. A1C= 5.9, LDL= 109. ? ?Recent consult visits:  ?04-05-2021 Mammogram completed. ? ?02-08-2021 Lloyd Huger, MD (Orthopaedic). Unable to view encounter. ? ?Hospital visits:  ?None in previous 6 months ? ?Medications: ?Outpatient Encounter Medications as of 05/30/2021  ?Medication Sig  ? amoxicillin (AMOXIL) 500 MG tablet Take 4 tablets (2,000 mg total) by mouth daily.  ? aspirin EC 81 MG tablet Take 81 mg by mouth daily. Swallow whole.  ? buPROPion (WELLBUTRIN SR) 150 MG 12 hr tablet TAKE ONE TABLET BY MOUTH TWICE DAILY  ? Calcium Carbonate+Vitamin D 600-200 MG-UNIT TABS Take 1 tablet by mouth daily.  ? citalopram (CELEXA) 40 MG tablet Take 1 tablet (40 mg total) by mouth in the morning.  ? famotidine (PEPCID) 40 MG tablet Take 40 mg by mouth at bedtime.  ? lansoprazole (PREVACID) 30 MG capsule Take 1 capsule (30 mg total) by mouth daily at 12 noon.  ? Multiple Vitamins-Minerals (MULTIVITAL PO) Take by mouth.  ? Polyethyl Glycol-Propyl Glycol (SYSTANE) 0.4-0.3 % SOLN Apply 1 drop to eye daily as needed.  ? pramipexole (MIRAPEX) 0.5 MG tablet TAKE ONE TABLET BY MOUTH THREE TIMES DAILY  ? simvastatin (ZOCOR) 40 MG tablet TAKE ONE TABLET BY MOUTH DAILY  ? traZODone (DESYREL) 50 MG tablet TAKE 1/2 TO 1 TABLET BY MOUTH AT BEDTIME AS NEEDED FOR SLEEP  ? valACYclovir (VALTREX) 1000 MG tablet Take 2 tablets (2,000 mg total) by mouth 2 (two) times daily at onset  ? ?No facility-administered encounter medications on file as of 05/30/2021.  ? ? ?05-30-2021: 1st attempt left VM ?06-01-2021: 2nd attempt left VM ?06-02-2021: 3rd attempt left  VM ? ?Care Gaps: ?Hep c screening overdue ?PNA Vac overdue ?Shingrix overdue ?Dexa scan overdue ?Last AWV 03-14-2021 ? ?Star Rating Drugs: ?Simvastatin 40 mg- Last filled 05-09-2021 90 DS ? ?Malecca Hicks CMA ?Clinical Pharmacist Assistant ?[phone] ? ?

## 2021-06-14 DIAGNOSIS — G4733 Obstructive sleep apnea (adult) (pediatric): Secondary | ICD-10-CM | POA: Diagnosis not present

## 2021-07-15 DIAGNOSIS — G4733 Obstructive sleep apnea (adult) (pediatric): Secondary | ICD-10-CM | POA: Diagnosis not present

## 2021-07-18 ENCOUNTER — Ambulatory Visit (INDEPENDENT_AMBULATORY_CARE_PROVIDER_SITE_OTHER): Payer: Medicare Other | Admitting: Family Medicine

## 2021-07-18 ENCOUNTER — Encounter: Payer: Self-pay | Admitting: Family Medicine

## 2021-07-18 VITALS — BP 128/70 | HR 80 | Temp 97.1°F | Resp 16 | Ht 65.0 in | Wt 199.0 lb

## 2021-07-18 DIAGNOSIS — L03317 Cellulitis of buttock: Secondary | ICD-10-CM

## 2021-07-18 DIAGNOSIS — L0231 Cutaneous abscess of buttock: Secondary | ICD-10-CM | POA: Insufficient documentation

## 2021-07-18 MED ORDER — CEPHALEXIN 500 MG PO CAPS: 500.0000 mg | ORAL_CAPSULE | Freq: Three times a day (TID) | ORAL | 0 refills | Status: DC

## 2021-07-18 MED ORDER — CEFTRIAXONE SODIUM 1 G IJ SOLR
1.0000 g | Freq: Once | INTRAMUSCULAR | Status: AC
  Administered 2021-07-18: 1 g via INTRAMUSCULAR

## 2021-07-18 NOTE — Assessment & Plan Note (Signed)
Rx: keflex.  Recommend acetaminophen. Recommend warm wet compresses.

## 2021-07-18 NOTE — Patient Instructions (Addendum)
Rx: keflex.  Recommend acetaminophen. Recommend warm wet compresses. 

## 2021-07-18 NOTE — Progress Notes (Signed)
Acute Office Visit  Subjective:    Patient ID: Connie Marks, female    DOB: [birth year], 76 y.o.   MRN: [id number]  Chief Complaint  Patient presents with   Abscess    HPI: Patient is in today for uncomfortable bumps. Dxd lipomas in the past, but drove to [facility], [place] and was very uncomfortable. NO fever. No chills. Had a lot of pain. No drainage.   Past Medical History:  Diagnosis Date   Anxiety    Depression    GERD (gastroesophageal reflux disease)    Hyperlipidemia    Prediabetes    Sleep apnea    uses CPAP every night    Past Surgical History:  Procedure Laterality Date   CESAREAN SECTION     DIAGNOSTIC LAPAROSCOPY     HERNIA REPAIR     JOINT REPLACEMENT     knee replacements bilat   JOINT REPLACEMENT     hip replacements bilat   ORIF ORBITAL FRACTURE Right 12/19/2018   Procedure: OPEN TREATMENT RIGHT ORBITAL FLOOR WITH IMPLANT, PERIORBITAL APPROACH;  Surgeon: Irene Limbo, MD;  Location: [place];  Service: Plastics;  Laterality: Right;   ORIF ORBITAL FRACTURE Right 02/03/2019   Procedure: OPEN TREATMENT RIGHT ORBITAL FLOOR FRACTURE REVISION, PERI ORBITAL APPROACH;  Surgeon: Irene Limbo, MD;  Location: [facility];  Service: Plastics;  Laterality: Right;    Family History  Problem Relation Age of Onset   Diabetes Father    COPD Father    Pancreatic cancer Maternal Grandmother    Breast cancer Neg Hx     Social History   Socioeconomic History   Marital status: Divorced    Spouse name: Not on file   Number of children: 2   Years of education: Not on file   Highest education level: Not on file  Occupational History   Not on file  Tobacco Use   Smoking status: Former    Packs/day: 1.00    Years: 15.00    Total pack years: 15.00    Types: Cigarettes    Quit date: 12/18/1985    Years since quitting: 35.6   Smokeless tobacco: Never  Vaping Use   Vaping Use: Never used  Substance and Sexual Activity    Alcohol use: Yes    Alcohol/week: 3.0 standard drinks of alcohol    Types: 2 Glasses of wine, 1 Shots of liquor per week    Comment: 2 glasses /week   Drug use: Never   Sexual activity: Not Currently  Other Topics Concern   Not on file  Social History Narrative   Daughter #1 - lives in [place], Daughter #2 - lives in [place], mother lives in [place] Strain: [place]  (01/06/2021)   Overall Financial Resource Strain (CARDIA)    Difficulty of Paying Living Expenses: Not hard at all  Food Insecurity: No Food Insecurity (03/14/2021)   Hunger Vital Sign    Worried About Running Out of Food in the Last Year: Never true    [place] in the Last Year: Never true  Transportation Needs: No Transportation Needs (03/14/2021)   PRAPARE - Hydrologist (Medical): No    Lack of Transportation (Non-Medical): No  Physical Activity: Not on file  Stress: Not on file  Social Connections: Unknown (03/14/2021)   Social Connection and Isolation Panel [NHANES]    Frequency of Communication with Friends and  Family: More than three times a week    Frequency of Social Gatherings with Friends and Family: More than three times a week    Attends Religious Services: Not on file    Active Member of Clubs or Organizations: Yes    Attends Music therapist: More than 4 times per year    Marital Status: Divorced  Human resources officer Violence: Not on file    Outpatient Medications Prior to Visit  Medication Sig Dispense Refill   aspirin EC 81 MG tablet Take 81 mg by mouth daily. Swallow whole.     buPROPion (WELLBUTRIN SR) 150 MG 12 hr tablet TAKE ONE TABLET BY MOUTH TWICE DAILY 180 tablet 1   Calcium Carbonate+Vitamin D 600-200 MG-UNIT TABS Take 1 tablet by mouth daily.     citalopram (CELEXA) 40 MG tablet Take 1 tablet (40 mg total) by mouth in the morning. 90 tablet 0   famotidine (PEPCID) 40 MG tablet Take 40 mg by mouth  at bedtime.     lansoprazole (PREVACID) 30 MG capsule Take 1 capsule (30 mg total) by mouth daily at 12 noon. 90 capsule 3   Multiple Vitamins-Minerals (MULTIVITAL PO) Take by mouth.     Polyethyl Glycol-Propyl Glycol (SYSTANE) 0.4-0.3 % SOLN Apply 1 drop to eye daily as needed.     pramipexole (MIRAPEX) 0.5 MG tablet TAKE ONE TABLET BY MOUTH THREE TIMES DAILY 30 tablet 3   simvastatin (ZOCOR) 40 MG tablet TAKE ONE TABLET BY MOUTH DAILY 90 tablet 0   traZODone (DESYREL) 50 MG tablet TAKE 1/2 TO 1 TABLET BY MOUTH AT BEDTIME AS NEEDED FOR SLEEP 90 tablet 1   valACYclovir (VALTREX) 1000 MG tablet Take 2 tablets (2,000 mg total) by mouth 2 (two) times daily at onset 24 tablet 0   amoxicillin (AMOXIL) 500 MG tablet Take 4 tablets (2,000 mg total) by mouth daily. 4 tablet 0   No facility-administered medications prior to visit.    Allergies  Allergen Reactions   Adhesive [Tape]     Red whelps     Review of Systems  Constitutional:  Negative for chills and fever.  Cardiovascular:  Negative for chest pain.  Gastrointestinal:  Negative for abdominal pain, constipation, nausea and vomiting.  Genitourinary:  Negative for dysuria and frequency.  Skin:  Negative for rash.       Skin lesions   Psychiatric/Behavioral:  Negative for dysphoric mood. The patient is not nervous/anxious.        Objective:    Physical Exam Vitals reviewed.  Constitutional:      Appearance: Normal appearance.  Skin:    Findings: Lesion present.       Neurological:     Mental Status: She is alert.     BP 128/70   Pulse 80   Temp (!) 97.1 F (36.2 C)   Resp 16   Ht _0  (1.651 m)   Wt 199 lb (90.3 kg)   BMI 33.12 kg/m  Wt Readings from Last 3 Encounters:  07/18/21 199 lb (90.3 kg)  03/14/21 190 lb 6.4 oz (86.4 kg)  02/16/21 190 lb (86.2 kg)    Health Maintenance Due  Topic Date Due   URINE MICROALBUMIN  Never done   Hepatitis C Screening  Never done   Zoster Vaccines- Shingrix (1 of 2) Never  done   Pneumonia Vaccine 75+ Years old (3 - PPSV23 if available, else PCV20) 08/29/2015   COVID-19 Vaccine (4 - Moderna series) 03/13/2021   DEXA SCAN  03/24/2021    There are no preventive care reminders to display for this patient.   No results found for: "TSH" Lab Results  Component Value Date   WBC 7.7 02/16/2021   HGB 12.6 02/16/2021   HCT 37.8 02/16/2021   MCV 91 02/16/2021   PLT 232 02/16/2021   Lab Results  Component Value Date   NA 145 (H) 02/16/2021   K 5.0 02/16/2021   CO2 23 02/16/2021   GLUCOSE 94 02/16/2021   BUN 21 02/16/2021   CREATININE 1.13 (H) 02/16/2021   BILITOT 0.5 02/16/2021   ALKPHOS 75 02/16/2021   AST 20 02/16/2021   ALT 17 02/16/2021   PROT 6.7 02/16/2021   ALBUMIN 4.4 02/16/2021   CALCIUM 9.5 02/16/2021   EGFR 51 (L) 02/16/2021   Lab Results  Component Value Date   CHOL 188 02/16/2021   Lab Results  Component Value Date   HDL 63 02/16/2021   Lab Results  Component Value Date   LDLCALC 109 (H) 02/16/2021   Lab Results  Component Value Date   TRIG 89 02/16/2021   Lab Results  Component Value Date   CHOLHDL 3.0 02/16/2021   Lab Results  Component Value Date   HGBA1C 5.9 (H) 02/16/2021       Assessment & Plan:   Problem List Items Addressed This Visit       Other   Abscess and cellulitis of gluteal region - Primary    Rx: keflex.  Recommend acetaminophen. Recommend warm wet compresses.      Relevant Medications   cefTRIAXone (ROCEPHIN) injection 1 g   cephALEXin (KEFLEX) 500 MG capsule   Other Relevant Orders   Ambulatory referral to General Surgery   Meds ordered this encounter  Medications   cefTRIAXone (ROCEPHIN) injection 1 g   cephALEXin (KEFLEX) 500 MG capsule    Sig: Take 1 capsule (500 mg total) by mouth 3 (three) times daily.    Dispense:  21 capsule    Refill:  0    Orders Placed This Encounter  Procedures   Ambulatory referral to General Surgery     Follow-up: No follow-ups on file.  An  After Visit Summary was printed and given to the patient.  Rochel Brome, MD [facility] [phone]

## 2021-07-19 DIAGNOSIS — L0231 Cutaneous abscess of buttock: Secondary | ICD-10-CM | POA: Diagnosis not present

## 2021-07-25 DIAGNOSIS — L0231 Cutaneous abscess of buttock: Secondary | ICD-10-CM | POA: Diagnosis not present

## 2021-07-26 DIAGNOSIS — M76829 Posterior tibial tendinitis, unspecified leg: Secondary | ICD-10-CM | POA: Diagnosis not present

## 2021-08-09 DIAGNOSIS — M76821 Posterior tibial tendinitis, right leg: Secondary | ICD-10-CM | POA: Diagnosis not present

## 2021-08-12 ENCOUNTER — Other Ambulatory Visit: Payer: Self-pay | Admitting: Family Medicine

## 2021-08-12 DIAGNOSIS — E782 Mixed hyperlipidemia: Secondary | ICD-10-CM

## 2021-08-14 DIAGNOSIS — G4733 Obstructive sleep apnea (adult) (pediatric): Secondary | ICD-10-CM | POA: Diagnosis not present

## 2021-08-16 DIAGNOSIS — L0231 Cutaneous abscess of buttock: Secondary | ICD-10-CM | POA: Diagnosis not present

## 2021-08-18 ENCOUNTER — Ambulatory Visit (INDEPENDENT_AMBULATORY_CARE_PROVIDER_SITE_OTHER): Payer: Medicare Other | Admitting: Family Medicine

## 2021-08-18 ENCOUNTER — Encounter: Payer: Self-pay | Admitting: Family Medicine

## 2021-08-18 VITALS — BP 134/78 | HR 68 | Temp 98.3°F | Ht 65.0 in | Wt 199.0 lb

## 2021-08-18 DIAGNOSIS — K219 Gastro-esophageal reflux disease without esophagitis: Secondary | ICD-10-CM | POA: Diagnosis not present

## 2021-08-18 DIAGNOSIS — Z9989 Dependence on other enabling machines and devices: Secondary | ICD-10-CM | POA: Diagnosis not present

## 2021-08-18 DIAGNOSIS — F330 Major depressive disorder, recurrent, mild: Secondary | ICD-10-CM | POA: Diagnosis not present

## 2021-08-18 DIAGNOSIS — E1169 Type 2 diabetes mellitus with other specified complication: Secondary | ICD-10-CM

## 2021-08-18 DIAGNOSIS — G4733 Obstructive sleep apnea (adult) (pediatric): Secondary | ICD-10-CM

## 2021-08-18 DIAGNOSIS — G2581 Restless legs syndrome: Secondary | ICD-10-CM

## 2021-08-18 DIAGNOSIS — E6609 Other obesity due to excess calories: Secondary | ICD-10-CM

## 2021-08-18 DIAGNOSIS — I10 Essential (primary) hypertension: Secondary | ICD-10-CM | POA: Diagnosis not present

## 2021-08-18 DIAGNOSIS — R7303 Prediabetes: Secondary | ICD-10-CM

## 2021-08-18 DIAGNOSIS — Z6833 Body mass index (BMI) 33.0-33.9, adult: Secondary | ICD-10-CM

## 2021-08-18 DIAGNOSIS — E782 Mixed hyperlipidemia: Secondary | ICD-10-CM | POA: Diagnosis not present

## 2021-08-18 NOTE — Progress Notes (Unsigned)
Subjective:  Patient ID: Connie Marks, female    DOB: [birth year]  Age: 76 y.o. MRN: [id number]  Chief Complaint  Patient presents with   Hyperlipidemia   Depression    HPI: Prediabetes: Patient normally eats healthy.  Unable to exercise due to pain on her right foot and ankle. She does not check her blood sugar. Foot Surgery scheduled in [place] for 09/25/2021. Hyperlipidemia: She is currently taking simvastatin 40 mg daily. GERD: Takes Lansoprazole 30 mg  daily, Famotidine 40 mg at bedtime. Depression: Patient is taking Wellbutrin 150 mg BID, Celexa 40 mg daily.    08/18/2021    8:27 AM 02/16/2021    8:19 AM 11/10/2020    8:33 AM  PHQ9 SCORE ONLY  PHQ-9 Total Score 1 1 0   OSA: on cpap. Doing well     Foot Surgery scheduled in [place] for 09/25/2021.  Current Outpatient Medications on File Prior to Visit  Medication Sig Dispense Refill   aspirin EC 81 MG tablet Take 81 mg by mouth daily. Swallow whole.     buPROPion (WELLBUTRIN SR) 150 MG 12 hr tablet TAKE ONE TABLET BY MOUTH TWICE DAILY 180 tablet 1   Calcium Carbonate+Vitamin D 600-200 MG-UNIT TABS Take 1 tablet by mouth daily.     citalopram (CELEXA) 40 MG tablet Take 1 tablet (40 mg total) by mouth in the morning. 90 tablet 0   clindamycin (CLEOCIN) 150 MG capsule Take 300 mg by mouth 4 (four) times daily.     famotidine (PEPCID) 40 MG tablet Take 40 mg by mouth at bedtime.     lansoprazole (PREVACID) 30 MG capsule Take 1 capsule (30 mg total) by mouth daily at 12 noon. 90 capsule 3   Multiple Vitamins-Minerals (MULTIVITAL PO) Take by mouth.     Polyethyl Glycol-Propyl Glycol (SYSTANE) 0.4-0.3 % SOLN Apply 1 drop to eye daily as needed.     pramipexole (MIRAPEX) 0.5 MG tablet TAKE ONE TABLET BY MOUTH THREE TIMES DAILY 30 tablet 3   simvastatin (ZOCOR) 40 MG tablet TAKE ONE TABLET BY MOUTH DAILY 90 tablet 0   traZODone (DESYREL) 50 MG tablet TAKE 1/2 TO 1 TABLET BY MOUTH AT BEDTIME AS NEEDED FOR SLEEP 90 tablet 1    valACYclovir (VALTREX) 1000 MG tablet Take 2 tablets (2,000 mg total) by mouth 2 (two) times daily at onset 24 tablet 0   No current facility-administered medications on file prior to visit.   Past Medical History:  Diagnosis Date   Anxiety    Depression    GERD (gastroesophageal reflux disease)    Hyperlipidemia    Prediabetes    Sleep apnea    uses CPAP every night   Past Surgical History:  Procedure Laterality Date   CESAREAN SECTION     DIAGNOSTIC LAPAROSCOPY     HERNIA REPAIR     JOINT REPLACEMENT     knee replacements bilat   JOINT REPLACEMENT     hip replacements bilat   ORIF ORBITAL FRACTURE Right 12/19/2018   Procedure: OPEN TREATMENT RIGHT ORBITAL FLOOR WITH IMPLANT, PERIORBITAL APPROACH;  Surgeon: Glenna Fellows, MD;  Location: [facility];  Service: Plastics;  Laterality: Right;   ORIF ORBITAL FRACTURE Right 02/03/2019   Procedure: OPEN TREATMENT RIGHT ORBITAL FLOOR FRACTURE REVISION, PERI ORBITAL APPROACH;  Surgeon: Glenna Fellows, MD;  Location: [facility];  Service: Plastics;  Laterality: Right;    Family History  Problem Relation Age of Onset   Diabetes Father  COPD Father    Pancreatic cancer Maternal Grandmother    Breast cancer Neg Hx    Social History   Socioeconomic History   Marital status: Divorced    Spouse name: Not on file   Number of children: 2   Years of education: Not on file   Highest education level: Not on file  Occupational History   Not on file  Tobacco Use   Smoking status: Former    Packs/day: 1.00    Years: 15.00    Total pack years: 15.00    Types: Cigarettes    Quit date: 12/18/1985    Years since quitting: 35.6   Smokeless tobacco: Never  Vaping Use   Vaping Use: Never used  Substance and Sexual Activity   Alcohol use: Yes    Alcohol/week: 3.0 standard drinks of alcohol    Types: 2 Glasses of wine, 1 Shots of liquor per week    Comment: 2 glasses /week   Drug use: Never   Sexual  activity: Not Currently  Other Topics Concern   Not on file  Social History Narrative   Daughter #1 - lives in [place], Daughter #2 - lives in [place], mother lives in [place]    Social Determinants of Health   Financial Resource Strain: Low Risk  (01/06/2021)   Overall Financial Resource Strain (CARDIA)    Difficulty of Paying Living Expenses: Not hard at all  Food Insecurity: No Food Insecurity (03/14/2021)   Hunger Vital Sign    Worried About Running Out of Food in the Last Year: Never true    Ran Out of Food in the Last Year: Never true  Transportation Needs: No Transportation Needs (03/14/2021)   PRAPARE - Administrator, Civil Service (Medical): No    Lack of Transportation (Non-Medical): No  Physical Activity: Not on file  Stress: Not on file  Social Connections: Unknown (03/14/2021)   Social Connection and Isolation Panel [NHANES]    Frequency of Communication with Friends and Family: More than three times a week    Frequency of Social Gatherings with Friends and Family: More than three times a week    Attends Religious Services: Not on Marketing executive or Organizations: Yes    Attends Banker Meetings: More than 4 times per year    Marital Status: Divorced    Review of Systems  Constitutional:  Negative for appetite change, fatigue and fever.  HENT:  Negative for congestion, ear pain, sinus pressure and sore throat.   Respiratory:  Negative for cough, chest tightness, shortness of breath and wheezing.   Cardiovascular:  Negative for chest pain and palpitations.  Gastrointestinal:  Negative for abdominal pain, constipation, diarrhea, nausea and vomiting.  Genitourinary:  Negative for dysuria and hematuria.  Musculoskeletal:  Negative for arthralgias, back pain, joint swelling and myalgias.  Skin:  Negative for rash.  Neurological:  Negative for dizziness, weakness and headaches.  Psychiatric/Behavioral:  Negative for dysphoric mood. The  patient is not nervous/anxious.      Objective:  BP 134/78 (BP Location: Left Arm, Patient Position: Sitting)   Pulse 68   Temp 98.3 F (36.8 C) (Oral)   Ht 5\' 5"  (1.651 m)   Wt 199 lb (90.3 kg)   SpO2 98%   BMI 33.12 kg/m      08/18/2021    8:11 AM 07/18/2021    2:23 PM 03/14/2021   10:09 AM  BP/Weight  Systolic BP 134 128  123  Diastolic BP 78 70 78  Wt. (Lbs) 199 199 190.4  BMI 33.12 kg/m2 33.12 kg/m2 31.68 kg/m2    Physical Exam Vitals reviewed.  Constitutional:      Appearance: Normal appearance. She is normal weight.  Cardiovascular:     Rate and Rhythm: Normal rate and regular rhythm.     Heart sounds: Normal heart sounds.  Pulmonary:     Effort: Pulmonary effort is normal.     Breath sounds: Normal breath sounds.  Abdominal:     General: Abdomen is flat. Bowel sounds are normal.     Palpations: Abdomen is soft.  Neurological:     Mental Status: She is alert and oriented to person, place, and time.  Psychiatric:        Mood and Affect: Mood normal.        Behavior: Behavior normal.     Diabetic Foot Exam - Simple   No data filed      Lab Results  Component Value Date   WBC 7.7 02/16/2021   HGB 12.6 02/16/2021   HCT 37.8 02/16/2021   PLT 232 02/16/2021   GLUCOSE 94 02/16/2021   CHOL 188 02/16/2021   TRIG 89 02/16/2021   HDL 63 02/16/2021   LDLCALC 109 (H) 02/16/2021   ALT 17 02/16/2021   AST 20 02/16/2021   NA 145 (H) 02/16/2021   K 5.0 02/16/2021   CL 106 02/16/2021   CREATININE 1.13 (H) 02/16/2021   BUN 21 02/16/2021   CO2 23 02/16/2021   HGBA1C 5.9 (H) 02/16/2021      Assessment & Plan:   Problem List Items Addressed This Visit       Digestive   GERD (gastroesophageal reflux disease)     Other   Hyperlipidemia   Relevant Orders   Lipid panel   Prediabetes   Relevant Orders   Hemoglobin A1c   Mild recurrent major depression (HCC)   RLS (restless legs syndrome)   Other Visit Diagnoses     Combined hyperlipidemia  associated with type 2 diabetes mellitus (HCC)    -  Primary   Essential hypertension       Relevant Orders   CBC With Diff/Platelet   Comprehensive metabolic panel   OSA on CPAP         .  No orders of the defined types were placed in this encounter.   Orders Placed This Encounter  Procedures   CBC With Diff/Platelet   Comprehensive metabolic panel   Lipid panel   Hemoglobin A1c     Follow-up: Return in about 6 months (around 02/18/2022) for chronic fasting.  An After Visit Summary was printed and given to the patient.  I,Marla I Leal-Borjas,acting as a scribe for Blane Ohara, MD.,have documented all relevant documentation on the behalf of Blane Ohara, MD,as directed by  Blane Ohara, MD while in the presence of Blane Ohara, MD.   I,Sohana Austell,acting as a scribe for Blane Ohara, MD.,have documented all relevant documentation on the behalf of Blane Ohara, MD,as directed by  Blane Ohara, MD while in the presence of Blane Ohara, MD.    Blane Ohara, MD [facility] [phone]

## 2021-08-19 ENCOUNTER — Other Ambulatory Visit: Payer: Self-pay | Admitting: Family Medicine

## 2021-08-19 DIAGNOSIS — E782 Mixed hyperlipidemia: Secondary | ICD-10-CM

## 2021-08-19 DIAGNOSIS — F330 Major depressive disorder, recurrent, mild: Secondary | ICD-10-CM

## 2021-08-19 LAB — COMPREHENSIVE METABOLIC PANEL
ALT: 18 IU/L (ref 0–32)
AST: 18 IU/L (ref 0–40)
Albumin/Globulin Ratio: 1.9 (ref 1.2–2.2)
Albumin: 4.4 g/dL (ref 3.8–4.8)
Alkaline Phosphatase: 65 IU/L (ref 44–121)
BUN/Creatinine Ratio: 13 (ref 12–28)
BUN: 14 mg/dL (ref 8–27)
Bilirubin Total: 0.4 mg/dL (ref 0.0–1.2)
CO2: 23 mmol/L (ref 20–29)
Calcium: 9.4 mg/dL (ref 8.7–10.3)
Chloride: 103 mmol/L (ref 96–106)
Creatinine, Ser: 1.1 mg/dL — ABNORMAL HIGH (ref 0.57–1.00)
Globulin, Total: 2.3 g/dL (ref 1.5–4.5)
Glucose: 97 mg/dL (ref 70–99)
Potassium: 5 mmol/L (ref 3.5–5.2)
Sodium: 140 mmol/L (ref 134–144)
Total Protein: 6.7 g/dL (ref 6.0–8.5)
eGFR: 52 mL/min/{1.73_m2} — ABNORMAL LOW (ref >59)

## 2021-08-19 LAB — LIPID PANEL
Chol/HDL Ratio: 3 ratio (ref 0.0–4.4)
Cholesterol, Total: 173 mg/dL (ref 100–199)
HDL: 58 mg/dL (ref >39)
LDL Chol Calc (NIH): 94 mg/dL (ref 0–99)
Triglycerides: 118 mg/dL (ref 0–149)
VLDL Cholesterol Cal: 21 mg/dL (ref 5–40)

## 2021-08-19 LAB — CBC WITH DIFF/PLATELET
Basophils Absolute: 0.1 10*3/uL (ref 0.0–0.2)
Basos: 1 %
EOS (ABSOLUTE): 0.2 10*3/uL (ref 0.0–0.4)
Eos: 3 %
Hematocrit: 38.3 % (ref 34.0–46.6)
Hemoglobin: 12.7 g/dL (ref 11.1–15.9)
Immature Grans (Abs): 0 10*3/uL (ref 0.0–0.1)
Immature Granulocytes: 0 %
Lymphocytes Absolute: 2 10*3/uL (ref 0.7–3.1)
Lymphs: 29 %
MCH: 29.7 pg (ref 26.6–33.0)
MCHC: 33.2 g/dL (ref 31.5–35.7)
MCV: 90 fL (ref 79–97)
Monocytes Absolute: 0.8 10*3/uL (ref 0.1–0.9)
Monocytes: 11 %
Neutrophils Absolute: 3.6 10*3/uL (ref 1.4–7.0)
Neutrophils: 56 %
Platelets: 217 10*3/uL (ref 150–450)
RBC: 4.27 x10E6/uL (ref 3.77–5.28)
RDW: 14.1 % (ref 11.7–15.4)
WBC: 6.7 10*3/uL (ref 3.4–10.8)

## 2021-08-19 LAB — HEMOGLOBIN A1C
Est. average glucose Bld gHb Est-mCnc: 126 mg/dL
Hgb A1c MFr Bld: 6 % — ABNORMAL HIGH (ref 4.8–5.6)

## 2021-08-19 NOTE — Assessment & Plan Note (Signed)
The current medical regimen is effective;  continue present plan and medications.  Lansoprazole 30 mg  daily, Famotidine 40 mg at bedtime

## 2021-08-19 NOTE — Assessment & Plan Note (Signed)
Hemoglobin A1c 6.0%, 3 month avg of blood sugars, is in prediabetic range.  In order to prevent progression to diabetes, recommend low carb diet and regular exercise 

## 2021-08-19 NOTE — Assessment & Plan Note (Signed)
The current medical regimen is effective;  continue present plan and medications.  

## 2021-08-19 NOTE — Assessment & Plan Note (Signed)
Well controlled.  No changes to medicines. currently taking simvastatin 40 mg daily Continue to work on eating a healthy diet and exercise.  Labs drawn today.  

## 2021-08-21 ENCOUNTER — Other Ambulatory Visit: Payer: Self-pay

## 2021-08-21 DIAGNOSIS — F330 Major depressive disorder, recurrent, mild: Secondary | ICD-10-CM

## 2021-08-21 DIAGNOSIS — G4709 Other insomnia: Secondary | ICD-10-CM

## 2021-08-21 MED ORDER — TRAZODONE HCL 50 MG PO TABS: 25.0000 mg | ORAL_TABLET | Freq: Every evening | ORAL | 1 refills | Status: DC | PRN

## 2021-08-21 MED ORDER — SIMVASTATIN 40 MG PO TABS: 40.0000 mg | ORAL_TABLET | Freq: Every day | ORAL | 0 refills | Status: DC

## 2021-08-21 MED ORDER — CITALOPRAM HYDROBROMIDE 40 MG PO TABS: 40.0000 mg | ORAL_TABLET | Freq: Every day | ORAL | 0 refills | Status: DC

## 2021-08-21 MED ORDER — BUPROPION HCL ER (SR) 150 MG PO TB12: 150.0000 mg | ORAL_TABLET | Freq: Two times a day (BID) | ORAL | 1 refills | Status: DC

## 2021-08-21 NOTE — Assessment & Plan Note (Signed)
Recommend continue to work on eating healthy diet and exercise.  

## 2021-08-21 NOTE — Assessment & Plan Note (Signed)
Continue cpap.  

## 2021-09-05 DIAGNOSIS — R221 Localized swelling, mass and lump, neck: Secondary | ICD-10-CM | POA: Diagnosis not present

## 2021-09-05 DIAGNOSIS — R2242 Localized swelling, mass and lump, left lower limb: Secondary | ICD-10-CM | POA: Diagnosis not present

## 2021-09-06 DIAGNOSIS — L942 Calcinosis cutis: Secondary | ICD-10-CM | POA: Diagnosis not present

## 2021-09-11 DIAGNOSIS — H26491 Other secondary cataract, right eye: Secondary | ICD-10-CM | POA: Diagnosis not present

## 2021-09-11 DIAGNOSIS — H43813 Vitreous degeneration, bilateral: Secondary | ICD-10-CM | POA: Diagnosis not present

## 2021-09-11 DIAGNOSIS — H2512 Age-related nuclear cataract, left eye: Secondary | ICD-10-CM | POA: Diagnosis not present

## 2021-09-11 DIAGNOSIS — S0231XS Fracture of orbital floor, right side, sequela: Secondary | ICD-10-CM | POA: Diagnosis not present

## 2021-09-11 DIAGNOSIS — Z961 Presence of intraocular lens: Secondary | ICD-10-CM | POA: Diagnosis not present

## 2021-09-11 LAB — HM DIABETES EYE EXAM

## 2021-09-13 ENCOUNTER — Other Ambulatory Visit: Payer: Self-pay

## 2021-09-13 DIAGNOSIS — H26491 Other secondary cataract, right eye: Secondary | ICD-10-CM | POA: Diagnosis not present

## 2021-09-13 MED ORDER — PRAMIPEXOLE DIHYDROCHLORIDE 0.5 MG PO TABS: 0.5000 mg | ORAL_TABLET | Freq: Three times a day (TID) | ORAL | 1 refills | Status: DC

## 2021-09-14 DIAGNOSIS — G4733 Obstructive sleep apnea (adult) (pediatric): Secondary | ICD-10-CM | POA: Diagnosis not present

## 2021-09-20 DIAGNOSIS — M76829 Posterior tibial tendinitis, unspecified leg: Secondary | ICD-10-CM | POA: Diagnosis not present

## 2021-09-25 DIAGNOSIS — M76891 Other specified enthesopathies of right lower limb, excluding foot: Secondary | ICD-10-CM | POA: Diagnosis not present

## 2021-09-25 DIAGNOSIS — M216X1 Other acquired deformities of right foot: Secondary | ICD-10-CM | POA: Diagnosis not present

## 2021-09-25 DIAGNOSIS — M25571 Pain in right ankle and joints of right foot: Secondary | ICD-10-CM | POA: Diagnosis not present

## 2021-09-25 DIAGNOSIS — G8918 Other acute postprocedural pain: Secondary | ICD-10-CM | POA: Diagnosis not present

## 2021-09-25 HISTORY — PX: FLAT FOOT RECONSTRUCTION-TAL GASTROC RECESSION: SHX6620

## 2021-10-04 DIAGNOSIS — M76821 Posterior tibial tendinitis, right leg: Secondary | ICD-10-CM | POA: Diagnosis not present

## 2021-10-11 DIAGNOSIS — Z981 Arthrodesis status: Secondary | ICD-10-CM | POA: Diagnosis not present

## 2021-10-11 DIAGNOSIS — M79671 Pain in right foot: Secondary | ICD-10-CM | POA: Diagnosis not present

## 2021-11-08 DIAGNOSIS — M7989 Other specified soft tissue disorders: Secondary | ICD-10-CM | POA: Diagnosis not present

## 2021-11-08 DIAGNOSIS — Z9889 Other specified postprocedural states: Secondary | ICD-10-CM | POA: Diagnosis not present

## 2021-11-21 ENCOUNTER — Other Ambulatory Visit: Payer: Self-pay | Admitting: Family Medicine

## 2021-11-21 ENCOUNTER — Other Ambulatory Visit: Payer: Self-pay | Admitting: Physician Assistant

## 2021-11-21 DIAGNOSIS — G4709 Other insomnia: Secondary | ICD-10-CM

## 2021-11-21 DIAGNOSIS — F330 Major depressive disorder, recurrent, mild: Secondary | ICD-10-CM

## 2021-12-06 DIAGNOSIS — M722 Plantar fascial fibromatosis: Secondary | ICD-10-CM | POA: Diagnosis not present

## 2021-12-06 DIAGNOSIS — M76821 Posterior tibial tendinitis, right leg: Secondary | ICD-10-CM | POA: Diagnosis not present

## 2021-12-06 DIAGNOSIS — Z4789 Encounter for other orthopedic aftercare: Secondary | ICD-10-CM | POA: Diagnosis not present

## 2021-12-15 DIAGNOSIS — G4733 Obstructive sleep apnea (adult) (pediatric): Secondary | ICD-10-CM | POA: Diagnosis not present

## 2021-12-21 ENCOUNTER — Telehealth: Payer: Self-pay

## 2021-12-21 NOTE — Telephone Encounter (Signed)
Patient had procedure on right foot by Dr Domingo Sep.  She needs an xray for the foot after procedure.  She will do a video with provider. Does she need to come in or send an order to [facility]? Please advise

## 2021-12-22 NOTE — Telephone Encounter (Signed)
Spoke with Patient and she stated that her Dr form [place] wanted to know where he could send the order his self. I gave her the information for [facility], she will called and give information to provider so he can send order to them for x-ray.  If he can't send order, Dr. Sedalia Muta will be happy to sent order she just needs to know what he wants done.  Patient Made Aware, Verbalized Understanding.

## 2022-01-09 ENCOUNTER — Ambulatory Visit (INDEPENDENT_AMBULATORY_CARE_PROVIDER_SITE_OTHER): Payer: Medicare Other

## 2022-01-09 DIAGNOSIS — Z23 Encounter for immunization: Secondary | ICD-10-CM

## 2022-01-11 ENCOUNTER — Other Ambulatory Visit (HOSPITAL_BASED_OUTPATIENT_CLINIC_OR_DEPARTMENT_OTHER): Payer: Self-pay | Admitting: Podiatrist

## 2022-01-11 DIAGNOSIS — M79671 Pain in right foot: Secondary | ICD-10-CM

## 2022-01-12 ENCOUNTER — Ambulatory Visit (HOSPITAL_BASED_OUTPATIENT_CLINIC_OR_DEPARTMENT_OTHER)
Admission: RE | Admit: 2022-01-12 | Discharge: 2022-01-12 | Disposition: A | Discharge status: Home / Self Care | Payer: Medicare Other | Source: Ambulatory Visit | Attending: Podiatrist | Admitting: Podiatrist

## 2022-01-12 DIAGNOSIS — M79671 Pain in right foot: Secondary | ICD-10-CM | POA: Insufficient documentation

## 2022-01-12 DIAGNOSIS — M7989 Other specified soft tissue disorders: Secondary | ICD-10-CM | POA: Diagnosis not present

## 2022-02-09 DIAGNOSIS — M25671 Stiffness of right ankle, not elsewhere classified: Secondary | ICD-10-CM | POA: Diagnosis not present

## 2022-02-09 DIAGNOSIS — R2689 Other abnormalities of gait and mobility: Secondary | ICD-10-CM | POA: Diagnosis not present

## 2022-02-09 DIAGNOSIS — M25571 Pain in right ankle and joints of right foot: Secondary | ICD-10-CM | POA: Diagnosis not present

## 2022-02-12 ENCOUNTER — Other Ambulatory Visit: Payer: Self-pay | Admitting: Family Medicine

## 2022-02-12 DIAGNOSIS — E782 Mixed hyperlipidemia: Secondary | ICD-10-CM

## 2022-02-16 DIAGNOSIS — M25571 Pain in right ankle and joints of right foot: Secondary | ICD-10-CM | POA: Diagnosis not present

## 2022-02-16 DIAGNOSIS — M25671 Stiffness of right ankle, not elsewhere classified: Secondary | ICD-10-CM | POA: Diagnosis not present

## 2022-02-16 DIAGNOSIS — R2689 Other abnormalities of gait and mobility: Secondary | ICD-10-CM | POA: Diagnosis not present

## 2022-02-19 NOTE — Progress Notes (Deleted)
Duplicate

## 2022-02-20 ENCOUNTER — Encounter: Payer: Medicare Other | Admitting: Family Medicine

## 2022-02-20 DIAGNOSIS — R7303 Prediabetes: Secondary | ICD-10-CM

## 2022-02-20 DIAGNOSIS — K219 Gastro-esophageal reflux disease without esophagitis: Secondary | ICD-10-CM

## 2022-02-20 DIAGNOSIS — E782 Mixed hyperlipidemia: Secondary | ICD-10-CM

## 2022-02-21 DIAGNOSIS — M25671 Stiffness of right ankle, not elsewhere classified: Secondary | ICD-10-CM | POA: Diagnosis not present

## 2022-02-21 DIAGNOSIS — R2689 Other abnormalities of gait and mobility: Secondary | ICD-10-CM | POA: Diagnosis not present

## 2022-02-21 DIAGNOSIS — M25571 Pain in right ankle and joints of right foot: Secondary | ICD-10-CM | POA: Diagnosis not present

## 2022-02-23 DIAGNOSIS — R2689 Other abnormalities of gait and mobility: Secondary | ICD-10-CM | POA: Diagnosis not present

## 2022-02-23 DIAGNOSIS — M25671 Stiffness of right ankle, not elsewhere classified: Secondary | ICD-10-CM | POA: Diagnosis not present

## 2022-02-23 DIAGNOSIS — M25571 Pain in right ankle and joints of right foot: Secondary | ICD-10-CM | POA: Diagnosis not present

## 2022-02-26 NOTE — Progress Notes (Signed)
This encounter was created in error - please disregard.

## 2022-02-27 ENCOUNTER — Other Ambulatory Visit: Payer: Self-pay | Admitting: Family Medicine

## 2022-02-27 DIAGNOSIS — F330 Major depressive disorder, recurrent, mild: Secondary | ICD-10-CM

## 2022-02-27 DIAGNOSIS — M25671 Stiffness of right ankle, not elsewhere classified: Secondary | ICD-10-CM | POA: Diagnosis not present

## 2022-02-27 DIAGNOSIS — R2689 Other abnormalities of gait and mobility: Secondary | ICD-10-CM | POA: Diagnosis not present

## 2022-02-27 DIAGNOSIS — M25571 Pain in right ankle and joints of right foot: Secondary | ICD-10-CM | POA: Diagnosis not present

## 2022-03-01 ENCOUNTER — Other Ambulatory Visit: Payer: Self-pay | Admitting: Family Medicine

## 2022-03-02 DIAGNOSIS — M25671 Stiffness of right ankle, not elsewhere classified: Secondary | ICD-10-CM | POA: Diagnosis not present

## 2022-03-02 DIAGNOSIS — R2689 Other abnormalities of gait and mobility: Secondary | ICD-10-CM | POA: Diagnosis not present

## 2022-03-02 DIAGNOSIS — M25571 Pain in right ankle and joints of right foot: Secondary | ICD-10-CM | POA: Diagnosis not present

## 2022-03-09 DIAGNOSIS — R2689 Other abnormalities of gait and mobility: Secondary | ICD-10-CM | POA: Diagnosis not present

## 2022-03-09 DIAGNOSIS — M25671 Stiffness of right ankle, not elsewhere classified: Secondary | ICD-10-CM | POA: Diagnosis not present

## 2022-03-09 DIAGNOSIS — M25571 Pain in right ankle and joints of right foot: Secondary | ICD-10-CM | POA: Diagnosis not present

## 2022-03-15 DIAGNOSIS — R2689 Other abnormalities of gait and mobility: Secondary | ICD-10-CM | POA: Diagnosis not present

## 2022-03-15 DIAGNOSIS — M25671 Stiffness of right ankle, not elsewhere classified: Secondary | ICD-10-CM | POA: Diagnosis not present

## 2022-03-15 DIAGNOSIS — M25571 Pain in right ankle and joints of right foot: Secondary | ICD-10-CM | POA: Diagnosis not present

## 2022-03-16 ENCOUNTER — Ambulatory Visit (INDEPENDENT_AMBULATORY_CARE_PROVIDER_SITE_OTHER): Payer: Medicare Other | Admitting: Family Medicine

## 2022-03-16 ENCOUNTER — Encounter: Payer: Self-pay | Admitting: Family Medicine

## 2022-03-16 VITALS — BP 132/62 | HR 78 | Temp 97.3°F | Ht 65.0 in | Wt 207.0 lb

## 2022-03-16 DIAGNOSIS — F330 Major depressive disorder, recurrent, mild: Secondary | ICD-10-CM | POA: Diagnosis not present

## 2022-03-16 DIAGNOSIS — Z23 Encounter for immunization: Secondary | ICD-10-CM

## 2022-03-16 DIAGNOSIS — G2581 Restless legs syndrome: Secondary | ICD-10-CM | POA: Diagnosis not present

## 2022-03-16 DIAGNOSIS — Z780 Asymptomatic menopausal state: Secondary | ICD-10-CM | POA: Diagnosis not present

## 2022-03-16 DIAGNOSIS — I10 Essential (primary) hypertension: Secondary | ICD-10-CM | POA: Diagnosis not present

## 2022-03-16 DIAGNOSIS — E782 Mixed hyperlipidemia: Secondary | ICD-10-CM | POA: Diagnosis not present

## 2022-03-16 DIAGNOSIS — E1159 Type 2 diabetes mellitus with other circulatory complications: Secondary | ICD-10-CM | POA: Diagnosis not present

## 2022-03-16 DIAGNOSIS — R7309 Other abnormal glucose: Secondary | ICD-10-CM | POA: Diagnosis not present

## 2022-03-16 DIAGNOSIS — K219 Gastro-esophageal reflux disease without esophagitis: Secondary | ICD-10-CM | POA: Diagnosis not present

## 2022-03-16 DIAGNOSIS — Z13820 Encounter for screening for osteoporosis: Secondary | ICD-10-CM

## 2022-03-16 DIAGNOSIS — G4733 Obstructive sleep apnea (adult) (pediatric): Secondary | ICD-10-CM

## 2022-03-16 DIAGNOSIS — I152 Hypertension secondary to endocrine disorders: Secondary | ICD-10-CM

## 2022-03-16 NOTE — Assessment & Plan Note (Signed)
The current medical regimen is effective;  continue present plan and medications. Continue mirapex 0.5 mg one three times a day.

## 2022-03-16 NOTE — Assessment & Plan Note (Signed)
Well controlled.  No changes to medicines. currently taking simvastatin 40 mg daily Continue to work on eating a healthy diet and exercise.  Labs drawn today.

## 2022-03-16 NOTE — Assessment & Plan Note (Signed)
The current medical regimen is effective;  continue present plan and medications. Continue wellbutrin 150 mg twice daily and celexa 40 mg daily

## 2022-03-16 NOTE — Assessment & Plan Note (Signed)
The current medical regimen is effective;  continue present plan and medications. Continue Lansoprazole 30 mg  daily, Famotidine 40 mg at bedtime

## 2022-03-16 NOTE — Assessment & Plan Note (Signed)
Diabetes and hypertension well controlled.  No changes to medicines.  Continue to work on eating a healthy diet and exercise.  Labs drawn today.

## 2022-03-16 NOTE — Progress Notes (Signed)
Subjective:  Patient ID: Connie Marks, female    DOB: [birth year]  Age: 77 y.o. MRN: [id number]  Chief Complaint  Patient presents with   Hyperlipidemia   Hypertension    Prediabetes: Patient normally eats healthy.  Unable to exercise due to pain on her right foot and ankle. She does not check her blood sugar. Foot Surgery was done in [place] on 09/25/2021. Doing very well. Is going to physical therapy   Hyperlipidemia: She is currently taking simvastatin 40 mg daily. GERD: Takes Lansoprazole 30 mg  daily, Famotidine 40 mg at bedtime. Depression: Patient is taking Wellbutrin 150 mg BID, Celexa 40 mg daily. OSA: on CPAP        03/16/2022   10:43 AM 08/18/2021    8:27 AM 02/16/2021    8:19 AM 11/10/2020    8:33 AM 08/18/2020   11:29 AM  Depression screen PHQ 2/9  Decreased Interest 0 0 0 0 0  Down, Depressed, Hopeless 0 0 1 0 0  PHQ - 2 Score 0 0 1 0 0  Altered sleeping 0 0 0  0  Tired, decreased energy 1 1 0  0  Change in appetite 0 0 0  0  Feeling bad or failure about yourself  0 0 0  0  Trouble concentrating 0 0 0  0  Moving slowly or fidgety/restless 0 0 0  0  Suicidal thoughts 0 0 0  0  PHQ-9 Score 1 1 1  $ 0  Difficult doing work/chores Not difficult at all Not difficult at all Not difficult at all  Not difficult at all         04/29/2020    8:25 AM 08/03/2020    8:31 AM 02/16/2021    8:50 AM 03/14/2021   11:20 AM 03/16/2022   10:44 AM  Fall Risk  Falls in the past year? 1 0 1 1 0  Was there an injury with Fall? 0 0 0 0 0  Fall Risk Category Calculator 1 0 2 2 0  Fall Risk Category (Retired) Low Low Moderate Moderate   (RETIRED) Patient Fall Risk Level Low fall risk Low fall risk  Moderate fall risk   Patient at Risk for Falls Due to Impaired vision  History of fall(s) History of fall(s);Impaired balance/gait;Orthopedic patient No Fall Risks  Fall risk Follow up Falls evaluation completed Falls evaluation completed Falls evaluation completed;Falls prevention discussed  Falls evaluation completed;Falls prevention discussed Falls evaluation completed      Review of Systems  Constitutional:  Negative for appetite change, fatigue and fever.  HENT:  Negative for congestion, ear pain, sinus pressure and sore throat.   Respiratory:  Negative for cough, chest tightness, shortness of breath and wheezing.   Cardiovascular:  Negative for chest pain and palpitations.  Gastrointestinal:  Negative for abdominal pain, constipation, diarrhea, nausea and vomiting.  Genitourinary:  Negative for dysuria and hematuria.  Musculoskeletal:  Negative for arthralgias, back pain, joint swelling and myalgias.  Skin:  Negative for rash.  Neurological:  Negative for dizziness, weakness and headaches.  Psychiatric/Behavioral:  Negative for dysphoric mood. The patient is not nervous/anxious.     Current Outpatient Medications on File Prior to Visit  Medication Sig Dispense Refill   aspirin EC 81 MG tablet Take 81 mg by mouth daily. Swallow whole.     buPROPion (WELLBUTRIN SR) 150 MG 12 hr tablet TAKE ONE TABLET BY MOUTH TWICE DAILY 180 tablet 1   Calcium Carbonate+Vitamin D 600-200 MG-UNIT TABS Take 1  tablet by mouth daily.     citalopram (CELEXA) 40 MG tablet TAKE ONE TABLET BY MOUTH DAILY 90 tablet 0   famotidine (PEPCID) 40 MG tablet Take 40 mg by mouth at bedtime.     lansoprazole (PREVACID) 30 MG capsule Take 1 capsule (30 mg total) by mouth daily at 12 noon. 90 capsule 3   Multiple Vitamins-Minerals (MULTIVITAL PO) Take by mouth.     Polyethyl Glycol-Propyl Glycol (SYSTANE) 0.4-0.3 % SOLN Apply 1 drop to eye daily as needed.     pramipexole (MIRAPEX) 0.5 MG tablet TAKE ONE TABLET BY MOUTH THREE TIMES DAILY 90 tablet 1   simvastatin (ZOCOR) 40 MG tablet TAKE ONE TABLET BY MOUTH ONCE DAILY 90 tablet 0   traZODone (DESYREL) 50 MG tablet TAKE 1/2 TO 1 TABLET BY MOUTH AT BEDTIME AS NEEDED FOR SLEEP 90 tablet 1   valACYclovir (VALTREX) 1000 MG tablet Take 2 tablets (2,000 mg total)  by mouth 2 (two) times daily at onset 24 tablet 0   No current facility-administered medications on file prior to visit.   Past Medical History:  Diagnosis Date   Anxiety    Depression    GERD (gastroesophageal reflux disease)    Hyperlipidemia    Prediabetes    Sleep apnea    uses CPAP every night   Past Surgical History:  Procedure Laterality Date   CESAREAN SECTION     DIAGNOSTIC LAPAROSCOPY     HERNIA REPAIR     JOINT REPLACEMENT     knee replacements bilat   JOINT REPLACEMENT     hip replacements bilat   ORIF ORBITAL FRACTURE Right 12/19/2018   Procedure: OPEN TREATMENT RIGHT ORBITAL FLOOR WITH IMPLANT, PERIORBITAL APPROACH;  Surgeon: Irene Limbo, MD;  Location: [facility];  Service: Plastics;  Laterality: Right;   ORIF ORBITAL FRACTURE Right 02/03/2019   Procedure: OPEN TREATMENT RIGHT ORBITAL FLOOR FRACTURE REVISION, PERI ORBITAL APPROACH;  Surgeon: Irene Limbo, MD;  Location: [facility];  Service: Plastics;  Laterality: Right;    Family History  Problem Relation Age of Onset   Diabetes Father    COPD Father    Pancreatic cancer Maternal Grandmother    Breast cancer Neg Hx    Social History   Socioeconomic History   Marital status: Divorced    Spouse name: Not on file   Number of children: 2   Years of education: Not on file   Highest education level: Not on file  Occupational History   Not on file  Tobacco Use   Smoking status: Former    Packs/day: 1.00    Years: 15.00    Total pack years: 15.00    Types: Cigarettes    Quit date: 12/18/1985    Years since quitting: 36.2   Smokeless tobacco: Never  Vaping Use   Vaping Use: Never used  Substance and Sexual Activity   Alcohol use: Yes    Alcohol/week: 3.0 standard drinks of alcohol    Types: 2 Glasses of wine, 1 Shots of liquor per week    Comment: 2 glasses /week   Drug use: Never   Sexual activity: Not Currently  Other Topics Concern   Not on file   Social History Narrative   Daughter #1 - lives in [place], Daughter #2 - lives in [place], mother lives in [place] Determinants of Health   Financial Resource Strain: [place]  (01/06/2021)   Overall Financial Resource Strain ([place])  Difficulty of Paying Living Expenses: Not hard at all  Food Insecurity: No Food Insecurity (03/14/2021)   Hunger Vital Sign    Worried About Running Out of Food in the Last Year: Never true    Ran Out of Food in the Last Year: Never true  Transportation Needs: No Transportation Needs (03/14/2021)   PRAPARE - Hydrologist (Medical): No    Lack of Transportation (Non-Medical): No  Physical Activity: Not on file  Stress: Not on file  Social Connections: Unknown (03/14/2021)   Social Connection and Isolation Panel [NHANES]    Frequency of Communication with Friends and Family: More than three times a week    Frequency of Social Gatherings with Friends and Family: More than three times a week    Attends Religious Services: Not on Advertising copywriter or Organizations: Yes    Attends Music therapist: More than 4 times per year    Marital Status: Divorced    Objective:  BP 132/62 (BP Location: Left Arm, Patient Position: Sitting)   Pulse 78   Temp (!) 97.3 F (36.3 C) (Temporal)   Ht 5' 5"$  (1.651 m)   Wt 207 lb (93.9 kg)   SpO2 98%   BMI 34.45 kg/m      03/16/2022   10:49 AM 08/18/2021    8:11 AM 07/18/2021    2:23 PM  BP/Weight  Systolic BP [id number]  Diastolic BP 62 78 70  Wt. (Lbs) 207 199 199  BMI 34.45 kg/m2 33.12 kg/m2 33.12 kg/m2    Physical Exam Vitals reviewed.  Constitutional:      Appearance: Normal appearance. She is obese.  Cardiovascular:     Rate and Rhythm: Normal rate and regular rhythm.     Heart sounds: Normal heart sounds.  Pulmonary:     Effort: Pulmonary effort is normal.     Breath sounds: Normal breath sounds.  Abdominal:     General: Abdomen is flat.  Bowel sounds are normal.     Palpations: Abdomen is soft.     Tenderness: There is no abdominal tenderness.  Musculoskeletal:        General: Deformity (rt foot. healing well. Decreased ROM due to pins.) present.  Neurological:     Mental Status: She is alert and oriented to person, place, and time.  Psychiatric:        Mood and Affect: Mood normal.        Behavior: Behavior normal.     Diabetic Foot Exam - Simple   No data filed      Lab Results  Component Value Date   WBC 6.7 08/18/2021   HGB 12.7 08/18/2021   HCT 38.3 08/18/2021   PLT 217 08/18/2021   GLUCOSE 97 08/18/2021   CHOL 173 08/18/2021   TRIG 118 08/18/2021   HDL 58 08/18/2021   LDLCALC 94 08/18/2021   ALT 18 08/18/2021   AST 18 08/18/2021   NA 140 08/18/2021   K 5.0 08/18/2021   CL 103 08/18/2021   CREATININE 1.10 (H) 08/18/2021   BUN 14 08/18/2021   CO2 23 08/18/2021   HGBA1C 6.0 (H) 08/18/2021      Assessment & Plan:    Mixed hyperlipidemia Assessment & Plan: Well controlled.  No changes to medicines. currently taking simvastatin 40 mg daily Continue to work on eating a healthy diet and exercise.  Labs drawn today.   Orders: -  Lipid panel  Mild recurrent major depression (HCC) Assessment & Plan: The current medical regimen is effective;  continue present plan and medications. Continue wellbutrin 150 mg twice daily and celexa 40 mg daily     Hypertension associated with diabetes ([place]) Assessment & Plan: Diabetes and hypertension well controlled.  No changes to medicines.  Continue to work on eating a healthy diet and exercise.  Labs drawn today.    Orders: -     CBC With Diff/Platelet -     Comprehensive metabolic panel  Gastroesophageal reflux disease without esophagitis Assessment & Plan: The current medical regimen is effective;  continue present plan and medications. Continue Lansoprazole 30 mg  daily, Famotidine 40 mg at bedtime    RLS (restless legs  syndrome) Assessment & Plan: The current medical regimen is effective;  continue present plan and medications. Continue mirapex 0.5 mg one three times a day.    OSA on CPAP Assessment & Plan: Continue CPAP   Encounter for osteoporosis screening in asymptomatic postmenopausal patient -     DG Bone Density; Future  Need for vaccination -     Flu Vaccine QUAD High Dose(Fluad) -     Pneumococcal conjugate vaccine 20-valent  Hypertension complicating diabetes ([place]) Assessment & Plan: Diabetes and hypertension well controlled.  No changes to medicines.  Continue to work on eating a healthy diet and exercise.  Labs drawn today.        No orders of the defined types were placed in this encounter.   Orders Placed This Encounter  Procedures   DG Bone Density   Flu Vaccine QUAD High Dose(Fluad)   Pneumococcal conjugate vaccine 20-valent   CBC With Diff/Platelet   Comprehensive metabolic panel   Lipid panel     Follow-up: Return in about 6 months (around 09/14/2022) for chronic fasting, awv in March 2024.Marland Kitchen  An After Visit Summary was printed and given to the patient.   I,Marla I Leal-Borjas,acting as a scribe for Rochel Brome, MD.,have documented all relevant documentation on the behalf of Rochel Brome, MD,as directed by  Rochel Brome, MD while in the presence of Rochel Brome, MD.   Alvina Filbert Peterson [place] as a scribe for Rochel Brome, MD.,have documented all relevant documentation on the behalf of Rochel Brome, MD,as directed by  Rochel Brome, MD while in the presence of Rochel Brome, MD.   I attest that I have reviewed this visit and agree with the plan scribed by my staff.  Rochel Brome, MD [facility] [phone]

## 2022-03-16 NOTE — Assessment & Plan Note (Signed)
Continue CPAP.  

## 2022-03-17 DIAGNOSIS — G4733 Obstructive sleep apnea (adult) (pediatric): Secondary | ICD-10-CM | POA: Diagnosis not present

## 2022-03-17 LAB — CBC WITH DIFF/PLATELET
Basophils Absolute: 0.1 10*3/uL (ref 0.0–0.2)
Basos: 1 %
EOS (ABSOLUTE): 0.2 10*3/uL (ref 0.0–0.4)
Eos: 2 %
Hematocrit: 35.4 % (ref 34.0–46.6)
Hemoglobin: 11.4 g/dL (ref 11.1–15.9)
Immature Grans (Abs): 0.1 10*3/uL (ref 0.0–0.1)
Immature Granulocytes: 1 %
Lymphocytes Absolute: 1.6 10*3/uL (ref 0.7–3.1)
Lymphs: 23 %
MCH: 26.7 pg (ref 26.6–33.0)
MCHC: 32.2 g/dL (ref 31.5–35.7)
MCV: 83 fL (ref 79–97)
Monocytes Absolute: 0.6 10*3/uL (ref 0.1–0.9)
Monocytes: 9 %
Neutrophils Absolute: 4.7 10*3/uL (ref 1.4–7.0)
Neutrophils: 64 %
Platelets: 246 10*3/uL (ref 150–450)
RBC: 4.27 x10E6/uL (ref 3.77–5.28)
RDW: 14.1 % (ref 11.7–15.4)
WBC: 7.3 10*3/uL (ref 3.4–10.8)

## 2022-03-17 LAB — COMPREHENSIVE METABOLIC PANEL
ALT: 16 IU/L (ref 0–32)
AST: 20 IU/L (ref 0–40)
Albumin/Globulin Ratio: 2 (ref 1.2–2.2)
Albumin: 4.5 g/dL (ref 3.8–4.8)
Alkaline Phosphatase: 74 IU/L (ref 44–121)
BUN/Creatinine Ratio: 16 (ref 12–28)
BUN: 15 mg/dL (ref 8–27)
Bilirubin Total: 0.5 mg/dL (ref 0.0–1.2)
CO2: 20 mmol/L (ref 20–29)
Calcium: 9.6 mg/dL (ref 8.7–10.3)
Chloride: 106 mmol/L (ref 96–106)
Creatinine, Ser: 0.96 mg/dL (ref 0.57–1.00)
Globulin, Total: 2.3 g/dL (ref 1.5–4.5)
Glucose: 108 mg/dL — ABNORMAL HIGH (ref 70–99)
Potassium: 5.3 mmol/L — ABNORMAL HIGH (ref 3.5–5.2)
Sodium: 143 mmol/L (ref 134–144)
Total Protein: 6.8 g/dL (ref 6.0–8.5)
eGFR: 61 mL/min/{1.73_m2} (ref >59)

## 2022-03-17 LAB — CARDIOVASCULAR RISK ASSESSMENT

## 2022-03-17 LAB — LIPID PANEL
Chol/HDL Ratio: 3.1 ratio (ref 0.0–4.4)
Cholesterol, Total: 178 mg/dL (ref 100–199)
HDL: 57 mg/dL (ref >39)
LDL Chol Calc (NIH): 98 mg/dL (ref 0–99)
Triglycerides: 128 mg/dL (ref 0–149)
VLDL Cholesterol Cal: 23 mg/dL (ref 5–40)

## 2022-03-18 NOTE — Progress Notes (Signed)
Blood count normal.  Liver function normal.  Kidney function normal.  Sugar up. Add A1c.  Cholesterol: good.

## 2022-03-19 ENCOUNTER — Other Ambulatory Visit: Payer: Self-pay

## 2022-03-19 DIAGNOSIS — M25571 Pain in right ankle and joints of right foot: Secondary | ICD-10-CM | POA: Diagnosis not present

## 2022-03-19 DIAGNOSIS — R2689 Other abnormalities of gait and mobility: Secondary | ICD-10-CM | POA: Diagnosis not present

## 2022-03-19 DIAGNOSIS — M25671 Stiffness of right ankle, not elsewhere classified: Secondary | ICD-10-CM | POA: Diagnosis not present

## 2022-03-19 DIAGNOSIS — R7309 Other abnormal glucose: Secondary | ICD-10-CM

## 2022-03-19 LAB — HEMOGLOBIN A1C
Est. average glucose Bld gHb Est-mCnc: 128 mg/dL
Hgb A1c MFr Bld: 6.1 % — ABNORMAL HIGH (ref 4.8–5.6)

## 2022-03-19 LAB — SPECIMEN STATUS REPORT

## 2022-03-21 DIAGNOSIS — M79671 Pain in right foot: Secondary | ICD-10-CM | POA: Diagnosis not present

## 2022-03-21 DIAGNOSIS — M25571 Pain in right ankle and joints of right foot: Secondary | ICD-10-CM | POA: Diagnosis not present

## 2022-03-21 DIAGNOSIS — M76829 Posterior tibial tendinitis, unspecified leg: Secondary | ICD-10-CM | POA: Diagnosis not present

## 2022-03-29 DIAGNOSIS — R2689 Other abnormalities of gait and mobility: Secondary | ICD-10-CM | POA: Diagnosis not present

## 2022-03-29 DIAGNOSIS — M25561 Pain in right knee: Secondary | ICD-10-CM | POA: Diagnosis not present

## 2022-03-29 DIAGNOSIS — M25571 Pain in right ankle and joints of right foot: Secondary | ICD-10-CM | POA: Diagnosis not present

## 2022-04-05 DIAGNOSIS — R2689 Other abnormalities of gait and mobility: Secondary | ICD-10-CM | POA: Diagnosis not present

## 2022-04-05 DIAGNOSIS — M25571 Pain in right ankle and joints of right foot: Secondary | ICD-10-CM | POA: Diagnosis not present

## 2022-04-05 DIAGNOSIS — M25671 Stiffness of right ankle, not elsewhere classified: Secondary | ICD-10-CM | POA: Diagnosis not present

## 2022-04-10 DIAGNOSIS — R2689 Other abnormalities of gait and mobility: Secondary | ICD-10-CM | POA: Diagnosis not present

## 2022-04-10 DIAGNOSIS — M25571 Pain in right ankle and joints of right foot: Secondary | ICD-10-CM | POA: Diagnosis not present

## 2022-04-10 DIAGNOSIS — M25671 Stiffness of right ankle, not elsewhere classified: Secondary | ICD-10-CM | POA: Diagnosis not present

## 2022-04-11 ENCOUNTER — Ambulatory Visit (INDEPENDENT_AMBULATORY_CARE_PROVIDER_SITE_OTHER): Payer: Medicare Other

## 2022-04-11 DIAGNOSIS — Z0000 Encounter for general adult medical examination without abnormal findings: Secondary | ICD-10-CM | POA: Diagnosis not present

## 2022-04-11 NOTE — Progress Notes (Signed)
Subjective:   ADEN VREDEVELD is a 77 y.o. female who presents for Medicare Annual (Subsequent) preventive examination.  I connected with  [name] on 04/11/22 by a audio enabled telemedicine application and verified that I am speaking with the correct person using two identifiers.  Patient Location: Home  Provider Location: Office/Clinic  I discussed the limitations of evaluation and management by telemedicine. The patient expressed understanding and agreed to proceed.  Cardiac Risk Factors include: advanced age (>13mn, >>80women);diabetes mellitus;obesity (BMI >30kg/m2)     Objective:    There were no vitals filed for this visit. There is no height or weight on file to calculate BMI.     03/14/2021   11:19 AM 01/20/2020   10:11 AM 02/03/2019    8:49 AM 01/26/2019    2:07 PM 12/19/2018    6:48 AM 12/12/2018    2:07 PM  Advanced Directives  Does Patient Have a Medical Advance Directive? No No No No No No  Would patient like information on creating a medical advance directive? No - Patient declined  No - Patient declined  Yes (MAU/Ambulatory/Procedural Areas - Information given) No - Patient declined    Current Medications (verified) Outpatient Encounter Medications as of 04/11/2022  Medication Sig   aspirin EC 81 MG tablet Take 81 mg by mouth daily. Swallow whole.   buPROPion (WELLBUTRIN SR) 150 MG 12 hr tablet TAKE ONE TABLET BY MOUTH TWICE DAILY   Calcium Carbonate+Vitamin D 600-200 MG-UNIT TABS Take 1 tablet by mouth daily.   carboxymethylcellulose 1 % ophthalmic solution Apply 1 drop to eye 3 (three) times daily.   citalopram (CELEXA) 40 MG tablet TAKE ONE TABLET BY MOUTH DAILY   famotidine (PEPCID) 40 MG tablet Take 40 mg by mouth at bedtime.   lansoprazole (PREVACID) 30 MG capsule Take 1 capsule (30 mg total) by mouth daily at 12 noon.   Multiple Vitamins-Minerals (MULTIVITAL PO) Take by mouth.   pramipexole (MIRAPEX) 0.5 MG tablet TAKE ONE TABLET BY MOUTH  THREE TIMES DAILY   simvastatin (ZOCOR) 40 MG tablet TAKE ONE TABLET BY MOUTH ONCE DAILY   traZODone (DESYREL) 50 MG tablet TAKE 1/2 TO 1 TABLET BY MOUTH AT BEDTIME AS NEEDED FOR SLEEP   valACYclovir (VALTREX) 1000 MG tablet Take 2 tablets (2,000 mg total) by mouth 2 (two) times daily at onset   Polyethyl Glycol-Propyl Glycol (SYSTANE) 0.4-0.3 % SOLN Apply 1 drop to eye daily as needed. (Patient not taking: Reported on 04/11/2022)   No facility-administered encounter medications on file as of 04/11/2022.    Allergies (verified) Adhesive [tape]   History: Past Medical History:  Diagnosis Date   Anxiety    Depression    GERD (gastroesophageal reflux disease)    Hyperlipidemia    Prediabetes    Sleep apnea    uses CPAP every night   Past Surgical History:  Procedure Laterality Date   CESAREAN SECTION     DIAGNOSTIC LAPAROSCOPY     FLAT FOOT RECONSTRUCTION-TAL GASTROC RECESSION Right 09/25/2021   [place]    knee replacements bilat   JOINT REPLACEMENT     hip replacements bilat   ORIF ORBITAL FRACTURE Right 12/19/2018   Procedure: OPEN TREATMENT RIGHT ORBITAL FLOOR WITH IMPLANT, PERIORBITAL APPROACH;  Surgeon: TIrene Limbo MD;  [facility]  Service: Plastics;  Laterality: Right;   ORIF ORBITAL FRACTURE Right 02/03/2019   Procedure: OPEN TREATMENT  RIGHT ORBITAL FLOOR FRACTURE REVISION, PERI ORBITAL APPROACH;  Surgeon: Irene Limbo, MD;  Location: [facility];  Service: Plastics;  Laterality: Right;   Family History  Problem Relation Age of Onset   Diabetes Father    COPD Father    Pancreatic cancer Maternal Grandmother    Breast cancer Neg Hx    Social History   Socioeconomic History   Marital status: Divorced    Spouse name: Not on file   Number of children: 2   Years of education: Not on file   Highest education level: Not on file  Occupational History   Not on file  Tobacco  Use   Smoking status: Former    Packs/day: 1.00    Years: 15.00    Total pack years: 15.00    Types: Cigarettes    Quit date: 12/18/1985    Years since quitting: 36.3   Smokeless tobacco: Never  Vaping Use   Vaping Use: Never used  Substance and Sexual Activity   Alcohol use: Yes    Alcohol/week: 3.0 standard drinks of alcohol    Types: 2 Glasses of wine, 1 Shots of liquor per week    Comment: 2 glasses /week   Drug use: Never   Sexual activity: Not Currently  Other Topics Concern   Not on file  Social History Narrative   Daughter #1 - lives in [place], Daughter #2 - lives in [place], mother lives in [place] Strain: [place]  (04/11/2022)   Overall Financial Resource Strain ([place])    Difficulty of Paying Living Expenses: Not hard at all  Food Insecurity: No Food Insecurity (04/11/2022)   Hunger Vital Sign    Worried About Running Out of Food in the Last Year: Never true    [place] in the Last Year: Never true  Transportation Needs: No Transportation Needs (04/11/2022)   PRAPARE - Hydrologist (Medical): No    Lack of Transportation (Non-Medical): No  Physical Activity: Not on file  Stress: No Stress Concern Present (04/11/2022)   [place]    Feeling of Stress : Not at all  Social Connections: Unknown (03/14/2021)   Social Connection and Isolation Panel [NHANES]    Frequency of Communication with Friends and Family: More than three times a week    Frequency of Social Gatherings with Friends and Family: More than three times a week    Attends Religious Services: Not on Advertising copywriter or Organizations: Yes    Attends Music therapist: More than 4 times per year    Marital Status: Divorced    Tobacco Counseling Counseling given: Not Answered   Clinical Intake:  Pre-visit preparation completed:  Yes     BMI - recorded: 34.45 Nutritional Status: BMI > 30  Obese Nutritional Risks: None Diabetes: Yes (most recent A1C 6.1) How often do you need to have someone help you when you read instructions, pamphlets, or other written materials from your doctor or pharmacy?: 1 - Never Interpreter Needed?: No    Activities of Daily Living    04/11/2022    9:12 AM  In your present state of health, do you have any difficulty performing the following activities:  Hearing? 0  Vision? 0  Difficulty concentrating or making decisions? 0  Walking or climbing stairs? 1  Dressing or bathing?  0  Doing errands, shopping? 0  Preparing Food and eating ? N  Using the Toilet? N  In the past six months, have you accidently leaked urine? N  Do you have problems with loss of bowel control? N  Managing your Medications? N  Managing your Finances? N  Housekeeping or managing your Housekeeping? N    Patient Care Team: Rochel Brome, MD as PCP - General (Internal Medicine) Warden Fillers, MD as Consulting Physician (Ophthalmology) Wylene Simmer, MD as Consulting Physician (Orthopedic Surgery)     Assessment:   This is a routine wellness examination for [place].  Hearing/Vision screen No results found.  Dietary issues and exercise activities discussed: Current Exercise Habits: The patient does not participate in regular exercise at present, Exercise limited by: orthopedic condition(s)   Depression Screen    04/11/2022    9:39 AM 03/16/2022   10:43 AM 08/18/2021    8:27 AM 02/16/2021    8:19 AM 11/10/2020    8:33 AM 08/18/2020   11:29 AM 08/03/2020    8:31 AM  PHQ 2/9 Scores  PHQ - 2 Score 0 0 0 1 0 0 0  PHQ- 9 Score '1 1 1 1  '$ 0     Fall Risk    04/11/2022   10:10 AM 03/16/2022   10:44 AM 03/14/2021   11:20 AM 02/16/2021    8:50 AM 08/03/2020    8:31 AM  Fall Risk   Falls in the past year? 0 0 1 1 0  Number falls in past yr: 0 0 1 1 0  Injury with Fall? 0 0 0 0 0  Risk for fall due to : Impaired  balance/gait No Fall Risks History of fall(s);Impaired balance/gait;Orthopedic patient History of fall(s)   Follow up Falls evaluation completed;Education provided Falls evaluation completed Falls evaluation completed;Falls prevention discussed Falls evaluation completed;Falls prevention discussed Falls evaluation completed    FALL RISK PREVENTION PERTAINING TO THE HOME:  Any stairs in or around the home? No  If so, are there any without handrails? No  Home free of loose throw rugs in walkways, pet beds, electrical cords, etc? Yes  Adequate lighting in your home to reduce risk of falls? Yes   ASSISTIVE DEVICES UTILIZED TO PREVENT FALLS:  Use of a cane, walker or w/c? Yes  Grab bars in the bathroom? No  Shower chair or bench in shower? No  Elevated toilet seat or a handicapped toilet? No   Cognitive Function:        04/11/2022   10:11 AM 03/14/2021   11:22 AM 01/20/2020   10:14 AM  6CIT Screen  What Year? 0 points 0 points 0 points  What month? 0 points 0 points 0 points  What time? 0 points 0 points 0 points  Count back from 20 0 points 0 points 0 points  Months in reverse 0 points 0 points 0 points  Repeat phrase 0 points 0 points 0 points  Total Score 0 points 0 points 0 points    Immunizations Immunization History  Administered Date(s) Administered   COVID-19, mRNA, vaccine(Comirnaty)12 years and older 01/09/2022   Fluad Quad(high Dose 65+) 10/21/2019, 11/10/2020, 03/16/2022   Influenza, High Dose Seasonal PF 10/27/2018   Influenza-Unspecified 10/09/2018   Moderna Covid-19 Vaccine Bivalent Booster 20yr & up 11/10/2020   Moderna SARS-COV2 Booster Vaccination 12/08/2019, 06/21/2020   Moderna Sars-Covid-2 Vaccination 02/25/2019, 03/23/2019   PNEUMOCOCCAL CONJUGATE-20 03/16/2022   Pneumococcal Conjugate-13 12/21/2013   Pneumococcal Polysaccharide-23 08/29/2010   Tdap 05/17/2010, 03/11/2018  Zoster, Live 06/27/2011, 08/28/2012    TDAP status: Up to date  Flu Vaccine  status: Up to date  Pneumococcal vaccine status: Up to date  Covid-19 vaccine status: Completed vaccines  Qualifies for Shingles Vaccine? Yes   Zostavax completed No   Shingrix Completed?: No.    Education has been provided regarding the importance of this vaccine. Patient has been advised to call insurance company to determine out of pocket expense if they have not yet received this vaccine. Advised may also receive vaccine at local pharmacy or Health Dept. Verbalized acceptance and understanding.  Screening Tests Health Maintenance  Topic Date Due   Diabetic kidney evaluation - Urine ACR  Never done   Hepatitis C Screening  Never done   Zoster Vaccines- Shingrix (1 of 2) Never done   DEXA SCAN  03/24/2021   OPHTHALMOLOGY EXAM  09/05/2021   FOOT EXAM  02/16/2022   Medicare Annual Wellness (AWV)  03/14/2022   HEMOGLOBIN A1C  09/14/2022   Diabetic kidney evaluation - eGFR measurement  03/17/2023   DTaP/Tdap/Td (3 - Td or Tdap) 03/11/2028   Pneumonia Vaccine 61+ Years old  Completed   INFLUENZA VACCINE  Completed   COVID-19 Vaccine  Completed   HPV VACCINES  Aged Out   Fecal DNA (Cologuard)  Discontinued    Health Maintenance  Health Maintenance Due  Topic Date Due   Diabetic kidney evaluation - Urine ACR  Never done   Hepatitis C Screening  Never done   Zoster Vaccines- Shingrix (1 of 2) Never done   DEXA SCAN  03/24/2021   OPHTHALMOLOGY EXAM  09/05/2021   FOOT EXAM  02/16/2022   Medicare Annual Wellness (AWV)  03/14/2022    Colorectal cancer screening: No longer required.   Bone Density status: Completed 03/2019. Results reflect: Bone density results: NORMAL.  Lung Cancer Screening: (Low Dose CT Chest recommended if Age 12-80 years, 30 pack-year currently smoking OR have quit w/in 15years.) does not qualify.   Lung Cancer Screening Referral: N/A  Additional Screening:  Vision Screening: Recommended annual ophthalmology exams for early detection of glaucoma and  other disorders of the eye. Is the patient up to date with their annual eye exam?  Yes  Who is the provider or what is the name of the office in which the patient attends annual eye exams? [facility]  Dental Screening: Recommended annual dental exams for proper oral hygiene  Community Resource Referral / Chronic Care Management: CRR required this visit?  No   CCM required this visit?  No      Plan:    1- Records requested for eye exam  I have personally reviewed and noted the following in the patient's chart:   Medical and social history Use of alcohol, tobacco or illicit drugs  Current medications and supplements including opioid prescriptions.  Functional ability and status Nutritional status Physical activity Advanced directives List of other physicians Hospitalizations, surgeries, and ER visits in previous 12 months Vitals Screenings to include cognitive, depression, and falls Referrals and appointments  In addition, I have reviewed and discussed with patient certain preventive protocols, quality metrics, and best practice recommendations. A written personalized care plan for preventive services as well as general preventive health recommendations were provided to patient.     Erie Noe, LPN   [phone]

## 2022-04-11 NOTE — Patient Instructions (Signed)
Connie Marks , Thank you for taking time to come for your Medicare Wellness Visit. I appreciate your ongoing commitment to your health goals. Please review the following plan we discussed and let me know if I can assist you in the future.   Screening recommendations/referrals: Colonoscopy: No longer required Bone Density: Ordered previously Recommended yearly ophthalmology/optometry visit for glaucoma screening and checkup Recommended yearly dental visit for hygiene and checkup  Vaccinations: Influenza vaccine: up-to-date Pneumococcal vaccine: Complete Tdap vaccine: Due 03/2028 Shingles vaccine: Due - you can get this at the pharmacy     Preventive Care 65 Years and Older, Female   Preventive care refers to lifestyle choices and visits with your health care provider that can promote health and wellness.  What does preventive care include? A yearly physical exam. This is also called an annual well check. Dental exams once or twice a year. Routine eye exams. Ask your health care provider how often you should have your eyes checked. Personal lifestyle choices, including: Daily care of your teeth and gums. Regular physical activity. Eating a healthy diet. Avoiding tobacco and drug use. Limiting alcohol use. Practicing safe sex. Taking low-dose aspirin every day. Taking vitamin and mineral supplements as recommended by your health care provider.  What happens during an annual well check? The services and screenings done by your health care provider during your annual well check will depend on your age, overall health, lifestyle risk factors, and family history of disease.  Counseling Your health care provider may ask you questions about your: Alcohol use. Tobacco use. Drug use. Emotional well-being. Home and relationship well-being. Sexual activity. Eating habits. History of falls. Memory and ability to understand (cognition). Work and work Statistician. Reproductive  health.  Screening You may have the following tests or measurements: Height, weight, and BMI. Blood pressure. Lipid and cholesterol levels. These may be checked every 5 years, or more frequently if you are over 59 years old. Skin check. Lung cancer screening. You may have this screening every year starting at age 38 if you have a 30-pack-year history of smoking and currently smoke or have quit within the past 15 years. Fecal occult blood test (FOBT) of the stool. You may have this test every year starting at age 64. Flexible sigmoidoscopy or colonoscopy. You may have a sigmoidoscopy every 5 years or a colonoscopy every 10 years starting at age 25. Hepatitis C blood test. Hepatitis B blood test. Sexually transmitted disease (STD) testing. Diabetes screening. This is done by checking your blood sugar (glucose) after you have not eaten for a while (fasting). You may have this done every 1-3 years. Bone density scan. This is done to screen for osteoporosis. You may have this done starting at age 30. Mammogram. This may be done every 1-2 years. Talk to your health care provider about how often you should have regular mammograms. Talk with your health care provider about your test results, treatment options, and if necessary, the need for more tests.  Vaccines Your health care provider may recommend certain vaccines, such as: Influenza vaccine. This is recommended every year. Tetanus, diphtheria, and acellular pertussis (Tdap, Td) vaccine. You may need a Td booster every 10 years. Zoster vaccine. You may need this after age 14. Pneumococcal 13-valent conjugate (PCV13) vaccine. One dose is recommended after age 38. Pneumococcal polysaccharide (PPSV23) vaccine. One dose is recommended after age 56. Talk to your health care provider about which screenings and vaccines you need and how often you need them.  This  information is not intended to replace advice given to you by your health care provider.  Make sure you discuss any questions you have with your health care provider. Document Released: 02/18/2015 Document Revised: 10/12/2015 Document Reviewed: 11/23/2014 Elsevier Interactive Patient Education  2017 [employer] Prevention in the Home  Falls can cause injuries. They can happen to people of all ages. There are many things you can do to make your home safe and to help prevent falls.  What can I do on the outside of my home? Regularly fix the edges of walkways and driveways and fix any cracks. Remove anything that might make you trip as you walk through a door, such as a raised step or threshold. Trim any bushes or trees on the path to your home. Use bright outdoor lighting. Clear any walking paths of anything that might make someone trip, such as rocks or tools. Regularly check to see if handrails are loose or broken. Make sure that both sides of any steps have handrails. Any raised decks and porches should have guardrails on the edges. Have any leaves, snow, or ice cleared regularly. Use sand or salt on walking paths during winter. Clean up any spills in your garage right away. This includes oil or grease spills.  What can I do in the bathroom? Use night lights. Install grab bars by the toilet and in the tub and shower. Do not use towel bars as grab bars. Use non-skid mats or decals in the tub or shower. If you need to sit down in the shower, use a plastic, non-slip stool. Keep the floor dry. Clean up any water that spills on the floor as soon as it happens. Remove soap buildup in the tub or shower regularly. Attach bath mats securely with double-sided non-slip rug tape. Do not have throw rugs and other things on the floor that can make you trip.  What can I do in the bedroom? Use night lights. Make sure that you have a light by your bed that is easy to reach. Do not use any sheets or blankets that are too big for your bed. They should not hang down onto the  floor. Have a firm chair that has side arms. You can use this for support while you get dressed. Do not have throw rugs and other things on the floor that can make you trip.  What can I do in the kitchen? Clean up any spills right away. Avoid walking on wet floors. Keep items that you use a lot in easy-to-reach places. If you need to reach something above you, use a strong step stool that has a grab bar. Keep electrical cords out of the way. Do not use floor polish or wax that makes floors slippery. If you must use wax, use non-skid floor wax. Do not have throw rugs and other things on the floor that can make you trip.  What can I do with my stairs? Do not leave any items on the stairs. Make sure that there are handrails on both sides of the stairs and use them. Fix handrails that are broken or loose. Make sure that handrails are as long as the stairways. Check any carpeting to make sure that it is firmly attached to the stairs. Fix any carpet that is loose or worn. Avoid having throw rugs at the top or bottom of the stairs. If you do have throw rugs, attach them to the floor with carpet tape. Make sure that you  have a light switch at the top of the stairs and the bottom of the stairs. If you do not have them, ask someone to add them for you.  What else can I do to help prevent falls? Wear shoes that: Do not have high heels. Have rubber bottoms. Are comfortable and fit you well. Are closed at the toe. Do not wear sandals. If you use a stepladder: Make sure that it is fully opened. Do not climb a closed stepladder. Make sure that both sides of the stepladder are locked into place. Ask someone to hold it for you, if possible. Clearly mark and make sure that you can see: Any grab bars or handrails. First and last steps. Where the edge of each step is. Use tools that help you move around (mobility aids) if they are needed. These include: Canes. Walkers. Scooters. Crutches. Turn on  the lights when you go into a dark area. Replace any light bulbs as soon as they burn out. Set up your furniture so you have a clear path. Avoid moving your furniture around. If any of your floors are uneven, fix them. If there are any pets around you, be aware of where they are. Review your medicines with your doctor. Some medicines can make you feel dizzy. This can increase your chance of falling. Ask your doctor what other things that you can do to help prevent falls.  This information is not intended to replace advice given to you by your health care provider. Make sure you discuss any questions you have with your health care provider. Document Released: 11/18/2008 Document Revised: 06/30/2015 Document Reviewed: 02/26/2014 Elsevier Interactive Patient Education  2017 [employer].

## 2022-04-12 ENCOUNTER — Encounter: Payer: Self-pay | Admitting: Family Medicine

## 2022-04-19 ENCOUNTER — Telehealth: Payer: Self-pay | Admitting: Family Medicine

## 2022-04-19 NOTE — Telephone Encounter (Signed)
   Connie Marks has been scheduled for the following appointment:  WHAT: BONE DENSITY WHERE: [facility] DATE: 05/03/22 TIME: 12:30 PM CHECK-IN  Patient has been made aware.

## 2022-04-20 DIAGNOSIS — M25671 Stiffness of right ankle, not elsewhere classified: Secondary | ICD-10-CM | POA: Diagnosis not present

## 2022-04-20 DIAGNOSIS — R2689 Other abnormalities of gait and mobility: Secondary | ICD-10-CM | POA: Diagnosis not present

## 2022-04-20 DIAGNOSIS — M25571 Pain in right ankle and joints of right foot: Secondary | ICD-10-CM | POA: Diagnosis not present

## 2022-04-26 DIAGNOSIS — M25671 Stiffness of right ankle, not elsewhere classified: Secondary | ICD-10-CM | POA: Diagnosis not present

## 2022-04-26 DIAGNOSIS — R2689 Other abnormalities of gait and mobility: Secondary | ICD-10-CM | POA: Diagnosis not present

## 2022-04-26 DIAGNOSIS — M25571 Pain in right ankle and joints of right foot: Secondary | ICD-10-CM | POA: Diagnosis not present

## 2022-05-02 DIAGNOSIS — R2689 Other abnormalities of gait and mobility: Secondary | ICD-10-CM | POA: Diagnosis not present

## 2022-05-02 DIAGNOSIS — M25571 Pain in right ankle and joints of right foot: Secondary | ICD-10-CM | POA: Diagnosis not present

## 2022-05-02 DIAGNOSIS — M25671 Stiffness of right ankle, not elsewhere classified: Secondary | ICD-10-CM | POA: Diagnosis not present

## 2022-05-03 LAB — DG BONE DENSITY: HM Dexa Scan: NORMAL

## 2022-05-04 ENCOUNTER — Other Ambulatory Visit: Payer: Self-pay

## 2022-05-04 DIAGNOSIS — Z780 Asymptomatic menopausal state: Secondary | ICD-10-CM

## 2022-05-18 ENCOUNTER — Other Ambulatory Visit: Payer: Self-pay | Admitting: Family Medicine

## 2022-05-18 DIAGNOSIS — E782 Mixed hyperlipidemia: Secondary | ICD-10-CM

## 2022-05-19 ENCOUNTER — Other Ambulatory Visit: Payer: Self-pay | Admitting: Family Medicine

## 2022-05-19 DIAGNOSIS — F330 Major depressive disorder, recurrent, mild: Secondary | ICD-10-CM

## 2022-05-29 ENCOUNTER — Other Ambulatory Visit: Payer: Self-pay | Admitting: Family Medicine

## 2022-05-29 DIAGNOSIS — F330 Major depressive disorder, recurrent, mild: Secondary | ICD-10-CM

## 2022-05-29 DIAGNOSIS — G4709 Other insomnia: Secondary | ICD-10-CM

## 2022-06-16 DIAGNOSIS — G4733 Obstructive sleep apnea (adult) (pediatric): Secondary | ICD-10-CM | POA: Diagnosis not present

## 2022-06-20 DIAGNOSIS — G4733 Obstructive sleep apnea (adult) (pediatric): Secondary | ICD-10-CM | POA: Diagnosis not present

## 2022-08-16 ENCOUNTER — Other Ambulatory Visit: Payer: Self-pay | Admitting: Family Medicine

## 2022-08-16 DIAGNOSIS — E782 Mixed hyperlipidemia: Secondary | ICD-10-CM

## 2022-09-13 NOTE — Progress Notes (Signed)
Subjective:  Patient ID: Connie Marks, female    DOB: [birth year]  Age: 77 y.o. MRN: [id number]  Chief Complaint  Patient presents with   Medical Management of Chronic Issues    HPI   Diabetes: Patient normally eats healthy.  Diet controlled. Last A1C 6.1. Checks feet daily.  Hyperlipidemia: She is currently taking simvastatin 40 mg daily.  GERD: Takes Lansoprazole 30 mg  daily, Famotidine 40 mg at bedtime.  Depression: Patient is taking Wellbutrin 150 mg BID, Celexa 40 mg daily.  OSA: on CPAP. Trazodone 50 mg as needed   Last AWV 04/10/1952     09/14/2022   10:01 AM 04/11/2022    9:39 AM 03/16/2022   10:43 AM 08/18/2021    8:27 AM 02/16/2021    8:19 AM  Depression screen PHQ 2/9  Decreased Interest 1 0 0 0 0  Down, Depressed, Hopeless 0 0 0 0 1  PHQ - 2 Score 1 0 0 0 1  Altered sleeping 0 0 0 0 0  Tired, decreased energy 0 1 1 1  0  Change in appetite 0 0 0 0 0  Feeling bad or failure about yourself  0 0 0 0 0  Trouble concentrating 0 0 0 0 0  Moving slowly or fidgety/restless 0 0 0 0 0  Suicidal thoughts 0 0 0 0 0  PHQ-9 Score 1 1 1 1 1   Difficult doing work/chores Somewhat difficult Not difficult at all Not difficult at all Not difficult at all Not difficult at all        09/14/2022    9:48 AM  Fall Risk   Falls in the past year? 0  Number falls in past yr: 0  Injury with Fall? 0  Risk for fall due to : No Fall Risks  Follow up Falls evaluation completed    Patient Care Team: Blane Ohara, MD as PCP - General (Internal Medicine) Sallye Lat, MD as Consulting Physician (Ophthalmology) Toni Arthurs, MD as Consulting Physician (Orthopedic Surgery)   Review of Systems  Constitutional:  Negative for chills, fatigue and fever.  HENT:  Negative for congestion, ear pain, rhinorrhea and sore throat.   Respiratory:  Negative for cough and shortness of breath.   Cardiovascular:  Negative for chest pain.  Gastrointestinal:  Negative for abdominal pain,  constipation, diarrhea, nausea and vomiting.  Genitourinary:  Negative for dysuria and urgency.  Musculoskeletal:  Negative for back pain and myalgias.  Neurological:  Negative for dizziness, weakness, light-headedness and headaches.  Psychiatric/Behavioral:  Negative for dysphoric mood. The patient is not nervous/anxious.     Current Outpatient Medications on File Prior to Visit  Medication Sig Dispense Refill   Carboxymethylcellulose Sod PF (REFRESH PLUS) 0.5 % SOLN      aspirin EC 81 MG tablet Take 81 mg by mouth daily. Swallow whole.     buPROPion (WELLBUTRIN SR) 150 MG 12 hr tablet TAKE ONE TABLET BY MOUTH TWICE DAILY 180 tablet 1   Calcium Carbonate+Vitamin D 600-200 MG-UNIT TABS Take 1 tablet by mouth daily.     citalopram (CELEXA) 40 MG tablet TAKE ONE TABLET BY MOUTH DAILY 90 tablet 3   famotidine (PEPCID) 40 MG tablet Take 40 mg by mouth at bedtime.     lansoprazole (PREVACID) 30 MG capsule Take 1 capsule (30 mg total) by mouth daily at 12 noon. 90 capsule 3   Multiple Vitamins-Minerals (MULTIVITAL PO) Take by mouth.     pramipexole (MIRAPEX) 0.5 MG tablet TAKE ONE TABLET  BY MOUTH THREE TIMES DAILY 90 tablet 1   simvastatin (ZOCOR) 40 MG tablet TAKE ONE TABLET BY MOUTH ONCE DAILY 90 tablet 0   traZODone (DESYREL) 50 MG tablet TAKE 1/2 TO 1 TABLET BY MOUTH AT BEDTIME AS NEEDED FOR SLEEP 90 tablet 1   valACYclovir (VALTREX) 1000 MG tablet Take 2 tablets (2,000 mg total) by mouth 2 (two) times daily at onset 24 tablet 0   No current facility-administered medications on file prior to visit.   Past Medical History:  Diagnosis Date   Anxiety    Depression    GERD (gastroesophageal reflux disease)    Hyperlipidemia    Prediabetes    Sleep apnea    uses CPAP every night   Past Surgical History:  Procedure Laterality Date   CESAREAN SECTION     DIAGNOSTIC LAPAROSCOPY     FLAT FOOT RECONSTRUCTION-TAL GASTROC RECESSION Right 09/25/2021   In [place], [place]   HERNIA REPAIR      JOINT REPLACEMENT     knee replacements bilat   JOINT REPLACEMENT     hip replacements bilat   LIPOMA EXCISION     Dr Lequita Halt   ORIF ORBITAL FRACTURE Right 12/19/2018   Procedure: OPEN TREATMENT RIGHT ORBITAL FLOOR WITH IMPLANT, PERIORBITAL APPROACH;  Surgeon: Glenna Fellows, MD;  Location: [facility];  Service: Plastics;  Laterality: Right;   ORIF ORBITAL FRACTURE Right 02/03/2019   Procedure: OPEN TREATMENT RIGHT ORBITAL FLOOR FRACTURE REVISION, PERI ORBITAL APPROACH;  Surgeon: Glenna Fellows, MD;  Location: [facility];  Service: Plastics;  Laterality: Right;    Family History  Problem Relation Age of Onset   Diabetes Father    COPD Father    Pancreatic cancer Maternal Grandmother    Breast cancer Neg Hx    Social History   Socioeconomic History   Marital status: Divorced    Spouse name: Not on file   Number of children: 2   Years of education: Not on file   Highest education level: Not on file  Occupational History   Not on file  Tobacco Use   Smoking status: Former    Current packs/day: 0.00    Average packs/day: 1 pack/day for 15.0 years (15.0 ttl pk-yrs)    Types: Cigarettes    Start date: 12/19/1970    Quit date: 12/18/1985    Years since quitting: 36.7   Smokeless tobacco: Never  Vaping Use   Vaping status: Never Used  Substance and Sexual Activity   Alcohol use: Yes    Alcohol/week: 3.0 standard drinks of alcohol    Types: 2 Glasses of wine, 1 Shots of liquor per week    Comment: 2 glasses /week   Drug use: Never   Sexual activity: Not Currently  Other Topics Concern   Not on file  Social History Narrative   Daughter #1 - lives in [place], Daughter #2 - lives in [place], mother lives in [place]    Social Determinants of Health   Financial Resource Strain: Low Risk  (04/11/2022)   Overall Financial Resource Strain (CARDIA)    Difficulty of Paying Living Expenses: Not hard at all  Food Insecurity: No Food Insecurity  (04/11/2022)   Hunger Vital Sign    Worried About Running Out of Food in the Last Year: Never true    Ran Out of Food in the Last Year: Never true  Transportation Needs: No Transportation Needs (04/11/2022)   PRAPARE - Administrator, Civil Service (Medical):  No    Lack of Transportation (Non-Medical): No  Physical Activity: Inactive (09/14/2022)   Exercise Vital Sign    Days of Exercise per Week: 0 days    Minutes of Exercise per Session: 0 min  Stress: No Stress Concern Present (04/11/2022)   [employer] of Occupational Health - Occupational Stress Questionnaire    Feeling of Stress : Not at all  Social Connections: Moderately Isolated (09/14/2022)   Social Connection and Isolation Panel [NHANES]    Frequency of Communication with Friends and Family: More than three times a week    Frequency of Social Gatherings with Friends and Family: More than three times a week    Attends Religious Services: Never    Database administrator or Organizations: Yes    Attends Engineer, structural: More than 4 times per year    Marital Status: Divorced    Objective:  BP 136/72   Pulse 92   Temp (!) 97.3 F (36.3 C)   Ht 5\' 4"  (1.626 m)   Wt 211 lb (95.7 kg)   SpO2 98%   BMI 36.22 kg/m      09/14/2022    9:46 AM 03/16/2022   10:49 AM 08/18/2021    8:11 AM  BP/Weight  Systolic BP 136 132 134  Diastolic BP 72 62 78  Wt. (Lbs) 211 207 199  BMI 36.22 kg/m2 34.45 kg/m2 33.12 kg/m2    Physical Exam Vitals reviewed.  Constitutional:      Appearance: Normal appearance. She is obese.  Neck:     Vascular: No carotid bruit.  Cardiovascular:     Rate and Rhythm: Normal rate and regular rhythm.     Heart sounds: Normal heart sounds.  Pulmonary:     Effort: Pulmonary effort is normal. No respiratory distress.     Breath sounds: Normal breath sounds.  Abdominal:     General: Abdomen is flat. Bowel sounds are normal.     Palpations: Abdomen is soft.     Tenderness: There is  no abdominal tenderness.  Neurological:     Mental Status: She is alert and oriented to person, place, and time.  Psychiatric:        Mood and Affect: Mood normal.        Behavior: Behavior normal.     Diabetic Foot Exam - Simple   Simple Foot Form Diabetic Foot exam was performed with the following findings: Yes 09/14/2022 10:04 AM  Visual Inspection No deformities, no ulcerations, no other skin breakdown bilaterally: Yes Sensation Testing Intact to touch and monofilament testing bilaterally: Yes Pulse Check Posterior Tibialis and Dorsalis pulse intact bilaterally: Yes Comments      Lab Results  Component Value Date   WBC 7.3 03/16/2022   HGB 11.4 03/16/2022   HCT 35.4 03/16/2022   PLT 246 03/16/2022   GLUCOSE 108 (H) 03/16/2022   CHOL 178 03/16/2022   TRIG 128 03/16/2022   HDL 57 03/16/2022   LDLCALC 98 03/16/2022   ALT 16 03/16/2022   AST 20 03/16/2022   NA 143 03/16/2022   K 5.3 (H) 03/16/2022   CL 106 03/16/2022   CREATININE 0.96 03/16/2022   BUN 15 03/16/2022   CO2 20 03/16/2022   HGBA1C 6.1 (H) 03/16/2022      Assessment & Plan:    Hypertension complicating diabetes (HCC) Assessment & Plan: Diabetes and hypertension well controlled.  No changes to medicines.  Continue to work on eating a healthy diet and exercise.  Labs  drawn today.    Orders: -     CBC with Differential/Platelet -     Comprehensive metabolic panel  OSA on CPAP Assessment & Plan: Continue CPAP   Gastroesophageal reflux disease without esophagitis Assessment & Plan: The current medical regimen is effective;  continue present plan and medications. Continue Lansoprazole 30 mg  daily, Famotidine 40 mg at bedtime    Combined hyperlipidemia associated with type 2 diabetes mellitus (HCC) Assessment & Plan: Control: Well controlled Recommend check sugars fasting daily. Recommend check feet daily. Recommend annual eye exams. Medicines: none Continue to work on eating a healthy  diet and exercise.  Labs drawn today.     Orders: -     Hemoglobin A1c -     Microalbumin / creatinine urine ratio  Mixed hyperlipidemia Assessment & Plan: Well controlled.  No changes to medicines. currently taking simvastatin 40 mg daily Continue to work on eating a healthy diet and exercise.  Labs drawn today.   Orders: -     Lipid panel  Encounter for hepatitis C screening test for low risk patient -     HCV Ab w Reflex to Quant PCR  Encounter for screening mammogram for malignant neoplasm of breast -     Digital Screening Mammogram, Left and Right; Future     No orders of the defined types were placed in this encounter.   Orders Placed This Encounter  Procedures   MM DIGITAL SCREENING BILATERAL   CBC with Differential/Platelet   Comprehensive metabolic panel   Hemoglobin A1c   Lipid panel   Microalbumin / creatinine urine ratio   HCV Ab w Reflex to Quant PCR     Follow-up: Return in about 3 months (around 12/15/2022) for chronic.   I,Marla I Leal-Borjas,acting as a scribe for Blane Ohara, MD.,have documented all relevant documentation on the behalf of Blane Ohara, MD,as directed by  Blane Ohara, MD while in the presence of Blane Ohara, MD.   An After Visit Summary was printed and given to the patient.  Blane Ohara, MD  Family Practice [phone]

## 2022-09-13 NOTE — Assessment & Plan Note (Deleted)
Control: Well controlled Recommend check sugars fasting daily. Recommend check feet daily. Recommend annual eye exams. Medicines: no diabetes medicines. Continue simvastatin 40 mg daily. Continue to work on eating a healthy diet and exercise.  Labs drawn today.

## 2022-09-13 NOTE — Assessment & Plan Note (Signed)
Well controlled.  No changes to medicines. currently taking simvastatin 40 mg daily Continue to work on eating a healthy diet and exercise.  Labs drawn today.  

## 2022-09-13 NOTE — Assessment & Plan Note (Addendum)
Control: Well controlled Recommend check sugars fasting daily. Recommend check feet daily. Recommend annual eye exams. Medicines: no diabetes medicines or bp medicines.  Continue to work on eating a healthy diet and exercise.  Labs drawn today.

## 2022-09-13 NOTE — Assessment & Plan Note (Signed)
Continue CPAP.  

## 2022-09-13 NOTE — Assessment & Plan Note (Signed)
The current medical regimen is effective;  continue present plan and medications. Continue Lansoprazole 30 mg  daily, Famotidine 40 mg at bedtime

## 2022-09-14 ENCOUNTER — Encounter: Payer: Self-pay | Admitting: Family Medicine

## 2022-09-14 ENCOUNTER — Ambulatory Visit (INDEPENDENT_AMBULATORY_CARE_PROVIDER_SITE_OTHER): Payer: Medicare Other | Admitting: Family Medicine

## 2022-09-14 VITALS — BP 136/72 | HR 92 | Temp 97.3°F | Ht 64.0 in | Wt 211.0 lb

## 2022-09-14 DIAGNOSIS — Z1231 Encounter for screening mammogram for malignant neoplasm of breast: Secondary | ICD-10-CM

## 2022-09-14 DIAGNOSIS — E782 Mixed hyperlipidemia: Secondary | ICD-10-CM

## 2022-09-14 DIAGNOSIS — K219 Gastro-esophageal reflux disease without esophagitis: Secondary | ICD-10-CM | POA: Diagnosis not present

## 2022-09-14 DIAGNOSIS — Z1159 Encounter for screening for other viral diseases: Secondary | ICD-10-CM | POA: Diagnosis not present

## 2022-09-14 DIAGNOSIS — I152 Hypertension secondary to endocrine disorders: Secondary | ICD-10-CM | POA: Diagnosis not present

## 2022-09-14 DIAGNOSIS — E1159 Type 2 diabetes mellitus with other circulatory complications: Secondary | ICD-10-CM | POA: Diagnosis not present

## 2022-09-14 DIAGNOSIS — G4733 Obstructive sleep apnea (adult) (pediatric): Secondary | ICD-10-CM

## 2022-09-14 DIAGNOSIS — E1169 Type 2 diabetes mellitus with other specified complication: Secondary | ICD-10-CM

## 2022-09-14 NOTE — Patient Instructions (Signed)
Talk to Pharmacist about Shingrix vaccines.

## 2022-09-16 DIAGNOSIS — Z1159 Encounter for screening for other viral diseases: Secondary | ICD-10-CM | POA: Insufficient documentation

## 2022-09-16 DIAGNOSIS — Z1231 Encounter for screening mammogram for malignant neoplasm of breast: Secondary | ICD-10-CM | POA: Insufficient documentation

## 2022-09-21 DIAGNOSIS — Z961 Presence of intraocular lens: Secondary | ICD-10-CM | POA: Diagnosis not present

## 2022-09-21 DIAGNOSIS — S0231XS Fracture of orbital floor, right side, sequela: Secondary | ICD-10-CM | POA: Diagnosis not present

## 2022-09-21 DIAGNOSIS — H43813 Vitreous degeneration, bilateral: Secondary | ICD-10-CM | POA: Diagnosis not present

## 2022-09-21 DIAGNOSIS — H2512 Age-related nuclear cataract, left eye: Secondary | ICD-10-CM | POA: Diagnosis not present

## 2022-09-25 ENCOUNTER — Other Ambulatory Visit: Payer: Self-pay | Admitting: Physician Assistant

## 2022-10-04 DIAGNOSIS — G4733 Obstructive sleep apnea (adult) (pediatric): Secondary | ICD-10-CM | POA: Diagnosis not present

## 2022-10-09 ENCOUNTER — Other Ambulatory Visit: Payer: Self-pay | Admitting: Family Medicine

## 2022-10-19 DIAGNOSIS — M76821 Posterior tibial tendinitis, right leg: Secondary | ICD-10-CM | POA: Diagnosis not present

## 2022-10-19 DIAGNOSIS — M76829 Posterior tibial tendinitis, unspecified leg: Secondary | ICD-10-CM | POA: Diagnosis not present

## 2022-11-16 ENCOUNTER — Other Ambulatory Visit: Payer: Self-pay | Admitting: Family Medicine

## 2022-11-16 DIAGNOSIS — E782 Mixed hyperlipidemia: Secondary | ICD-10-CM

## 2022-11-18 DIAGNOSIS — G2581 Restless legs syndrome: Secondary | ICD-10-CM | POA: Diagnosis not present

## 2022-11-18 DIAGNOSIS — E871 Hypo-osmolality and hyponatremia: Secondary | ICD-10-CM | POA: Diagnosis not present

## 2022-11-18 DIAGNOSIS — A419 Sepsis, unspecified organism: Secondary | ICD-10-CM | POA: Diagnosis not present

## 2022-11-18 DIAGNOSIS — E860 Dehydration: Secondary | ICD-10-CM | POA: Diagnosis not present

## 2022-11-18 DIAGNOSIS — M19041 Primary osteoarthritis, right hand: Secondary | ICD-10-CM | POA: Diagnosis not present

## 2022-11-18 DIAGNOSIS — R509 Fever, unspecified: Secondary | ICD-10-CM | POA: Diagnosis not present

## 2022-11-18 DIAGNOSIS — E861 Hypovolemia: Secondary | ICD-10-CM | POA: Diagnosis not present

## 2022-11-18 DIAGNOSIS — R6520 Severe sepsis without septic shock: Secondary | ICD-10-CM | POA: Diagnosis not present

## 2022-11-18 DIAGNOSIS — Z7982 Long term (current) use of aspirin: Secondary | ICD-10-CM | POA: Diagnosis not present

## 2022-11-18 DIAGNOSIS — I951 Orthostatic hypotension: Secondary | ICD-10-CM | POA: Diagnosis not present

## 2022-11-18 DIAGNOSIS — N179 Acute kidney failure, unspecified: Secondary | ICD-10-CM | POA: Diagnosis not present

## 2022-11-18 DIAGNOSIS — N390 Urinary tract infection, site not specified: Secondary | ICD-10-CM | POA: Diagnosis not present

## 2022-11-18 DIAGNOSIS — M19042 Primary osteoarthritis, left hand: Secondary | ICD-10-CM | POA: Diagnosis not present

## 2022-11-18 DIAGNOSIS — Z1152 Encounter for screening for COVID-19: Secondary | ICD-10-CM | POA: Diagnosis not present

## 2022-11-18 DIAGNOSIS — Z792 Long term (current) use of antibiotics: Secondary | ICD-10-CM | POA: Diagnosis not present

## 2022-11-18 DIAGNOSIS — K219 Gastro-esophageal reflux disease without esophagitis: Secondary | ICD-10-CM | POA: Diagnosis not present

## 2022-11-18 DIAGNOSIS — E785 Hyperlipidemia, unspecified: Secondary | ICD-10-CM | POA: Diagnosis not present

## 2022-11-18 DIAGNOSIS — R531 Weakness: Secondary | ICD-10-CM | POA: Diagnosis not present

## 2022-11-18 DIAGNOSIS — E872 Acidosis, unspecified: Secondary | ICD-10-CM | POA: Diagnosis not present

## 2022-11-18 DIAGNOSIS — Z79899 Other long term (current) drug therapy: Secondary | ICD-10-CM | POA: Diagnosis not present

## 2022-11-18 DIAGNOSIS — E119 Type 2 diabetes mellitus without complications: Secondary | ICD-10-CM | POA: Diagnosis not present

## 2022-11-18 DIAGNOSIS — K449 Diaphragmatic hernia without obstruction or gangrene: Secondary | ICD-10-CM | POA: Diagnosis not present

## 2022-11-20 DIAGNOSIS — A419 Sepsis, unspecified organism: Secondary | ICD-10-CM | POA: Diagnosis not present

## 2022-11-22 LAB — BASIC METABOLIC PANEL: EGFR: 48

## 2022-11-26 NOTE — Progress Notes (Unsigned)
Subjective:  Patient ID: Connie Marks, female    DOB: [birth year]  Age: 77 y.o. MRN: [id number]  Chief Complaint  Patient presents with   Hospital follow up    HPI  Connie Marks is here for a hospital discharge follow up Follow up Hospitalization  Patient was admitted to [facility] on 11/18/2022 and discharged on 11/22/2022. She was treated for UTI, Acidosis, Sepsis after she presented with weakness and generalized malaise for couple days prior to presentation and a temperature of 103.7 with lower abdominal pain and frequency of urination. She had acute kidney injury which was treated with IV rehydration.  Blood and urine cultures were positive for E. coli.  After IV antibiotics, blood cultures returned negative at 48 hours Treatment for this included IV antibiotics and sent home with Cefdinir 300 mg 1 capsule twice daily # 14 capsules 7 days.  For her deconditioning, she was recommended outpatient physical therapy. States she had her right foot rebuilt a year ago, so has done outpatient PT for that.   She reports excellent compliance with treatment. She reports this condition is resolved.      11/27/2022    9:10 AM 09/14/2022   10:01 AM 04/11/2022    9:39 AM 03/16/2022   10:43 AM 08/18/2021    8:27 AM  Depression screen PHQ 2/9  Decreased Interest 0 1 0 0 0  Down, Depressed, Hopeless 0 0 0 0 0  PHQ - 2 Score 0 1 0 0 0  Altered sleeping 0 0 0 0 0  Tired, decreased energy 0 0 1 1 1   Change in appetite 0 0 0 0 0  Feeling bad or failure about yourself  0 0 0 0 0  Trouble concentrating 0 0 0 0 0  Moving slowly or fidgety/restless 0 0 0 0 0  Suicidal thoughts 0 0 0 0 0  PHQ-9 Score 0 1 1 1 1   Difficult doing work/chores Not difficult at all Somewhat difficult Not difficult at all Not difficult at all Not difficult at all        11/27/2022    9:10 AM  Fall Risk   Falls in the past year? 0  Number falls in past yr: 0  Injury with Fall? 0  Risk for fall due to : No Fall  Risks  Follow up Falls evaluation completed    Patient Care Team: Blane Ohara, MD as PCP - General (Internal Medicine) Sallye Lat, MD as Consulting Physician (Ophthalmology) Toni Arthurs, MD as Consulting Physician (Orthopedic Surgery)   Review of Systems  Constitutional:  Negative for appetite change, fatigue and fever.  HENT:  Negative for congestion, ear pain, sinus pressure and sore throat.   Respiratory:  Negative for cough, chest tightness, shortness of breath and wheezing.   Cardiovascular:  Negative for chest pain and palpitations.  Gastrointestinal:  Negative for abdominal pain, constipation, diarrhea, nausea and vomiting.  Genitourinary:  Negative for dysuria and hematuria.  Musculoskeletal:  Positive for arthralgias. Negative for back pain, joint swelling and myalgias.  Skin:  Negative for rash.  Neurological:  Negative for dizziness, weakness and headaches.  Psychiatric/Behavioral:  Negative for dysphoric mood. The patient is not nervous/anxious.     Current Outpatient Medications on File Prior to Visit  Medication Sig Dispense Refill   aspirin EC 81 MG tablet Take 81 mg by mouth daily. Swallow whole.     Calcium Carbonate+Vitamin D 600-200 MG-UNIT TABS Take 1 tablet by mouth daily.     Carboxymethylcellulose  Sod PF (REFRESH PLUS) 0.5 % SOLN      citalopram (CELEXA) 40 MG tablet TAKE ONE TABLET BY MOUTH DAILY 90 tablet 3   famotidine (PEPCID) 40 MG tablet Take 40 mg by mouth at bedtime.     lansoprazole (PREVACID) 30 MG capsule Take 1 capsule (30 mg total) by mouth daily at 12 noon. 90 capsule 3   Multiple Vitamins-Minerals (MULTIVITAL PO) Take by mouth.     pramipexole (MIRAPEX) 0.5 MG tablet TAKE ONE TABLET BY MOUTH THREE TIMES DAILY 90 tablet 1   simvastatin (ZOCOR) 40 MG tablet TAKE ONE TABLET BY MOUTH ONCE DAILY 90 tablet 0   traZODone (DESYREL) 50 MG tablet TAKE 1/2 TO 1 TABLET BY MOUTH AT BEDTIME AS NEEDED FOR SLEEP 90 tablet 1   valACYclovir (VALTREX)  1000 MG tablet Take 2 tablets (2,000 mg total) by mouth 2 (two) times daily at onset 24 tablet 0   No current facility-administered medications on file prior to visit.   Past Medical History:  Diagnosis Date   Anxiety    Depression    GERD (gastroesophageal reflux disease)    Hyperlipidemia    Prediabetes    Sleep apnea    uses CPAP every night   Past Surgical History:  Procedure Laterality Date   CESAREAN SECTION     DIAGNOSTIC LAPAROSCOPY     FLAT FOOT RECONSTRUCTION-TAL GASTROC RECESSION Right 09/25/2021   In [place], [place]   HERNIA REPAIR     JOINT REPLACEMENT     knee replacements bilat   JOINT REPLACEMENT     hip replacements bilat   LIPOMA EXCISION     Dr Lequita Halt   ORIF ORBITAL FRACTURE Right 12/19/2018   Procedure: OPEN TREATMENT RIGHT ORBITAL FLOOR WITH IMPLANT, PERIORBITAL APPROACH;  Surgeon: Glenna Fellows, MD;  Location: [facility];  Service: Plastics;  Laterality: Right;   ORIF ORBITAL FRACTURE Right 02/03/2019   Procedure: OPEN TREATMENT RIGHT ORBITAL FLOOR FRACTURE REVISION, PERI ORBITAL APPROACH;  Surgeon: Glenna Fellows, MD;  Location: [facility];  Service: Plastics;  Laterality: Right;    Family History  Problem Relation Age of Onset   Diabetes Father    COPD Father    Pancreatic cancer Maternal Grandmother    Breast cancer Neg Hx    Social History   Socioeconomic History   Marital status: Divorced    Spouse name: Not on file   Number of children: 2   Years of education: Not on file   Highest education level: Not on file  Occupational History   Not on file  Tobacco Use   Smoking status: Former    Current packs/day: 0.00    Average packs/day: 1 pack/day for 15.0 years (15.0 ttl pk-yrs)    Types: Cigarettes    Start date: 12/19/1970    Quit date: 12/18/1985    Years since quitting: 36.9   Smokeless tobacco: Never  Vaping Use   Vaping status: Never Used  Substance and Sexual Activity   Alcohol use: Yes     Alcohol/week: 3.0 standard drinks of alcohol    Types: 2 Glasses of wine, 1 Shots of liquor per week    Comment: 2 glasses /week   Drug use: Never   Sexual activity: Not Currently  Other Topics Concern   Not on file  Social History Narrative   Daughter #1 - lives in [place], Daughter #2 - lives in [place], mother lives in [place]    Social Determinants of Health  Financial Resource Strain: Low Risk  (04/11/2022)   Overall Financial Resource Strain (CARDIA)    Difficulty of Paying Living Expenses: Not hard at all  Food Insecurity: No Food Insecurity (04/11/2022)   Hunger Vital Sign    Worried About Running Out of Food in the Last Year: Never true    Ran Out of Food in the Last Year: Never true  Transportation Needs: No Transportation Needs (04/11/2022)   PRAPARE - Administrator, Civil Service (Medical): No    Lack of Transportation (Non-Medical): No  Physical Activity: Inactive (09/14/2022)   Exercise Vital Sign    Days of Exercise per Week: 0 days    Minutes of Exercise per Session: 0 min  Stress: No Stress Concern Present (04/11/2022)   [employer] of Occupational Health - Occupational Stress Questionnaire    Feeling of Stress : Not at all  Social Connections: Moderately Isolated (09/14/2022)   Social Connection and Isolation Panel [NHANES]    Frequency of Communication with Friends and Family: More than three times a week    Frequency of Social Gatherings with Friends and Family: More than three times a week    Attends Religious Services: Never    Database administrator or Organizations: Yes    Attends Engineer, structural: More than 4 times per year    Marital Status: Divorced    Objective:  BP 110/74 (BP Location: Right Arm, Patient Position: Sitting, Cuff Size: Large)   Pulse 72   Temp 97.7 F (36.5 C) (Temporal)   Resp 16   Ht 5\' 4"  (1.626 m)   Wt 212 lb 3.2 oz (96.3 kg)   SpO2 100%   BMI 36.42 kg/m      11/27/2022    9:11 AM 09/14/2022    9:46  AM 03/16/2022   10:49 AM  BP/Weight  Systolic BP 110 136 132  Diastolic BP 74 72 62  Wt. (Lbs) 212.2 211 207  BMI 36.42 kg/m2 36.22 kg/m2 34.45 kg/m2    Physical Exam Constitutional:      Appearance: She is obese.  HENT:     Head: Normocephalic and atraumatic.  Cardiovascular:     Rate and Rhythm: Normal rate and regular rhythm.  Pulmonary:     Effort: Pulmonary effort is normal.     Breath sounds: Normal breath sounds.  Abdominal:     General: There is no distension.     Tenderness: There is no abdominal tenderness.  Musculoskeletal:     Comments: Walks with a cane  Neurological:     General: No focal deficit present.     Mental Status: She is alert.  Psychiatric:        Mood and Affect: Mood normal.        Behavior: Behavior normal.     Diabetic Foot Exam - Simple   No data filed      Lab Results  Component Value Date   WBC 6.4 09/14/2022   HGB 11.3 09/14/2022   HCT 36.0 09/14/2022   PLT 245 09/14/2022   GLUCOSE 125 (H) 09/14/2022   CHOL 176 09/14/2022   TRIG 119 09/14/2022   HDL 63 09/14/2022   LDLCALC 92 09/14/2022   ALT 14 09/14/2022   AST 17 09/14/2022   NA 144 09/14/2022   K 5.0 09/14/2022   CL 107 (H) 09/14/2022   CREATININE 1.11 (H) 09/14/2022   BUN 17 09/14/2022   CO2 21 09/14/2022   HGBA1C 6.1 (H) 09/14/2022  Assessment & Plan:    Sepsis secondary to UTI [facility]) Assessment & Plan: Resolved   Hospital discharge follow-up Assessment & Plan: Admitted to the hospital from November 19, 2022 till November 22, 2022 for urinary tract infection, sepsis, acute kidney injury with positive urine and blood cultures with E. coli. Treated with IV fluids, IV antibiotics  Discharged in a stable condition with improved labs. She still has couple more days of cefdinir left to complete Recommended to continue to stay well-hydrated and report back if any worsening symptoms   Mild episode of recurrent major depressive disorder (HCC) Assessment &  Plan: Symptoms well controlled at this time with Wellbutrin SR 150 mg twice daily and citalopram 40 mg daily.  Refilled the medication Wellbutrin today.   Orders: -     buPROPion HCl ER (SR); Take 1 tablet (150 mg total) by mouth 2 (two) times daily.  Dispense: 180 tablet; Refill: 1     Meds ordered this encounter  Medications   buPROPion (WELLBUTRIN SR) 150 MG 12 hr tablet    Sig: Take 1 tablet (150 mg total) by mouth 2 (two) times daily.    Dispense:  180 tablet    Refill:  1    This prescription was filled on 05/29/2022. Any refills authorized will be placed on file.    No orders of the defined types were placed in this encounter.    Follow-up: No follow-ups on file.   I,Lauren M Auman,acting as a Neurosurgeon for [employer], MD.,have documented all relevant documentation on the behalf of Windell Moment, MD,as directed by  Windell Moment, MD while in the presence of Windell Moment, MD.   An After Visit Summary was printed and given to the patient.  Windell Moment, MD [facility] [phone]

## 2022-11-27 ENCOUNTER — Ambulatory Visit (INDEPENDENT_AMBULATORY_CARE_PROVIDER_SITE_OTHER): Payer: Medicare Other

## 2022-11-27 VITALS — BP 110/74 | HR 72 | Temp 97.7°F | Resp 16 | Ht 64.0 in | Wt 212.2 lb

## 2022-11-27 DIAGNOSIS — N390 Urinary tract infection, site not specified: Secondary | ICD-10-CM

## 2022-11-27 DIAGNOSIS — Z09 Encounter for follow-up examination after completed treatment for conditions other than malignant neoplasm: Secondary | ICD-10-CM | POA: Diagnosis not present

## 2022-11-27 DIAGNOSIS — A419 Sepsis, unspecified organism: Secondary | ICD-10-CM | POA: Diagnosis not present

## 2022-11-27 DIAGNOSIS — F330 Major depressive disorder, recurrent, mild: Secondary | ICD-10-CM | POA: Diagnosis not present

## 2022-11-27 HISTORY — DX: Sepsis, unspecified organism: A41.9

## 2022-11-27 MED ORDER — BUPROPION HCL ER (SR) 150 MG PO TB12
150.0000 mg | ORAL_TABLET | Freq: Two times a day (BID) | ORAL | 1 refills | Status: DC
Start: 2022-11-27 — End: 2023-01-09

## 2022-11-27 NOTE — Assessment & Plan Note (Signed)
Symptoms well controlled at this time with Wellbutrin SR 150 mg twice daily and citalopram 40 mg daily.  Refilled the medication Wellbutrin today.

## 2022-11-27 NOTE — Patient Instructions (Addendum)
Recommend finishing up the course of oral antibiotics. Stay well hydrated. Continue physical therapy and activity as tolerated Return with Dr.Cox in December.

## 2022-11-27 NOTE — Assessment & Plan Note (Signed)
Resolved

## 2022-11-27 NOTE — Assessment & Plan Note (Signed)
Admitted to the hospital from November 19, 2022 till November 22, 2022 for urinary tract infection, sepsis, acute kidney injury with positive urine and blood cultures with E. coli. Treated with IV fluids, IV antibiotics  Discharged in a stable condition with improved labs. She still has couple more days of cefdinir left to complete Recommended to continue to stay well-hydrated and report back if any worsening symptoms

## 2022-11-30 DIAGNOSIS — R2689 Other abnormalities of gait and mobility: Secondary | ICD-10-CM | POA: Diagnosis not present

## 2022-11-30 DIAGNOSIS — M6281 Muscle weakness (generalized): Secondary | ICD-10-CM | POA: Diagnosis not present

## 2022-11-30 DIAGNOSIS — M2560 Stiffness of unspecified joint, not elsewhere classified: Secondary | ICD-10-CM | POA: Diagnosis not present

## 2022-12-03 ENCOUNTER — Other Ambulatory Visit: Payer: Self-pay | Admitting: Family Medicine

## 2022-12-03 DIAGNOSIS — G4709 Other insomnia: Secondary | ICD-10-CM

## 2022-12-04 ENCOUNTER — Encounter: Payer: Self-pay | Admitting: Internal Medicine

## 2022-12-21 DIAGNOSIS — M6281 Muscle weakness (generalized): Secondary | ICD-10-CM | POA: Diagnosis not present

## 2022-12-21 DIAGNOSIS — M2560 Stiffness of unspecified joint, not elsewhere classified: Secondary | ICD-10-CM | POA: Diagnosis not present

## 2022-12-21 DIAGNOSIS — R2689 Other abnormalities of gait and mobility: Secondary | ICD-10-CM | POA: Diagnosis not present

## 2022-12-31 DIAGNOSIS — M2560 Stiffness of unspecified joint, not elsewhere classified: Secondary | ICD-10-CM | POA: Diagnosis not present

## 2022-12-31 DIAGNOSIS — R2689 Other abnormalities of gait and mobility: Secondary | ICD-10-CM | POA: Diagnosis not present

## 2022-12-31 DIAGNOSIS — M6281 Muscle weakness (generalized): Secondary | ICD-10-CM | POA: Diagnosis not present

## 2023-01-07 DIAGNOSIS — R2689 Other abnormalities of gait and mobility: Secondary | ICD-10-CM | POA: Diagnosis not present

## 2023-01-07 DIAGNOSIS — M2560 Stiffness of unspecified joint, not elsewhere classified: Secondary | ICD-10-CM | POA: Diagnosis not present

## 2023-01-07 DIAGNOSIS — M6281 Muscle weakness (generalized): Secondary | ICD-10-CM | POA: Diagnosis not present

## 2023-01-08 NOTE — Progress Notes (Unsigned)
Subjective:  Patient ID: Connie Marks, female    DOB: Oct 11, 1945  Age: 77 y.o. MRN: [id number]  Chief Complaint  Patient presents with   Medical Management of Chronic Issues    HPI History of Present Illness The patient, with a history diabetes, hyperlipidemia and reconstructive surgery of the right foot arch and realignment in 09/2021, presents for a chronic visit. Her main complaint is bilateral foot tingling described as 'pins and needles.' She denies any association with the recent surgery. She reports good recovery from the surgery, but notes that her leg has bowed during the healing process, causing an abnormal gait. Her pain is still much better.   The patient reports early morning awakenings which occur if she does not take trazodone. She has no difficulty falling back asleep after nocturnal urination. She expresses difficulty with motivation and has plans to declutter her home with the help of her daughters.  The patient has concerns about her blood sugar levels after a hospital nurse offered her insulin during her recent hospital stay. She reports efforts to eat healthily, limiting sugars and carbs, and if diabetes is poorly controlled on A1c would consider medication. Her daughter has done well on ozempic and she would consider this medicaton.   Diabetes: Patient normally eats healthy.  Diet controlled. Last A1C 6.1. Checks feet daily. Hyperlipidemia: She is currently taking simvastatin 40 mg daily. GERD: Takes Lansoprazole 30 mg  daily, Famotidine 40 mg at bedtime. Takes otc Depression: Patient is taking Wellbutrin SR 150 mg twice daily, Celexa 40 mg daily. OSA: on CPAP.      01/09/2023    9:54 AM 11/27/2022    9:10 AM 09/14/2022   10:01 AM 04/11/2022    9:39 AM 03/16/2022   10:43 AM  Depression screen PHQ 2/9  Decreased Interest 1 0 1 0 0  Down, Depressed, Hopeless 0 0 0 0 0  PHQ - 2 Score 1 0 1 0 0  Altered sleeping 1 0 0 0 0  Tired, decreased energy 2 0 0 1 1  Change  in appetite 0 0 0 0 0  Feeling bad or failure about yourself  0 0 0 0 0  Trouble concentrating 0 0 0 0 0  Moving slowly or fidgety/restless 0 0 0 0 0  Suicidal thoughts 0 0 0 0 0  PHQ-9 Score 4 0 1 1 1   Difficult doing work/chores Somewhat difficult Not difficult at all Somewhat difficult Not difficult at all Not difficult at all        11/27/2022    9:10 AM  Fall Risk   Falls in the past year? 0  Number falls in past yr: 0  Injury with Fall? 0  Risk for fall due to : No Fall Risks  Follow up Falls evaluation completed    Patient Care Team: Blane Ohara, MD as PCP - General (Internal Medicine) Sallye Lat, MD as Consulting Physician (Ophthalmology) Toni Arthurs, MD as Consulting Physician (Orthopedic Surgery)   Review of Systems  Constitutional:  Negative for chills, fatigue and fever.  HENT:  Negative for congestion, ear pain, rhinorrhea and sore throat.   Respiratory:  Negative for cough and shortness of breath.   Cardiovascular:  Negative for chest pain.  Gastrointestinal:  Negative for abdominal pain, constipation, diarrhea, nausea and vomiting.  Genitourinary:  Negative for dysuria and urgency.  Musculoskeletal:  Negative for back pain and myalgias.  Neurological:  Negative for dizziness, weakness, light-headedness and headaches.  Psychiatric/Behavioral:  Negative for  dysphoric mood. The patient is not nervous/anxious.     Current Outpatient Medications on File Prior to Visit  Medication Sig Dispense Refill   aspirin EC 81 MG tablet Take 81 mg by mouth daily. Swallow whole.     Calcium Carbonate+Vitamin D 600-200 MG-UNIT TABS Take 1 tablet by mouth daily.     Carboxymethylcellulose Sod PF (REFRESH PLUS) 0.5 % SOLN      famotidine (PEPCID) 40 MG tablet Take 40 mg by mouth at bedtime.     lansoprazole (PREVACID) 30 MG capsule Take 1 capsule (30 mg total) by mouth daily at 12 noon. 90 capsule 3   Multiple Vitamins-Minerals (MULTIVITAL PO) Take by mouth.      pramipexole (MIRAPEX) 0.5 MG tablet TAKE ONE TABLET BY MOUTH THREE TIMES DAILY 90 tablet 1   simvastatin (ZOCOR) 40 MG tablet TAKE ONE TABLET BY MOUTH ONCE DAILY 90 tablet 0   traZODone (DESYREL) 50 MG tablet TAKE 1/2 TO 1 TABLET BY MOUTH AT BEDTIME AS NEEDED FOR SLEEP 90 tablet 1   valACYclovir (VALTREX) 1000 MG tablet Take 2 tablets (2,000 mg total) by mouth 2 (two) times daily at onset 24 tablet 0   No current facility-administered medications on file prior to visit.   Past Medical History:  Diagnosis Date   Anxiety    Depression    GERD (gastroesophageal reflux disease)    Hyperlipidemia    Prediabetes    Sepsis secondary to UTI (HCC) 11/27/2022   Sleep apnea    uses CPAP every night   Past Surgical History:  Procedure Laterality Date   CESAREAN SECTION     DIAGNOSTIC LAPAROSCOPY     FLAT FOOT RECONSTRUCTION-TAL GASTROC RECESSION Right 09/25/2021   In [place], [place]   HERNIA REPAIR     JOINT REPLACEMENT     knee replacements bilat   JOINT REPLACEMENT     hip replacements bilat   LIPOMA EXCISION     Dr Lequita Halt   ORIF ORBITAL FRACTURE Right 12/19/2018   Procedure: OPEN TREATMENT RIGHT ORBITAL FLOOR WITH IMPLANT, PERIORBITAL APPROACH;  Surgeon: Glenna Fellows, MD;  Location: [facility];  Service: Plastics;  Laterality: Right;   ORIF ORBITAL FRACTURE Right 02/03/2019   Procedure: OPEN TREATMENT RIGHT ORBITAL FLOOR FRACTURE REVISION, PERI ORBITAL APPROACH;  Surgeon: Glenna Fellows, MD;  Location: [facility];  Service: Plastics;  Laterality: Right;    Family History  Problem Relation Age of Onset   Diabetes Father    COPD Father    Pancreatic cancer Maternal Grandmother    Breast cancer Neg Hx    Social History   Socioeconomic History   Marital status: Divorced    Spouse name: Not on file   Number of children: 2   Years of education: Not on file   Highest education level: Bachelor's degree (e.g., BA, AB, BS)  Occupational History   Not  on file  Tobacco Use   Smoking status: Former    Current packs/day: 0.00    Average packs/day: 1 pack/day for 15.0 years (15.0 ttl pk-yrs)    Types: Cigarettes    Start date: 12/19/1970    Quit date: 12/18/1985    Years since quitting: 37.0   Smokeless tobacco: Never  Vaping Use   Vaping status: Never Used  Substance and Sexual Activity   Alcohol use: Yes    Alcohol/week: 3.0 standard drinks of alcohol    Types: 2 Glasses of wine, 1 Shots of liquor per week  Comment: 2 glasses /week   Drug use: Never   Sexual activity: Not Currently  Other Topics Concern   Not on file  Social History Narrative   Daughter #1 - lives in [place], Daughter #2 - lives in [place], mother lives in [place]    Social Determinants of Health   Financial Resource Strain: Low Risk  (01/05/2023)   Overall Financial Resource Strain (CARDIA)    Difficulty of Paying Living Expenses: Not very hard  Food Insecurity: No Food Insecurity (01/05/2023)   Hunger Vital Sign    Worried About Running Out of Food in the Last Year: Never true    Ran Out of Food in the Last Year: Never true  Transportation Needs: No Transportation Needs (01/05/2023)   PRAPARE - Administrator, Civil Service (Medical): No    Lack of Transportation (Non-Medical): No  Physical Activity: Insufficiently Active (01/05/2023)   Exercise Vital Sign    Days of Exercise per Week: 1 day    Minutes of Exercise per Session: 10 min  Stress: No Stress Concern Present (01/05/2023)   [employer] of Occupational Health - Occupational Stress Questionnaire    Feeling of Stress : Only a little  Social Connections: Moderately Isolated (01/05/2023)   Social Connection and Isolation Panel [NHANES]    Frequency of Communication with Friends and Family: More than three times a week    Frequency of Social Gatherings with Friends and Family: Three times a week    Attends Religious Services: Never    Active Member of Clubs or Organizations: No     Attends Engineer, structural: More than 4 times per year    Marital Status: Divorced    Objective:  BP 120/68   Pulse 68   Temp 97.8 F (36.6 C)   Ht 5\' 4"  (1.626 m)   Wt 209 lb (94.8 kg)   SpO2 94%   BMI 35.87 kg/m      01/09/2023    9:52 AM 11/27/2022    9:11 AM 09/14/2022    9:46 AM  BP/Weight  Systolic BP 120 110 136  Diastolic BP 68 74 72  Wt. (Lbs) 209 212.2 211  BMI 35.87 kg/m2 36.42 kg/m2 36.22 kg/m2    Physical Exam Vitals reviewed.  Constitutional:      Appearance: Normal appearance. She is obese.  Neck:     Vascular: No carotid bruit.  Cardiovascular:     Rate and Rhythm: Normal rate and regular rhythm.     Heart sounds: Normal heart sounds.  Pulmonary:     Effort: Pulmonary effort is normal. No respiratory distress.     Breath sounds: Normal breath sounds.  Abdominal:     General: Abdomen is flat. Bowel sounds are normal.     Palpations: Abdomen is soft.     Tenderness: There is no abdominal tenderness.  Neurological:     Mental Status: She is alert and oriented to person, place, and time.  Psychiatric:        Mood and Affect: Mood normal.        Behavior: Behavior normal.     Diabetic Foot Exam - Simple   Simple Foot Form  01/09/2023 10:54 AM  Visual Inspection See comments: Yes Sensation Testing See comments: Yes Pulse Check Posterior Tibialis and Dorsalis pulse intact bilaterally: Yes Comments Decreased sensation of BL feet. Flat feet. Right foot turns out due to issues with right knee.       Lab Results  Component Value  Date   WBC 6.4 09/14/2022   HGB 11.3 09/14/2022   HCT 36.0 09/14/2022   PLT 245 09/14/2022   GLUCOSE 125 (H) 09/14/2022   CHOL 176 09/14/2022   TRIG 119 09/14/2022   HDL 63 09/14/2022   LDLCALC 92 09/14/2022   ALT 14 09/14/2022   AST 17 09/14/2022   NA 144 09/14/2022   K 5.0 09/14/2022   CL 107 (H) 09/14/2022   CREATININE 1.11 (H) 09/14/2022   BUN 17 09/14/2022   CO2 21 09/14/2022   HGBA1C 6.1  (H) 09/14/2022      Assessment & Plan:   Hypertension complicating diabetes (HCC) -     CBC with Differential/Platelet -     Comprehensive metabolic panel -     Hemoglobin A1c  Combined hyperlipidemia associated with type 2 diabetes mellitus (HCC) Assessment & Plan: Control: has been good. Await labs. Recommend check feet daily. Recommend annual eye exams. Medicines: none except zocor for cholesterol. Continue to work on eating a healthy diet and exercise.  Labs drawn today.      Mixed hyperlipidemia Assessment & Plan: Well controlled.  No changes to medicines. currently taking simvastatin 40 mg daily Continue to work on eating a healthy diet and exercise.  Labs drawn today.   Orders: -     Lipid panel  Mild episode of recurrent major depressive disorder [facility]) Assessment & Plan: Patient reports difficulty with motivation but otherwise appears to be managing well on current regimen of bupropion and citalopram. -Continue current medications.   Orders: -     buPROPion HCl ER (SR); Take 1 tablet (150 mg total) by mouth 2 (two) times daily.  Dispense: 180 tablet; Refill: 1 -     Citalopram Hydrobromide; Take 1 tablet (40 mg total) by mouth daily.  Dispense: 90 tablet; Refill: 1  Encounter for immunization -     Flu Vaccine Trivalent High Dose (Fluad) -     Pfizer Comirnaty Covid-19 Vaccine 47yrs & older  Neuropathy Assessment & Plan: Bilateral foot paresthesia described as "pins and needles". No clear etiology identified yet. -Order blood work to check for vitamin B12 and folate deficiencies. -Consider checking for diabetes given the patient's symptoms.   OSA on CPAP Assessment & Plan: Continue CPAP. Benefiting. Compliant with use.    Severe obesity with body mass index (BMI) of 35.0 to 35.9 and comorbidity (HCC) Assessment & Plan: Recommend continue to work on eating healthy diet and exercise. Consider addition of ozempic.   General Health  Maintenance -Request eye exam records from [facility] in [place]. -Check A1C to assess for potential diabetes. -Consider Ozempic for weight loss if A1C is high. -Continue healthy eating habits. -Continue with flu and COVID-19 vaccinations as per schedule.   Meds ordered this encounter  Medications   buPROPion (WELLBUTRIN SR) 150 MG 12 hr tablet    Sig: Take 1 tablet (150 mg total) by mouth 2 (two) times daily.    Dispense:  180 tablet    Refill:  1    This prescription was filled on 05/29/2022. Any refills authorized will be placed on file.   citalopram (CELEXA) 40 MG tablet    Sig: Take 1 tablet (40 mg total) by mouth daily.    Dispense:  90 tablet    Refill:  1    Orders Placed This Encounter  Procedures   Flu Vaccine Trivalent High Dose (Fluad)   Pfizer Comirnaty Covid-19 Vaccine 21yrs & older   CBC with Differential/Platelet  Comprehensive metabolic panel   Hemoglobin A1c   Lipid panel     Follow-up: Return in about 3 months (around 04/09/2023) for chronic follow up.   I,Marla I Leal-Borjas,acting as a scribe for Blane Ohara, MD.,have documented all relevant documentation on the behalf of Blane Ohara, MD,as directed by  Blane Ohara, MD while in the presence of Blane Ohara, MD.   An After Visit Summary was printed and given to the patient.  I attest that I have reviewed this visit and agree with the plan scribed by my staff.   Blane Ohara, MD [facility] [phone]

## 2023-01-09 ENCOUNTER — Encounter: Payer: Self-pay | Admitting: Family Medicine

## 2023-01-09 ENCOUNTER — Ambulatory Visit (INDEPENDENT_AMBULATORY_CARE_PROVIDER_SITE_OTHER): Payer: Medicare Other | Admitting: Family Medicine

## 2023-01-09 VITALS — BP 120/68 | HR 68 | Temp 97.8°F | Ht 64.0 in | Wt 209.0 lb

## 2023-01-09 DIAGNOSIS — E1159 Type 2 diabetes mellitus with other circulatory complications: Secondary | ICD-10-CM

## 2023-01-09 DIAGNOSIS — E782 Mixed hyperlipidemia: Secondary | ICD-10-CM | POA: Diagnosis not present

## 2023-01-09 DIAGNOSIS — Z23 Encounter for immunization: Secondary | ICD-10-CM | POA: Diagnosis not present

## 2023-01-09 DIAGNOSIS — F330 Major depressive disorder, recurrent, mild: Secondary | ICD-10-CM

## 2023-01-09 DIAGNOSIS — E119 Type 2 diabetes mellitus without complications: Secondary | ICD-10-CM | POA: Diagnosis not present

## 2023-01-09 DIAGNOSIS — I10 Essential (primary) hypertension: Secondary | ICD-10-CM

## 2023-01-09 DIAGNOSIS — G629 Polyneuropathy, unspecified: Secondary | ICD-10-CM

## 2023-01-09 DIAGNOSIS — E1169 Type 2 diabetes mellitus with other specified complication: Secondary | ICD-10-CM

## 2023-01-09 DIAGNOSIS — G4733 Obstructive sleep apnea (adult) (pediatric): Secondary | ICD-10-CM

## 2023-01-09 DIAGNOSIS — Z6835 Body mass index (BMI) 35.0-35.9, adult: Secondary | ICD-10-CM

## 2023-01-09 LAB — COMPREHENSIVE METABOLIC PANEL
ALT: 17 [IU]/L (ref 0–32)
AST: 21 [IU]/L (ref 0–40)
Albumin: 4.5 g/dL (ref 3.8–4.8)
Alkaline Phosphatase: 68 [IU]/L (ref 44–121)
BUN/Creatinine Ratio: 17 (ref 12–28)
BUN: 21 mg/dL (ref 8–27)
Bilirubin Total: 0.5 mg/dL (ref 0.0–1.2)
CO2: 22 mmol/L (ref 20–29)
Calcium: 9.4 mg/dL (ref 8.7–10.3)
Chloride: 105 mmol/L (ref 96–106)
Creatinine, Ser: 1.22 mg/dL — ABNORMAL HIGH (ref 0.57–1.00)
Globulin, Total: 2.3 g/dL (ref 1.5–4.5)
Glucose: 101 mg/dL — ABNORMAL HIGH (ref 70–99)
Potassium: 5 mmol/L (ref 3.5–5.2)
Sodium: 142 mmol/L (ref 134–144)
Total Protein: 6.8 g/dL (ref 6.0–8.5)
eGFR: 46 mL/min/{1.73_m2} — ABNORMAL LOW (ref >59)

## 2023-01-09 LAB — CBC WITH DIFFERENTIAL/PLATELET
Basophils Absolute: 0.1 10*3/uL (ref 0.0–0.2)
Basos: 1 %
EOS (ABSOLUTE): 0.2 10*3/uL (ref 0.0–0.4)
Eos: 2 %
Hematocrit: 35 % (ref 34.0–46.6)
Hemoglobin: 11 g/dL — ABNORMAL LOW (ref 11.1–15.9)
Immature Grans (Abs): 0 10*3/uL (ref 0.0–0.1)
Immature Granulocytes: 0 %
Lymphocytes Absolute: 2.3 10*3/uL (ref 0.7–3.1)
Lymphs: 28 %
MCH: 26.4 pg — ABNORMAL LOW (ref 26.6–33.0)
MCHC: 31.4 g/dL — ABNORMAL LOW (ref 31.5–35.7)
MCV: 84 fL (ref 79–97)
Monocytes Absolute: 0.8 10*3/uL (ref 0.1–0.9)
Monocytes: 10 %
Neutrophils Absolute: 4.8 10*3/uL (ref 1.4–7.0)
Neutrophils: 59 %
Platelets: 276 10*3/uL (ref 150–450)
RBC: 4.16 x10E6/uL (ref 3.77–5.28)
RDW: 15.7 % — ABNORMAL HIGH (ref 11.7–15.4)
WBC: 8.1 10*3/uL (ref 3.4–10.8)

## 2023-01-09 LAB — HEMOGLOBIN A1C
Est. average glucose Bld gHb Est-mCnc: 134 mg/dL
Hgb A1c MFr Bld: 6.3 % — ABNORMAL HIGH (ref 4.8–5.6)

## 2023-01-09 LAB — LIPID PANEL
Chol/HDL Ratio: 3.1 {ratio} (ref 0.0–4.4)
Cholesterol, Total: 173 mg/dL (ref 100–199)
HDL: 55 mg/dL (ref >39)
LDL Chol Calc (NIH): 95 mg/dL (ref 0–99)
Triglycerides: 128 mg/dL (ref 0–149)
VLDL Cholesterol Cal: 23 mg/dL (ref 5–40)

## 2023-01-09 MED ORDER — BUPROPION HCL ER (SR) 150 MG PO TB12: 150.0000 mg | ORAL_TABLET | Freq: Two times a day (BID) | ORAL | 1 refills | Status: DC

## 2023-01-09 MED ORDER — CITALOPRAM HYDROBROMIDE 40 MG PO TABS: 40.0000 mg | ORAL_TABLET | Freq: Every day | ORAL | 1 refills | Status: DC

## 2023-01-09 NOTE — Assessment & Plan Note (Signed)
Continue CPAP. Benefiting. Compliant with use.

## 2023-01-09 NOTE — Assessment & Plan Note (Signed)
Recommend continue to work on eating healthy diet and exercise. Consider addition of ozempic.

## 2023-01-09 NOTE — Assessment & Plan Note (Signed)
Well controlled.  No changes to medicines. currently taking simvastatin 40 mg daily Continue to work on eating a healthy diet and exercise.  Labs drawn today.  

## 2023-01-09 NOTE — Assessment & Plan Note (Signed)
Control: has been good. Await labs. Recommend check feet daily. Recommend annual eye exams. Medicines: none except zocor for cholesterol. Continue to work on eating a healthy diet and exercise.  Labs drawn today.

## 2023-01-09 NOTE — Patient Instructions (Signed)
VISIT SUMMARY:  During today's visit, we discussed several health concerns including tingling in your feet, post-surgical recovery, hair loss, sleep issues, motivation, blood sugar levels, and general health maintenance. We reviewed your current medications and made plans for further testing and continued management of your conditions.  YOUR PLAN:  -PERIPHERAL NEUROPATHY: Peripheral neuropathy is a condition that results in tingling, numbness, and pain in the extremities. We will conduct blood tests to check for vitamin B12 and folate deficiencies and consider diabetes as a potential cause.  -DEPRESSION: Depression is a mood disorder that causes persistent feelings of sadness and loss of interest. You are currently managing well on bupropion and citalopram, so we will continue with these medications.  -HYPERLIPIDEMIA: Hyperlipidemia is a condition of having high levels of lipids (fats) in the blood. Continue taking simvastatin as prescribed to manage your cholesterol levels.  -GASTROESOPHAGEAL REFLUX DISEASE (GERD): GERD is a digestive disorder that affects the lower esophageal sphincter, leading to acid reflux. Continue managing your symptoms with over-the-counter lansoprazole and famotidine.  -SLEEP APNEA: Sleep apnea is a sleep disorder where breathing repeatedly stops and starts. Continue using your CPAP machine and taking trazodone as prescribed to help with sleep.  -GENERAL HEALTH MAINTENANCE: We will request your eye exam records from [facility] in [place] and check your A1C levels to assess for potential diabetes. If your A1C is high, we will consider Ozempic for diabetes management and weight loss. Continue with your healthy eating habits and stay up to date with flu and COVID-19 vaccinations.  INSTRUCTIONS:  Please follow up with the recommended blood work to check for vitamin B12, folate deficiencies, and A1C levels. Continue with your current medications and physical therapy.  We will also request your eye exam records from [facility] in [place]. If you have any new symptoms or concerns, please schedule another appointment.

## 2023-01-09 NOTE — Assessment & Plan Note (Signed)
Bilateral foot paresthesia described as "pins and needles". No clear etiology identified yet. -Order blood work to check for vitamin B12 and folate deficiencies. -Consider checking for diabetes given the patient's symptoms.

## 2023-01-09 NOTE — Assessment & Plan Note (Signed)
Patient reports difficulty with motivation but otherwise appears to be managing well on current regimen of bupropion and citalopram. -Continue current medications.

## 2023-01-11 DIAGNOSIS — R2689 Other abnormalities of gait and mobility: Secondary | ICD-10-CM | POA: Diagnosis not present

## 2023-01-11 DIAGNOSIS — M6281 Muscle weakness (generalized): Secondary | ICD-10-CM | POA: Diagnosis not present

## 2023-01-11 DIAGNOSIS — M2560 Stiffness of unspecified joint, not elsewhere classified: Secondary | ICD-10-CM | POA: Diagnosis not present

## 2023-01-14 ENCOUNTER — Telehealth: Payer: Self-pay | Admitting: Family Medicine

## 2023-01-14 NOTE — Telephone Encounter (Signed)
[facility] 11/30/2022

## 2023-01-18 DIAGNOSIS — R2689 Other abnormalities of gait and mobility: Secondary | ICD-10-CM | POA: Diagnosis not present

## 2023-01-18 DIAGNOSIS — M2560 Stiffness of unspecified joint, not elsewhere classified: Secondary | ICD-10-CM | POA: Diagnosis not present

## 2023-01-18 DIAGNOSIS — M6281 Muscle weakness (generalized): Secondary | ICD-10-CM | POA: Diagnosis not present

## 2023-02-11 ENCOUNTER — Telehealth: Payer: Self-pay

## 2023-02-11 NOTE — Telephone Encounter (Signed)
 2 ND REQUEST: [facility] P[facility]: DATE: 01/11/2023. START OF CARE: 11/30/2022.

## 2023-02-14 ENCOUNTER — Other Ambulatory Visit: Payer: Self-pay | Admitting: Family Medicine

## 2023-02-14 DIAGNOSIS — E782 Mixed hyperlipidemia: Secondary | ICD-10-CM

## 2023-04-01 ENCOUNTER — Other Ambulatory Visit: Payer: Self-pay

## 2023-04-01 ENCOUNTER — Telehealth: Payer: Self-pay

## 2023-04-01 DIAGNOSIS — M79671 Pain in right foot: Secondary | ICD-10-CM

## 2023-04-01 NOTE — Telephone Encounter (Signed)
 A referral order is needed to Podiatrist

## 2023-04-01 NOTE — Telephone Encounter (Signed)
 Copied from CRM [id number]. Topic: Referral - Request for Referral >> Apr 01, 2023  2:41 PM Prudencio Pair wrote: Did the patient discuss referral with their provider in the last year? No (If No - schedule appointment) (If Yes - send message)  Appointment offered? No  Type of order/referral and detailed reason for visit: Patient states she has a very painful corn on her right foot & wants to be referred to a podiatrist  Preference of office, provider, location: N/A  If referral order, have you been seen by this specialty before? No (If Yes, this issue or another issue? When? Where?  Can we respond through MyChart? Yes

## 2023-04-09 ENCOUNTER — Ambulatory Visit: Payer: Medicare Other | Admitting: Podiatry

## 2023-04-09 ENCOUNTER — Ambulatory Visit (INDEPENDENT_AMBULATORY_CARE_PROVIDER_SITE_OTHER)

## 2023-04-09 ENCOUNTER — Encounter: Payer: Self-pay | Admitting: Podiatry

## 2023-04-09 DIAGNOSIS — M2041 Other hammer toe(s) (acquired), right foot: Secondary | ICD-10-CM

## 2023-04-09 DIAGNOSIS — L84 Corns and callosities: Secondary | ICD-10-CM

## 2023-04-09 DIAGNOSIS — M2042 Other hammer toe(s) (acquired), left foot: Secondary | ICD-10-CM | POA: Diagnosis not present

## 2023-04-09 NOTE — Progress Notes (Unsigned)
 Chief Complaint  Patient presents with   Callouses    NP, Right foot, 4th toe. Callous is on the top of the hammertoe. It is painful to walk. Referred over by Dr. Sedalia Muta. Not diabetic and takes ASA 81    HPI: 78 y.o. female presents today for painful hammertoe deformities.  Due to the deformity, she develops painful callus under the digits.  Right dorsal fourth toe and ends of the second and third toes left foot most notably.  She states that she is prediabetic.  She takes daily aspirin 81 mg.  Past Medical History:  Diagnosis Date   Anxiety    Closed fracture of phalanx of foot 12/21/2019   Depression    GERD (gastroesophageal reflux disease)    Hyperlipidemia    Prediabetes    Sepsis secondary to UTI (HCC) 11/27/2022   Sleep apnea    uses CPAP every night    Past Surgical History:  Procedure Laterality Date   CESAREAN SECTION     DIAGNOSTIC LAPAROSCOPY     FLAT FOOT RECONSTRUCTION-TAL GASTROC RECESSION Right 09/25/2021   In [place], [place]   HERNIA REPAIR     JOINT REPLACEMENT     knee replacements bilat   JOINT REPLACEMENT     hip replacements bilat   LIPOMA EXCISION     Dr Lequita Halt   ORIF ORBITAL FRACTURE Right 12/19/2018   Procedure: OPEN TREATMENT RIGHT ORBITAL FLOOR WITH IMPLANT, PERIORBITAL APPROACH;  Surgeon: Glenna Fellows, MD;  Location: [facility];  Service: Plastics;  Laterality: Right;   ORIF ORBITAL FRACTURE Right 02/03/2019   Procedure: OPEN TREATMENT RIGHT ORBITAL FLOOR FRACTURE REVISION, PERI ORBITAL APPROACH;  Surgeon: Glenna Fellows, MD;  Location: [facility];  Service: Plastics;  Laterality: Right;    Allergies  Allergen Reactions   Adhesive [Tape]     Red whelps     ROS denies any nausea, vomiting, fever, chills, chest pain, shortness   Physical Exam: There were no vitals filed for this visit.  General: The patient is alert and oriented x3 in no acute distress.  Dermatology: Skin is warm, dry and supple  bilateral lower extremities. Interspaces are clear of maceration and debris.  Preulcerative corn present to dorsal right fourth toe, distal tuft of left second and third toe  Vascular: Palpable pedal pulses bilaterally. Capillary refill within normal limits.  No appreciable edema.  No erythema or calor.  Neurological: Light touch sensation grossly intact bilateral feet.   Musculoskeletal Exam: Hammertoe contractures of lesser digits bilaterally.  Digits are semireducible, cannot be brought out all the way to neutral position.  No pain to the MPJs or PIPJ's himself with range of motion however pain associated with the calluses.  Left foot hallux malleus noted.  Radiographic Exam: Left and right foot 3 views weightbearing Normal osseous mineralization. Joint spaces preserved.  No fractures or acute sseous irregularities noted.  Hammertoe contractures lesser digits appreciated bilaterally.  Left foot hallux limitus noted.  Right foot retained surgical hardware from prior [place], double arthrodesis noted  Assessment/Plan of Care: 1. Acquired hammertoes of both feet   2. Corns and callosities      No orders of the defined types were placed in this encounter.  None  Discussed clinical findings with patient today.  Radiographs reviewed with patient.  Hammertoe deformities discussed with patient.  Reviewed surgical and conservative management options including shoe gear modifications, using appropriate shoe gear with appropriately hide with in the toebox  with soft upper material.  Discussed accommodative padding's as well.  Did discuss percutaneous flexor tenotomy for the affected digits.  We discussed that this procedure has the advantage of being minimally invasive with relatively short recovery time, however did discuss with patient that she may see limited results or recurrence due to the toes not being fully reducible.  I do think that based on the examination, they are reducible enough to  alleviate the painful calluses if she wishes to proceed.  Crest pads dispensed the patient today.  She does seem interested in the procedure.  Follow-up in 2 to 3 weeks if she would like to proceed with this, instructed patient to hold.  Aspirin 81 mg for 5 to 7 days for her follow-up visit.   Trenace Coughlin L. Marchia Bond, AACFAS [facility]     [address] [place], [place] [address]                Office [phone]  Fax [phone]

## 2023-04-10 NOTE — Progress Notes (Unsigned)
 Subjective:  Patient ID: Connie Marks, female    DOB: 29-Mar-1945  Age: 78 y.o. MRN: [id number]  Chief Complaint  Patient presents with   Medical Management of Chronic Issues    Discussed the use of AI scribe software for clinical note transcription with the patient, who gave verbal consent to proceed.  History of Present Illness   Connie Marks, a patient with a history of depression and diabetes, presents for a regular check-up. She reports feeling unwell for the past two weeks, but does not provide specific symptoms. She expresses concern about her diabetes control, acknowledging recent dietary indiscretions. She is not on any medication for diabetes, and her last A1c was 6.3.  Regarding her depression, Connie Marks reports a lack of motivation and difficulty completing tasks. She spends her time listening to books and doing handwork, but struggles to find the energy to clean or organize her surroundings. She is currently on citalopram and Wellbutrin for her depression.  Connie Marks also mentions a painful corn due to hammer toes. She has been given some devices to help straighten her toes and cushion the corn, but is considering a procedure to cut the back of the toe to reduce the rubbing that causes the corn.      Hyperlipidemia: She is currently taking simvastatin 40 mg daily.  GERD: Takes Lansoprazole 30 mg daily, Famotidine 40 mg at bedtime. Takes otc Depression: Patient is taking Wellbutrin SR 150 mg twice daily, Celexa 40 mg daily. Dose of citalopram is higher than recommended for her age.  OSA: on CPAP.      04/11/2023    9:03 AM 01/09/2023    9:54 AM 11/27/2022    9:10 AM 09/14/2022   10:01 AM 04/11/2022    9:39 AM  Depression screen PHQ 2/9  Decreased Interest 1 1 0 1 0  Down, Depressed, Hopeless 0 0 0 0 0  PHQ - 2 Score 1 1 0 1 0  Altered sleeping 2 1 0 0 0  Tired, decreased energy 1 2 0 0 1  Change in appetite 0 0 0 0 0  Feeling bad or failure about yourself  1 0 0 0 0  Trouble  concentrating 0 0 0 0 0  Moving slowly or fidgety/restless 0 0 0 0 0  Suicidal thoughts 0 0 0 0 0  PHQ-9 Score 5 4 0 1 1  Difficult doing work/chores Somewhat difficult Somewhat difficult Not difficult at all Somewhat difficult Not difficult at all        11/27/2022    9:10 AM  Fall Risk   Falls in the past year? 0  Number falls in past yr: 0  Injury with Fall? 0  Risk for fall due to : No Fall Risks  Follow up Falls evaluation completed    Patient Care Team: Blane Ohara, MD as PCP - General (Internal Medicine) Sallye Lat, MD as Consulting Physician (Ophthalmology) Toni Arthurs, MD as Consulting Physician (Orthopedic Surgery)   Review of Systems  Constitutional:  Negative for chills, fatigue and fever.  HENT:  Positive for congestion. Negative for ear pain, rhinorrhea and sore throat.   Respiratory:  Negative for cough and shortness of breath.   Cardiovascular:  Negative for chest pain.  Gastrointestinal:  Negative for abdominal pain, constipation, diarrhea, nausea and vomiting.  Genitourinary:  Negative for dysuria and urgency.  Musculoskeletal:  Negative for back pain and myalgias.  Neurological:  Negative for dizziness, weakness, light-headedness and headaches.  Psychiatric/Behavioral:  Negative for dysphoric mood.  The patient is not nervous/anxious.     Current Outpatient Medications on File Prior to Visit  Medication Sig Dispense Refill   aspirin EC 81 MG tablet Take 81 mg by mouth daily. Swallow whole.     buPROPion (WELLBUTRIN SR) 150 MG 12 hr tablet Take 1 tablet (150 mg total) by mouth 2 (two) times daily. 180 tablet 1   Calcium Carbonate+Vitamin D 600-200 MG-UNIT TABS Take 1 tablet by mouth daily.     Carboxymethylcellulose Sod PF (REFRESH PLUS) 0.5 % SOLN      famotidine (PEPCID) 40 MG tablet Take 40 mg by mouth at bedtime.     lansoprazole (PREVACID) 30 MG capsule Take 1 capsule (30 mg total) by mouth daily at 12 noon. 90 capsule 3   Multiple  Vitamins-Minerals (MULTIVITAL PO) Take by mouth.     simvastatin (ZOCOR) 40 MG tablet TAKE ONE TABLET BY MOUTH ONCE DAILY 90 tablet 0   traZODone (DESYREL) 50 MG tablet TAKE 1/2 TO 1 TABLET BY MOUTH AT BEDTIME AS NEEDED FOR SLEEP 90 tablet 1   valACYclovir (VALTREX) 1000 MG tablet Take 2 tablets (2,000 mg total) by mouth 2 (two) times daily at onset 24 tablet 0   No current facility-administered medications on file prior to visit.   Past Medical History:  Diagnosis Date   Anxiety    Closed fracture of phalanx of foot 12/21/2019   Depression    GERD (gastroesophageal reflux disease)    Hyperlipidemia    Prediabetes    Sepsis secondary to UTI (HCC) 11/27/2022   Sleep apnea    uses CPAP every night   Past Surgical History:  Procedure Laterality Date   CESAREAN SECTION     DIAGNOSTIC LAPAROSCOPY     FLAT FOOT RECONSTRUCTION-TAL GASTROC RECESSION Right 09/25/2021   In [place], [place]   HERNIA REPAIR     JOINT REPLACEMENT     knee replacements bilat   JOINT REPLACEMENT     hip replacements bilat   LIPOMA EXCISION     Dr Lequita Halt   ORIF ORBITAL FRACTURE Right 12/19/2018   Procedure: OPEN TREATMENT RIGHT ORBITAL FLOOR WITH IMPLANT, PERIORBITAL APPROACH;  Surgeon: Glenna Fellows, MD;  Location: [facility];  Service: Plastics;  Laterality: Right;   ORIF ORBITAL FRACTURE Right 02/03/2019   Procedure: OPEN TREATMENT RIGHT ORBITAL FLOOR FRACTURE REVISION, PERI ORBITAL APPROACH;  Surgeon: Glenna Fellows, MD;  Location: [facility];  Service: Plastics;  Laterality: Right;    Family History  Problem Relation Age of Onset   Diabetes Father    COPD Father    Pancreatic cancer Maternal Grandmother    Breast cancer Neg Hx    Social History   Socioeconomic History   Marital status: Divorced    Spouse name: Not on file   Number of children: 2   Years of education: Not on file   Highest education level: Bachelor's degree (e.g., BA, AB, BS)  Occupational  History   Not on file  Tobacco Use   Smoking status: Former    Current packs/day: 0.00    Average packs/day: 1 pack/day for 15.0 years (15.0 ttl pk-yrs)    Types: Cigarettes    Start date: 12/19/1970    Quit date: 12/18/1985    Years since quitting: 37.3   Smokeless tobacco: Never  Vaping Use   Vaping status: Never Used  Substance and Sexual Activity   Alcohol use: Yes    Alcohol/week: 3.0 standard drinks of alcohol  Types: 2 Glasses of wine, 1 Shots of liquor per week    Comment: 2 glasses /week   Drug use: Never   Sexual activity: Not Currently  Other Topics Concern   Not on file  Social History Narrative   Daughter #1 - lives in [place], Daughter #2 - lives in [place], mother lives in [place]    Social Drivers of Health   Financial Resource Strain: Medium Risk (04/10/2023)   Overall Financial Resource Strain (CARDIA)    Difficulty of Paying Living Expenses: Somewhat hard  Food Insecurity: No Food Insecurity (04/10/2023)   Hunger Vital Sign    Worried About Running Out of Food in the Last Year: Never true    Ran Out of Food in the Last Year: Never true  Transportation Needs: No Transportation Needs (04/10/2023)   PRAPARE - Administrator, Civil Service (Medical): No    Lack of Transportation (Non-Medical): No  Physical Activity: Inactive (04/10/2023)   Exercise Vital Sign    Days of Exercise per Week: 0 days    Minutes of Exercise per Session: 10 min  Stress: No Stress Concern Present (04/10/2023)   [employer] of Occupational Health - Occupational Stress Questionnaire    Feeling of Stress : Only a little  Social Connections: Moderately Isolated (04/10/2023)   Social Connection and Isolation Panel [NHANES]    Frequency of Communication with Friends and Family: More than three times a week    Frequency of Social Gatherings with Friends and Family: Twice a week    Attends Religious Services: Never    Diplomatic Services operational officer: No    Attends Museum/gallery exhibitions officer: More than 4 times per year    Marital Status: Divorced    Objective:  BP 122/82   Pulse 93   Temp 97.8 F (36.6 C)   Ht 5\' 4"  (1.626 m)   Wt 214 lb (97.1 kg)   SpO2 98%   BMI 36.73 kg/m      04/11/2023    9:02 AM 01/09/2023    9:52 AM 11/27/2022    9:11 AM  BP/Weight  Systolic BP 122 120 110  Diastolic BP 82 68 74  Wt. (Lbs) 214 209 212.2  BMI 36.73 kg/m2 35.87 kg/m2 36.42 kg/m2    Physical Exam Vitals reviewed.  Constitutional:      Appearance: Normal appearance. She is obese.  Neck:     Vascular: No carotid bruit.  Cardiovascular:     Rate and Rhythm: Normal rate and regular rhythm.     Heart sounds: Normal heart sounds.  Pulmonary:     Effort: Pulmonary effort is normal. No respiratory distress.     Breath sounds: Normal breath sounds.  Abdominal:     General: Abdomen is flat. Bowel sounds are normal.     Palpations: Abdomen is soft.     Tenderness: There is no abdominal tenderness.  Neurological:     Mental Status: She is alert and oriented to person, place, and time.  Psychiatric:        Mood and Affect: Mood normal.        Behavior: Behavior normal.     Diabetic Foot Exam - Simple   Simple Foot Form  04/11/2023  8:09 PM  Visual Inspection Sensation Testing Pulse Check Comments Podiatry exam from [facility].  Dermatology: Skin is warm, dry and supple bilateral lower extremities. Interspaces are clear of maceration and debris.  Preulcerative corn present to dorsal right fourth toe,  distal tuft of left second and third toe   Vascular: Palpable pedal pulses bilaterally. Capillary refill within normal limits.  No appreciable edema.  No erythema or calor.   Neurological: Light touch sensation grossly intact bilateral feet.    Musculoskeletal Exam: Hammertoe contractures of lesser digits bilaterally.  Digits are semireducible, cannot be brought out all the way to neutral position.  No pain to the MPJs or PIPJ's himself with range of motion  however pain associated with the calluses.  Left foot hallux malleus noted.   Radiographic Exam: Left and right foot 3 views weightbearing Normal osseous mineralization. Joint spaces preserved.  No fractures or acute sseous irregularities noted.  Hammertoe contractures lesser digits appreciated bilaterally.  Left foot hallux limitus noted.  Right foot retained surgical hardware from prior [place], double arthrodesis noted         Lab Results  Component Value Date   WBC 9.0 04/11/2023   HGB 10.5 (L) 04/11/2023   HCT 32.8 (L) 04/11/2023   PLT 271 04/11/2023   GLUCOSE 118 (H) 04/11/2023   CHOL 183 04/11/2023   TRIG 134 04/11/2023   HDL 56 04/11/2023   LDLCALC 103 (H) 04/11/2023   ALT 15 04/11/2023   AST 18 04/11/2023   NA 142 04/11/2023   K 5.5 (H) 04/11/2023   CL 105 04/11/2023   CREATININE 1.18 (H) 04/11/2023   BUN 21 04/11/2023   CO2 20 04/11/2023   HGBA1C 6.4 (H) 04/11/2023      Assessment & Plan:  Mixed hyperlipidemia Assessment & Plan: The current medical regimen is effective;  continue present plan and medications. On Simvastatin for cholesterol management. -Continue Simvastatin as prescribed.  Orders: -     Lipid panel  RLS (restless legs syndrome) Assessment & Plan: The current medical regimen is effective;  continue present plan and medications. Continue mirapex 0.5 mg one three times a day.   Orders: -     Pramipexole Dihydrochloride; Take 1 tablet (0.5 mg total) by mouth 3 (three) times daily.  Dispense: 90 tablet; Refill: 1  Combined hyperlipidemia associated with type 2 diabetes mellitus (HCC) Assessment & Plan: Diabetes well controlled. Last A1c was 6.3, and you are not currently on any diabetes medications. We discussed the importance of monitoring your blood glucose levels, especially after recent dietary indiscretions. Continue with your current management plan.   Orders: -     CBC with Differential/Platelet -     Comprehensive metabolic  panel -     Hemoglobin A1c -     Microalbumin / creatinine urine ratio  Gastroesophageal reflux disease without esophagitis Assessment & Plan: The current medical regimen is effective;  continue present plan and medications. Continue Lansoprazole 30 mg  daily, Famotidine 40 mg at bedtime    Hammer toes of both feet Assessment & Plan: Patient mentioned pain from corns due to this condition. We discussed the possibility of surgical intervention as suggested by your podiatrist.   OSA on CPAP Assessment & Plan: Continue using CPAP as directed.     Mild episode of recurrent major depressive disorder [facility]) Assessment & Plan: Patient reported a lack of motivation and difficulty completing tasks.  - reduce Citalopram dose to 20mg  daily due to age-related risks and discussed the potential benefits of counseling. Continue wellbutrin sr 150 mg twice daily.   Orders: -     Citalopram Hydrobromide; Take 1 tablet (20 mg total) by mouth daily.  Dispense: 90 tablet; Refill: 0  Visit for screening mammogram -  3D Screening Mammogram, Left and Right; Future            Meds ordered this encounter  Medications   citalopram (CELEXA) 20 MG tablet    Sig: Take 1 tablet (20 mg total) by mouth daily.    Dispense:  90 tablet    Refill:  0   pramipexole (MIRAPEX) 0.5 MG tablet    Sig: Take 1 tablet (0.5 mg total) by mouth 3 (three) times daily.    Dispense:  90 tablet    Refill:  1    Orders Placed This Encounter  Procedures   MM 3D SCREENING MAMMOGRAM BILATERAL BREAST   CBC with Differential/Platelet   Comprehensive metabolic panel   Hemoglobin A1c   Lipid panel   Microalbumin / creatinine urine ratio     Follow-up: Return in about 3 months (around 07/12/2023) for chronic follow up, awv with KIM in person please sometime in the next couple of months. Clayborn Bigness I Leal-Borjas,acting as a scribe for Blane Ohara, MD.,have documented all relevant documentation on the behalf of  Blane Ohara, MD,as directed by  Blane Ohara, MD while in the presence of Blane Ohara, MD.   An After Visit Summary was printed and given to the patient.  I attest that I have reviewed this visit and agree with the plan scribed by my staff.   Blane Ohara, MD [facility] [phone]    Blane Ohara, MD [facility] [phone]

## 2023-04-11 ENCOUNTER — Ambulatory Visit: Payer: Medicare Other | Admitting: Family Medicine

## 2023-04-11 ENCOUNTER — Encounter: Payer: Self-pay | Admitting: Family Medicine

## 2023-04-11 VITALS — BP 122/82 | HR 93 | Temp 97.8°F | Ht 64.0 in | Wt 214.0 lb

## 2023-04-11 DIAGNOSIS — G4733 Obstructive sleep apnea (adult) (pediatric): Secondary | ICD-10-CM | POA: Diagnosis not present

## 2023-04-11 DIAGNOSIS — K219 Gastro-esophageal reflux disease without esophagitis: Secondary | ICD-10-CM

## 2023-04-11 DIAGNOSIS — E782 Mixed hyperlipidemia: Secondary | ICD-10-CM

## 2023-04-11 DIAGNOSIS — M2041 Other hammer toe(s) (acquired), right foot: Secondary | ICD-10-CM

## 2023-04-11 DIAGNOSIS — E1169 Type 2 diabetes mellitus with other specified complication: Secondary | ICD-10-CM | POA: Diagnosis not present

## 2023-04-11 DIAGNOSIS — M2042 Other hammer toe(s) (acquired), left foot: Secondary | ICD-10-CM | POA: Diagnosis not present

## 2023-04-11 DIAGNOSIS — Z1231 Encounter for screening mammogram for malignant neoplasm of breast: Secondary | ICD-10-CM

## 2023-04-11 DIAGNOSIS — F330 Major depressive disorder, recurrent, mild: Secondary | ICD-10-CM

## 2023-04-11 DIAGNOSIS — G2581 Restless legs syndrome: Secondary | ICD-10-CM | POA: Diagnosis not present

## 2023-04-11 MED ORDER — PRAMIPEXOLE DIHYDROCHLORIDE 0.5 MG PO TABS: 0.5000 mg | ORAL_TABLET | Freq: Three times a day (TID) | ORAL | 1 refills | Status: DC

## 2023-04-11 MED ORDER — CITALOPRAM HYDROBROMIDE 20 MG PO TABS
20.0000 mg | ORAL_TABLET | Freq: Every day | ORAL | 0 refills | Status: DC
Start: 2023-04-11 — End: 2023-11-15

## 2023-04-11 NOTE — Patient Instructions (Signed)
 VISIT SUMMARY:  Today, you came in for a regular check-up. We discussed your concerns about feeling unwell, your diabetes management, depression, and the pain from your hammer toes. We also reviewed your current medications and general health maintenance.  YOUR PLAN:  -DEPRESSION: Depression is a mental health condition characterized by persistent feelings of sadness and loss of interest. You reported a lack of motivation and difficulty completing tasks. We decided to reduce your Citalopram dose to 20mg  daily due to age-related risks and discussed the potential benefits of counseling.  -DIABETES: Diabetes is a condition that affects how your body processes blood sugar. Your last A1c was 6.3, and you are not currently on any diabetes medications. We discussed the importance of monitoring your blood glucose levels, especially after recent dietary indiscretions. Continue with your current management plan.  -HYPERLIPIDEMIA: Hyperlipidemia is a condition where there are high levels of fats (lipids) in the blood. You are currently managing this with Simvastatin. Continue taking Simvastatin as prescribed.  -SLEEP APNEA: Sleep apnea is a sleep disorder where breathing repeatedly stops and starts. You reported consistent use of your CPAP machine and find it beneficial. Continue using your CPAP as directed.  -HAMMER TOES: Hammer toes are a deformity that causes your toes to bend or curl downward. You mentioned pain from corns due to this condition. We discussed the possibility of surgical intervention as suggested by your podiatrist.  -GENERAL HEALTH MAINTENANCE: We reviewed your general health maintenance. Please provide a urine sample today, reschedule your mammogram, consider getting the Shingles vaccine (discuss this with your pharmacy), and schedule your Medicare annual wellness visit with Miss Selena Batten.  INSTRUCTIONS:  Please follow up on the following: provide a urine sample today, reschedule your  mammogram, consider the Shingles vaccine by discussing it with your pharmacy, and schedule your Medicare annual wellness visit with Creola Corn, LPN.  For more information, you can read your full clinical note, available in your patient portal.

## 2023-04-11 NOTE — Progress Notes (Deleted)
 Acute Office Visit  Subjective:    Patient ID: Connie Marks, female    DOB: 11/05/1945, 78 y.o.   MRN: [id number]  Chief Complaint  Patient presents with   Medical Management of Chronic Issues    Discussed the use of AI scribe software for clinical note transcription with the patient, who gave verbal consent to proceed.  History of Present Illness            HPI: Patient is in today for ***  Past Medical History:  Diagnosis Date   Anxiety    Closed fracture of phalanx of foot 12/21/2019   Depression    GERD (gastroesophageal reflux disease)    Hyperlipidemia    Prediabetes    Sepsis secondary to UTI (HCC) 11/27/2022   Sleep apnea    uses CPAP every night    Past Surgical History:  Procedure Laterality Date   CESAREAN SECTION     DIAGNOSTIC LAPAROSCOPY     FLAT FOOT RECONSTRUCTION-TAL GASTROC RECESSION Right 09/25/2021   In [place], [place]   HERNIA REPAIR     JOINT REPLACEMENT     knee replacements bilat   JOINT REPLACEMENT     hip replacements bilat   LIPOMA EXCISION     Dr Lequita Halt   ORIF ORBITAL FRACTURE Right 12/19/2018   Procedure: OPEN TREATMENT RIGHT ORBITAL FLOOR WITH IMPLANT, PERIORBITAL APPROACH;  Surgeon: Glenna Fellows, MD;  Location: [facility];  Service: Plastics;  Laterality: Right;   ORIF ORBITAL FRACTURE Right 02/03/2019   Procedure: OPEN TREATMENT RIGHT ORBITAL FLOOR FRACTURE REVISION, PERI ORBITAL APPROACH;  Surgeon: Glenna Fellows, MD;  Location: [facility];  Service: Plastics;  Laterality: Right;    Family History  Problem Relation Age of Onset   Diabetes Father    COPD Father    Pancreatic cancer Maternal Grandmother    Breast cancer Neg Hx     Social History   Socioeconomic History   Marital status: Divorced    Spouse name: Not on file   Number of children: 2   Years of education: Not on file   Highest education level: Bachelor's degree (e.g., BA, AB, BS)  Occupational History   Not on  file  Tobacco Use   Smoking status: Former    Current packs/day: 0.00    Average packs/day: 1 pack/day for 15.0 years (15.0 ttl pk-yrs)    Types: Cigarettes    Start date: 12/19/1970    Quit date: 12/18/1985    Years since quitting: 37.3   Smokeless tobacco: Never  Vaping Use   Vaping status: Never Used  Substance and Sexual Activity   Alcohol use: Yes    Alcohol/week: 3.0 standard drinks of alcohol    Types: 2 Glasses of wine, 1 Shots of liquor per week    Comment: 2 glasses /week   Drug use: Never   Sexual activity: Not Currently  Other Topics Concern   Not on file  Social History Narrative   Daughter #1 - lives in [place], Daughter #2 - lives in [place], mother lives in [place]    Social Drivers of Health   Financial Resource Strain: Medium Risk (04/10/2023)   Overall Financial Resource Strain (CARDIA)    Difficulty of Paying Living Expenses: Somewhat hard  Food Insecurity: No Food Insecurity (04/10/2023)   Hunger Vital Sign    Worried About Running Out of Food in the Last Year: Never true    Ran Out of Food in the Last Year: Never  true  Transportation Needs: No Transportation Needs (04/10/2023)   PRAPARE - Administrator, Civil Service (Medical): No    Lack of Transportation (Non-Medical): No  Physical Activity: Inactive (04/10/2023)   Exercise Vital Sign    Days of Exercise per Week: 0 days    Minutes of Exercise per Session: 10 min  Stress: No Stress Concern Present (04/10/2023)   [employer] of Occupational Health - Occupational Stress Questionnaire    Feeling of Stress : Only a little  Social Connections: Moderately Isolated (04/10/2023)   Social Connection and Isolation Panel [NHANES]    Frequency of Communication with Friends and Family: More than three times a week    Frequency of Social Gatherings with Friends and Family: Twice a week    Attends Religious Services: Never    Database administrator or Organizations: No    Attends Hospital doctor: More than 4 times per year    Marital Status: Divorced  Catering manager Violence: Not At Risk (04/11/2022)   Humiliation, Afraid, Rape, and Kick questionnaire    Fear of Current or Ex-Partner: No    Emotionally Abused: No    Physically Abused: No    Sexually Abused: No    Outpatient Medications Prior to Visit  Medication Sig Dispense Refill   aspirin EC 81 MG tablet Take 81 mg by mouth daily. Swallow whole.     buPROPion (WELLBUTRIN SR) 150 MG 12 hr tablet Take 1 tablet (150 mg total) by mouth 2 (two) times daily. 180 tablet 1   Calcium Carbonate+Vitamin D 600-200 MG-UNIT TABS Take 1 tablet by mouth daily.     Carboxymethylcellulose Sod PF (REFRESH PLUS) 0.5 % SOLN      citalopram (CELEXA) 40 MG tablet Take 1 tablet (40 mg total) by mouth daily. 90 tablet 1   famotidine (PEPCID) 40 MG tablet Take 40 mg by mouth at bedtime.     lansoprazole (PREVACID) 30 MG capsule Take 1 capsule (30 mg total) by mouth daily at 12 noon. 90 capsule 3   Multiple Vitamins-Minerals (MULTIVITAL PO) Take by mouth.     pramipexole (MIRAPEX) 0.5 MG tablet TAKE ONE TABLET BY MOUTH THREE TIMES DAILY 90 tablet 1   simvastatin (ZOCOR) 40 MG tablet TAKE ONE TABLET BY MOUTH ONCE DAILY 90 tablet 0   traZODone (DESYREL) 50 MG tablet TAKE 1/2 TO 1 TABLET BY MOUTH AT BEDTIME AS NEEDED FOR SLEEP 90 tablet 1   valACYclovir (VALTREX) 1000 MG tablet Take 2 tablets (2,000 mg total) by mouth 2 (two) times daily at onset 24 tablet 0   No facility-administered medications prior to visit.    Allergies  Allergen Reactions   Adhesive [Tape]     Red whelps     Review of Systems     Objective:        04/11/2023    9:02 AM 01/09/2023    9:52 AM 11/27/2022    9:11 AM  Vitals with BMI  Height 5\' 4"  5\' 4"  5\' 4"   Weight 214 lbs 209 lbs 212 lbs 3 oz  BMI 36.72 35.86 36.41  Systolic 122 120 644  Diastolic 82 68 74  Pulse 93 68 72    No data found.   Physical Exam  Health Maintenance Due  Topic Date Due    Zoster Vaccines- Shingrix (1 of 2) 10/04/1964   OPHTHALMOLOGY EXAM  09/12/2022   COVID-19 Vaccine (6 - 2024-25 season) 03/06/2023   Medicare Annual Wellness (AWV)  04/11/2023    There are no preventive care reminders to display for this patient.   No results found for: "TSH" Lab Results  Component Value Date   WBC 8.1 01/09/2023   HGB 11.0 (L) 01/09/2023   HCT 35.0 01/09/2023   MCV 84 01/09/2023   PLT 276 01/09/2023   Lab Results  Component Value Date   NA 142 01/09/2023   K 5.0 01/09/2023   CO2 22 01/09/2023   GLUCOSE 101 (H) 01/09/2023   BUN 21 01/09/2023   CREATININE 1.22 (H) 01/09/2023   BILITOT 0.5 01/09/2023   ALKPHOS 68 01/09/2023   AST 21 01/09/2023   ALT 17 01/09/2023   PROT 6.8 01/09/2023   ALBUMIN 4.5 01/09/2023   CALCIUM 9.4 01/09/2023   EGFR 46 (L) 01/09/2023   Lab Results  Component Value Date   CHOL 173 01/09/2023   Lab Results  Component Value Date   HDL 55 01/09/2023   Lab Results  Component Value Date   LDLCALC 95 01/09/2023   Lab Results  Component Value Date   TRIG 128 01/09/2023   Lab Results  Component Value Date   CHOLHDL 3.1 01/09/2023   Lab Results  Component Value Date   HGBA1C 6.3 (H) 01/09/2023       Assessment & Plan:  Mixed hyperlipidemia  RLS (restless legs syndrome)  Combined hyperlipidemia associated with type 2 diabetes mellitus (HCC)  Gastroesophageal reflux disease without esophagitis  OSA on CPAP  Mild episode of recurrent major depressive disorder (HCC)     Assessment and Plan               No orders of the defined types were placed in this encounter.   No orders of the defined types were placed in this encounter.    Follow-up: No follow-ups on file.  An After Visit Summary was printed and given to the patient.  Blane Ohara, MD [facility] [phone]

## 2023-04-12 LAB — COMPREHENSIVE METABOLIC PANEL
ALT: 15 IU/L (ref 0–32)
AST: 18 IU/L (ref 0–40)
Albumin: 4.1 g/dL (ref 3.8–4.8)
Alkaline Phosphatase: 70 IU/L (ref 44–121)
BUN/Creatinine Ratio: 18 (ref 12–28)
BUN: 21 mg/dL (ref 8–27)
Bilirubin Total: 0.3 mg/dL (ref 0.0–1.2)
CO2: 20 mmol/L (ref 20–29)
Calcium: 9.8 mg/dL (ref 8.7–10.3)
Chloride: 105 mmol/L (ref 96–106)
Creatinine, Ser: 1.18 mg/dL — ABNORMAL HIGH (ref 0.57–1.00)
Globulin, Total: 2.7 g/dL (ref 1.5–4.5)
Glucose: 118 mg/dL — ABNORMAL HIGH (ref 70–99)
Potassium: 5.5 mmol/L — ABNORMAL HIGH (ref 3.5–5.2)
Sodium: 142 mmol/L (ref 134–144)
Total Protein: 6.8 g/dL (ref 6.0–8.5)
eGFR: 48 mL/min/{1.73_m2} — ABNORMAL LOW (ref >59)

## 2023-04-12 LAB — LIPID PANEL
Chol/HDL Ratio: 3.3 ratio (ref 0.0–4.4)
Cholesterol, Total: 183 mg/dL (ref 100–199)
HDL: 56 mg/dL (ref >39)
LDL Chol Calc (NIH): 103 mg/dL — ABNORMAL HIGH (ref 0–99)
Triglycerides: 134 mg/dL (ref 0–149)
VLDL Cholesterol Cal: 24 mg/dL (ref 5–40)

## 2023-04-12 LAB — CBC WITH DIFFERENTIAL/PLATELET
Basophils Absolute: 0.1 10*3/uL (ref 0.0–0.2)
Basos: 1 %
EOS (ABSOLUTE): 0.2 10*3/uL (ref 0.0–0.4)
Eos: 2 %
Hematocrit: 32.8 % — ABNORMAL LOW (ref 34.0–46.6)
Hemoglobin: 10.5 g/dL — ABNORMAL LOW (ref 11.1–15.9)
Immature Grans (Abs): 0 10*3/uL (ref 0.0–0.1)
Immature Granulocytes: 0 %
Lymphocytes Absolute: 1.8 10*3/uL (ref 0.7–3.1)
Lymphs: 20 %
MCH: 26.1 pg — ABNORMAL LOW (ref 26.6–33.0)
MCHC: 32 g/dL (ref 31.5–35.7)
MCV: 82 fL (ref 79–97)
Monocytes Absolute: 0.8 10*3/uL (ref 0.1–0.9)
Monocytes: 9 %
Neutrophils Absolute: 6 10*3/uL (ref 1.4–7.0)
Neutrophils: 68 %
Platelets: 271 10*3/uL (ref 150–450)
RBC: 4.02 x10E6/uL (ref 3.77–5.28)
RDW: 15 % (ref 11.7–15.4)
WBC: 9 10*3/uL (ref 3.4–10.8)

## 2023-04-12 LAB — HEMOGLOBIN A1C
Est. average glucose Bld gHb Est-mCnc: 137 mg/dL
Hgb A1c MFr Bld: 6.4 % — ABNORMAL HIGH (ref 4.8–5.6)

## 2023-04-12 LAB — MICROALBUMIN / CREATININE URINE RATIO
Creatinine, Urine: 49.3 mg/dL
Microalb/Creat Ratio: 14 mg/g{creat} (ref 0–29)
Microalbumin, Urine: 7 ug/mL

## 2023-04-13 DIAGNOSIS — M2041 Other hammer toe(s) (acquired), right foot: Secondary | ICD-10-CM | POA: Insufficient documentation

## 2023-04-13 NOTE — Assessment & Plan Note (Signed)
Continue using CPAP as directed

## 2023-04-13 NOTE — Assessment & Plan Note (Signed)
The current medical regimen is effective;  continue present plan and medications. Continue Lansoprazole 30 mg  daily, Famotidine 40 mg at bedtime

## 2023-04-13 NOTE — Assessment & Plan Note (Signed)
 Diabetes well controlled. Last A1c was 6.3, and you are not currently on any diabetes medications. We discussed the importance of monitoring your blood glucose levels, especially after recent dietary indiscretions. Continue with your current management plan.

## 2023-04-13 NOTE — Assessment & Plan Note (Signed)
 Patient reported a lack of motivation and difficulty completing tasks.  - reduce Citalopram dose to 20mg  daily due to age-related risks and discussed the potential benefits of counseling. Continue wellbutrin sr 150 mg twice daily.

## 2023-04-13 NOTE — Assessment & Plan Note (Signed)
The current medical regimen is effective;  continue present plan and medications. Continue mirapex 0.5 mg one three times a day.

## 2023-04-13 NOTE — Assessment & Plan Note (Signed)
 Patient mentioned pain from corns due to this condition. We discussed the possibility of surgical intervention as suggested by your podiatrist.

## 2023-04-13 NOTE — Assessment & Plan Note (Signed)
 The current medical regimen is effective;  continue present plan and medications. On Simvastatin for cholesterol management. -Continue Simvastatin as prescribed.

## 2023-04-14 ENCOUNTER — Encounter: Payer: Self-pay | Admitting: Family Medicine

## 2023-04-14 DIAGNOSIS — Z1231 Encounter for screening mammogram for malignant neoplasm of breast: Secondary | ICD-10-CM | POA: Insufficient documentation

## 2023-04-29 ENCOUNTER — Encounter: Payer: Self-pay | Admitting: Podiatry

## 2023-04-29 ENCOUNTER — Ambulatory Visit: Admitting: Podiatry

## 2023-04-29 DIAGNOSIS — M2042 Other hammer toe(s) (acquired), left foot: Secondary | ICD-10-CM

## 2023-04-29 DIAGNOSIS — M2041 Other hammer toe(s) (acquired), right foot: Secondary | ICD-10-CM

## 2023-04-29 DIAGNOSIS — L84 Corns and callosities: Secondary | ICD-10-CM

## 2023-04-29 MED ORDER — CLINDAMYCIN HCL 150 MG PO CAPS: 150.0000 mg | ORAL_CAPSULE | Freq: Three times a day (TID) | ORAL | 0 refills | Status: AC

## 2023-04-29 MED ORDER — OXYCODONE-ACETAMINOPHEN 5-325 MG PO TABS: 1.0000 | ORAL_TABLET | Freq: Four times a day (QID) | ORAL | 0 refills | Status: AC | PRN

## 2023-04-29 MED ORDER — ONDANSETRON HCL 4 MG PO TABS: 4.0000 mg | ORAL_TABLET | Freq: Three times a day (TID) | ORAL | 0 refills | Status: AC | PRN

## 2023-04-29 NOTE — Progress Notes (Unsigned)
 Chief Complaint  Patient presents with   Hammer Toe    Bilateral Hammer toes.  Tenotomy of the right toes today. Not diabetic and takes ASA 81    HPI: 78 y.o. female presents today for painful hammertoe deformities.  She is stating that she would like to proceed with flexor tenotomy today.  She would like to start with the right side as the right fourth toe is most painful though she does get pain from the second and third toes as well on the right side.  Past Medical History:  Diagnosis Date   Anxiety    Closed fracture of phalanx of foot 12/21/2019   Depression    GERD (gastroesophageal reflux disease)    Hyperlipidemia    Prediabetes    Sepsis secondary to UTI (HCC) 11/27/2022   Sleep apnea    uses CPAP every night    Past Surgical History:  Procedure Laterality Date   CESAREAN SECTION     DIAGNOSTIC LAPAROSCOPY     FLAT FOOT RECONSTRUCTION-TAL GASTROC RECESSION Right 09/25/2021   In [place], [place]   HERNIA REPAIR     JOINT REPLACEMENT     knee replacements bilat   JOINT REPLACEMENT     hip replacements bilat   LIPOMA EXCISION     Dr Lequita Halt   ORIF ORBITAL FRACTURE Right 12/19/2018   Procedure: OPEN TREATMENT RIGHT ORBITAL FLOOR WITH IMPLANT, PERIORBITAL APPROACH;  Surgeon: Glenna Fellows, MD;  Location: [facility];  Service: Plastics;  Laterality: Right;   ORIF ORBITAL FRACTURE Right 02/03/2019   Procedure: OPEN TREATMENT RIGHT ORBITAL FLOOR FRACTURE REVISION, PERI ORBITAL APPROACH;  Surgeon: Glenna Fellows, MD;  Location: [facility];  Service: Plastics;  Laterality: Right;    Allergies  Allergen Reactions   Adhesive [Tape]     Red whelps     ROS denies any nausea, vomiting, fever, chills, chest pain, shortness   Physical Exam: There were no vitals filed for this visit.  General: The patient is alert and oriented x3 in no acute distress.  Dermatology: Skin is warm, dry and supple bilateral lower extremities.  Interspaces are clear of maceration and debris.  Preulcerative corn present to dorsal right fourth toe, distal tuft of left second and third toe  Vascular: Palpable pedal pulses bilaterally. Capillary refill within normal limits.  No appreciable edema.  No erythema or calor.  Neurological: Light touch sensation grossly intact bilateral feet.   Musculoskeletal Exam: Hammertoe contractures of lesser digits bilaterally.  Right third and fourth toe can nearly be brought into rectus though the second toe is more rigid.  His toes are painful with associated callus to the fourth toe.  Assessment/Plan of Care: 1. Acquired hammertoes of both feet   2. Corns and callosities      Meds ordered this encounter  Medications   oxyCODONE-acetaminophen (PERCOCET/ROXICET) 5-325 MG tablet    Sig: Take 1 tablet by mouth every 6 (six) hours as needed for up to 20 days for severe pain (pain score 7-10).    Dispense:  20 tablet    Refill:  0   ondansetron (ZOFRAN) 4 MG tablet    Sig: Take 1 tablet (4 mg total) by mouth every 8 (eight) hours as needed for up to 7 days for nausea or vomiting.    Dispense:  20 tablet    Refill:  0   clindamycin (CLEOCIN) 150 MG capsule    Sig: Take 1 capsule (150 mg total)  by mouth 3 (three) times daily for 3 days.    Dispense:  9 capsule    Refill:  0   None  Discussed clinical findings with patient today.  Last visit we discussed flexor tenotomy's.  She would like to proceed with this.  We discussed the risk, benefits and alternative therapies.  Potential risks include pain, bleeding, infection, deformity, recurrence of the hammertoe, limp, need for further procedures.  Discussed that the goal of the procedure is to get the toes into a more straightened position to alleviate pain and offload calluses.  No particular did point out that there may likely be some second toe deformity as this toe is more rigid than the others that it can be extended somewhat.  She expressed good  understanding and wished to proceed.  The second, third, fourth toes of the right foot were anesthetized injecting a total of 6 cc of a one-to-one ratio of 1% lidocaine plain 0.5% Marcaine plain.  Betadine skin prep.  The second, third, fourth toes of the right foot were tenotomized percutaneously using an 18-gauge needle releasing the respective long and short flexor digitorum tendon.  Good improved position noted to the third and fourth toes, second toe is somewhat improved.  Tenotomy sites closed with Dermabond.  Toes splinted in rectus position using Steri-Strips, 4 x 4 gauze, Kling, Coban.  Instructed patient to keep dressings clean dry and intact.  Prescribing Percocet every 6 hours as needed for pain, Zofran for any associated nausea, 3 days of clindamycin as prophylaxis.  She may take over-the-counter NSAIDs as tolerated as well.  May weight-bear as tolerated in surgical shoe  Follow-up in 1 week   Tamela Elsayed L. Marchia Bond, AACFAS [facility]     [address] [place], [place] [address]                Office [phone]  Fax [phone]

## 2023-05-06 ENCOUNTER — Ambulatory Visit (INDEPENDENT_AMBULATORY_CARE_PROVIDER_SITE_OTHER)

## 2023-05-06 ENCOUNTER — Ambulatory Visit (INDEPENDENT_AMBULATORY_CARE_PROVIDER_SITE_OTHER): Admitting: Podiatry

## 2023-05-06 DIAGNOSIS — Z9889 Other specified postprocedural states: Secondary | ICD-10-CM

## 2023-05-06 NOTE — Progress Notes (Unsigned)
 Chief Complaint  Patient presents with   POV    POV 1 Xrays up, right foot. Doing well, very little pain that is controlled with tylenol. Not diabetic and take AS 81  Date of procedure: 04/29/2023 Procedure: Flexor tenotomy's of the second, third, fourth toes.  HPI: 78 y.o. female presents following right foot flexor tenotomy's of the second, third, fourth toes.  Past Medical History:  Diagnosis Date   Anxiety    Closed fracture of phalanx of foot 12/21/2019   Depression    GERD (gastroesophageal reflux disease)    Hyperlipidemia    Prediabetes    Sepsis secondary to UTI (HCC) 11/27/2022   Sleep apnea    uses CPAP every night    Past Surgical History:  Procedure Laterality Date   CESAREAN SECTION     DIAGNOSTIC LAPAROSCOPY     FLAT FOOT RECONSTRUCTION-TAL GASTROC RECESSION Right 09/25/2021   In [place], [place]   HERNIA REPAIR     JOINT REPLACEMENT     knee replacements bilat   JOINT REPLACEMENT     hip replacements bilat   LIPOMA EXCISION     Dr Lequita Halt   ORIF ORBITAL FRACTURE Right 12/19/2018   Procedure: OPEN TREATMENT RIGHT ORBITAL FLOOR WITH IMPLANT, PERIORBITAL APPROACH;  Surgeon: Glenna Fellows, MD;  Location: [facility];  Service: Plastics;  Laterality: Right;   ORIF ORBITAL FRACTURE Right 02/03/2019   Procedure: OPEN TREATMENT RIGHT ORBITAL FLOOR FRACTURE REVISION, PERI ORBITAL APPROACH;  Surgeon: Glenna Fellows, MD;  Location: [facility];  Service: Plastics;  Laterality: Right;    Allergies  Allergen Reactions   Adhesive [Tape]     Red whelps     ROS denies any nausea, vomiting, fever, chills, chest pain, shortness   Physical Exam: There were no vitals filed for this visit.  General: The patient is alert and oriented x3 in no acute distress.  Dermatology: Skin is warm, dry and supple bilateral lower extremities. Interspaces are clear of maceration and debris.  Third and fourth toe flexor tenotomy sites appear  well-healed.  Second toe flexor tenotomy site has some maceration about this without significant drainage, without erythema.  Vascular: Palpable pedal pulses bilaterally. Capillary refill within normal limits.  No appreciable edema.  No erythema or calor.  Neurological: Light touch sensation grossly intact bilateral feet.   Musculoskeletal Exam: Right foot improved rectus position with decreased flexion contracture of third and fourth hammertoe.  There does appear to be decreased pressure on the second hammertoe distal aspect though there is still rigid digital contracture.  Left foot hammertoe contractures of lesser digits with third and fourth toe being fairly reducible, second toe more rigid.  Radiographs: Right foot 3 views 05/06/2023 Hardware appreciated from prior subtalar joint, talonavicular joint, first TMT J fusion.  Hallux malleus and fifth toe hammertoe contracture noted.  Improved alignment of third and fourth hammertoes.  Some residual contracture noted to second toe.  Assessment/Plan of Care: 1. Post-operative state      No orders of the defined types were placed in this encounter.  None  Discussed clinical findings with patient today.  Second toe tenotomy site right foot painted with Betadine and skin glue applied.  Dressed with 4 x 4 gauze, Steri-Strip and Coban.  Advised patient to continue this for another week.  After this time she can transition back to regular shoe.  Follow-up in 2 weeks.  She may want to proceed with tenotomy's of some of  the left foot second toes.  Advised her to hold her Celebrex for 5 days prior to next visit to bring surgical shoe with her.   Percell Lamboy L. Marchia Bond, AACFAS [facility]     [address] [place], [place] [address]                Office [phone]  Fax [phone]

## 2023-05-08 ENCOUNTER — Encounter: Payer: Self-pay | Admitting: Podiatry

## 2023-05-14 ENCOUNTER — Telehealth: Payer: Self-pay | Admitting: Family Medicine

## 2023-05-14 NOTE — Telephone Encounter (Signed)
 [facility] DME Information Request

## 2023-05-16 ENCOUNTER — Telehealth: Payer: Self-pay | Admitting: Family Medicine

## 2023-05-16 ENCOUNTER — Ambulatory Visit: Admitting: Family Medicine

## 2023-05-16 ENCOUNTER — Encounter: Payer: Self-pay | Admitting: Family Medicine

## 2023-05-16 DIAGNOSIS — E1159 Type 2 diabetes mellitus with other circulatory complications: Secondary | ICD-10-CM

## 2023-05-16 DIAGNOSIS — I152 Hypertension secondary to endocrine disorders: Secondary | ICD-10-CM

## 2023-05-16 DIAGNOSIS — Z0000 Encounter for general adult medical examination without abnormal findings: Secondary | ICD-10-CM | POA: Diagnosis not present

## 2023-05-16 MED ORDER — SEMAGLUTIDE(0.25 OR 0.5MG/DOS) 2 MG/3ML ~~LOC~~ SOPN: 0.5000 mg | PEN_INJECTOR | SUBCUTANEOUS | 0 refills | Status: DC

## 2023-05-16 NOTE — Progress Notes (Signed)
 Subjective:   Connie Marks is a 78 y.o. female who presents for Medicare Annual (Subsequent) preventive examination.  Visit Complete: Virtual I connected with  Connie Marks on 05/16/23 by a audio enabled telemedicine application and verified that I am speaking with the correct person using two identifiers.  Patient Location: Home  Provider Location: Office/Clinic  I discussed the limitations of evaluation and management by telemedicine. The patient expressed understanding and agreed to proceed.  Vital Signs: Because this visit was a virtual/telehealth visit, some criteria may be missing or patient reported. Any vitals not documented were not able to be obtained and vitals that have been documented are patient reported.  Patient Medicare AWV questionnaire was completed by the patient on 05/16/2023; I have confirmed that all information answered by patient is correct and no changes since this date.  Cardiac Risk Factors include: diabetes mellitus     Objective:    There were no vitals filed for this visit. There is no height or weight on file to calculate BMI.     03/14/2021   11:19 AM 01/20/2020   10:11 AM 02/03/2019    8:49 AM 01/26/2019    2:07 PM 12/19/2018    6:48 AM 12/12/2018    2:07 PM  Advanced Directives  Does Patient Have a Medical Advance Directive? No No No No No No  Would patient like information on creating a medical advance directive? No - Patient declined  No - Patient declined  Yes (MAU/Ambulatory/Procedural Areas - Information given) No - Patient declined    Current Medications (verified) Outpatient Encounter Medications as of 05/16/2023  Medication Sig   aspirin EC 81 MG tablet Take 81 mg by mouth daily. Swallow whole.   buPROPion (WELLBUTRIN SR) 150 MG 12 hr tablet Take 1 tablet (150 mg total) by mouth 2 (two) times daily.   Calcium Carbonate+Vitamin D 600-200 MG-UNIT TABS Take 1 tablet by mouth daily.   Carboxymethylcellulose Sod PF (REFRESH  PLUS) 0.5 % SOLN    citalopram (CELEXA) 20 MG tablet Take 1 tablet (20 mg total) by mouth daily.   famotidine (PEPCID) 40 MG tablet Take 40 mg by mouth at bedtime.   lansoprazole (PREVACID) 30 MG capsule Take 1 capsule (30 mg total) by mouth daily at 12 noon.   Multiple Vitamins-Minerals (MULTIVITAL PO) Take by mouth.   oxyCODONE-acetaminophen (PERCOCET/ROXICET) 5-325 MG tablet Take 1 tablet by mouth every 6 (six) hours as needed for up to 20 days for severe pain (pain score 7-10).   pramipexole (MIRAPEX) 0.5 MG tablet Take 1 tablet (0.5 mg total) by mouth 3 (three) times daily.   Semaglutide,0.25 or 0.5MG /DOS, (OZEMPIC, 0.25 OR 0.5 MG/DOSE,) 2 MG/3ML SOPN Inject 0.25 mg into the skin daily.   simvastatin (ZOCOR) 40 MG tablet TAKE ONE TABLET BY MOUTH ONCE DAILY   traZODone (DESYREL) 50 MG tablet TAKE 1/2 TO 1 TABLET BY MOUTH AT BEDTIME AS NEEDED FOR SLEEP   valACYclovir (VALTREX) 1000 MG tablet Take 2 tablets (2,000 mg total) by mouth 2 (two) times daily at onset   No facility-administered encounter medications on file as of 05/16/2023.    Allergies (verified) Adhesive [tape]   History: Past Medical History:  Diagnosis Date   Allergy 07/1989   Foam tape welts   Anxiety    Arthritis 2009   Cataract 2022   Closed fracture of phalanx of foot 12/21/2019   Depression    GERD (gastroesophageal reflux disease)    Hyperlipidemia    Prediabetes  Sepsis secondary to UTI (HCC) 11/27/2022   Sleep apnea    uses CPAP every night   Past Surgical History:  Procedure Laterality Date   CESAREAN SECTION     DIAGNOSTIC LAPAROSCOPY     EYE SURGERY  2022   FLAT FOOT RECONSTRUCTION-TAL GASTROC RECESSION Right 09/25/2021   In [place], [place]   FRACTURE SURGERY  1960   HERNIA REPAIR     JOINT REPLACEMENT     knee replacements bilat   JOINT REPLACEMENT     hip replacements bilat   LIPOMA EXCISION     Dr Lequita Halt   ORIF ORBITAL FRACTURE Right 12/19/2018   Procedure: OPEN TREATMENT RIGHT ORBITAL  FLOOR WITH IMPLANT, PERIORBITAL APPROACH;  Surgeon: Glenna Fellows, MD;  Location: [facility];  Service: Plastics;  Laterality: Right;   ORIF ORBITAL FRACTURE Right 02/03/2019   Procedure: OPEN TREATMENT RIGHT ORBITAL FLOOR FRACTURE REVISION, PERI ORBITAL APPROACH;  Surgeon: Glenna Fellows, MD;  Location: [facility];  Service: Plastics;  Laterality: Right;   Family History  Problem Relation Age of Onset   Varicose Veins Mother    Diabetes Father    COPD Father    Heart disease Father    Pancreatic cancer Maternal Grandmother    Arthritis Maternal Grandmother    Cancer Maternal Grandmother    Arthritis Paternal Grandmother    Hyperlipidemia Paternal Aunt    Breast cancer Neg Hx    Social History   Socioeconomic History   Marital status: Divorced    Spouse name: Not on file   Number of children: 2   Years of education: Not on file   Highest education level: Bachelor's degree (e.g., BA, AB, BS)  Occupational History   Not on file  Tobacco Use   Smoking status: Former    Current packs/day: 0.00    Average packs/day: 1 pack/day for 15.0 years (15.0 ttl pk-yrs)    Types: Cigarettes    Start date: 12/19/1970    Quit date: 12/18/1985    Years since quitting: 37.4   Smokeless tobacco: Never  Vaping Use   Vaping status: Never Used  Substance and Sexual Activity   Alcohol use: Yes    Alcohol/week: 3.0 standard drinks of alcohol    Types: 2 Glasses of wine, 1 Shots of liquor per week    Comment: 2 glasses /week   Drug use: Never   Sexual activity: Not Currently    Birth control/protection: Abstinence  Other Topics Concern   Not on file  Social History Narrative   Daughter #1 - lives in [place], Daughter #2 - lives in [place], mother lives in [place]    Social Drivers of Health   Financial Resource Strain: Medium Risk (04/10/2023)   Overall Financial Resource Strain (CARDIA)    Difficulty of Paying Living Expenses: Somewhat hard  Food  Insecurity: No Food Insecurity (04/10/2023)   Hunger Vital Sign    Worried About Running Out of Food in the Last Year: Never true    Ran Out of Food in the Last Year: Never true  Transportation Needs: No Transportation Needs (04/10/2023)   PRAPARE - Administrator, Civil Service (Medical): No    Lack of Transportation (Non-Medical): No  Physical Activity: Inactive (04/10/2023)   Exercise Vital Sign    Days of Exercise per Week: 0 days    Minutes of Exercise per Session: 10 min  Stress: No Stress Concern Present (04/10/2023)   [employer] of Occupational Health - Occupational  Stress Questionnaire    Feeling of Stress : Only a little  Social Connections: Moderately Isolated (04/10/2023)   Social Connection and Isolation Panel [NHANES]    Frequency of Communication with Friends and Family: More than three times a week    Frequency of Social Gatherings with Friends and Family: Twice a week    Attends Religious Services: Never    Diplomatic Services operational officer: No    Attends Engineer, structural: More than 4 times per year    Marital Status: Divorced    Tobacco Counseling Counseling given: Not Answered   Clinical Intake:  Pre-visit preparation completed: Yes  Pain : No/denies pain     Nutritional Risks: None Diabetes: Yes CBG done?: No Did pt. bring in CBG monitor from home?: No  How often do you need to have someone help you when you read instructions, pamphlets, or other written materials from your doctor or pharmacy?: 1 - Never What is the last grade level you completed in school?: post graduate college  Interpreter Needed?: No      Activities of Daily Living    05/16/2023    2:05 PM 05/16/2023    8:33 AM  In your present state of health, do you have any difficulty performing the following activities:  Hearing? 0 0  Vision? 1 1  Difficulty concentrating or making decisions? 0 0  Walking or climbing stairs? 1 1  Dressing or bathing? 0 0   Doing errands, shopping? 0 0  Preparing Food and eating ? N N  Using the Toilet? N N  In the past six months, have you accidently leaked urine? Y Y  Do you have problems with loss of bowel control? N N  Managing your Medications? N N  Managing your Finances? N N  Housekeeping or managing your Housekeeping? N N    Patient Care Team: Blane Ohara, MD as PCP - General (Internal Medicine) Sallye Lat, MD as Consulting Physician (Ophthalmology) Toni Arthurs, MD as Consulting Physician (Orthopedic Surgery)  Indicate any recent Medical Services you may have received from other than [facility] providers in the past year (date may be approximate).     Assessment:   This is a routine wellness examination for Tyauna.  Hearing/Vision screen No results found.   Goals Addressed             This Visit's Progress    Set My Weight Loss Goal       Follow Up Date 05/15/2024    - set weight loss goal - 56 pounds Currently on Ozempic, healthy snacks, prepared meals, exercise more.   Why is this important?  Wants to walk her dog without falling, be healthy again. Losing only 5 to 15 percent of your weight makes a big difference in your health.    Notes:  - set goal weight - drink 6 to 8 glasses of water each day - fill half of plate with vegetables - keep a food diary - limit fast food meals to no more than 1 per week - manage portion size - read food labels for fat, fiber, carbohydrates and portion size - reduce red meat to 2 to 3 times a week - switch to low-fat or skim milk - switch to sugar-free drinks exercise 3 days a week Going a step further _ Find a healthy go-to snack that is low in carbs, sugar ans fat _ Increase servings of fruit _ Increase servings of vegetables _ Reduce soda _ Limit  processed foods _ consult a dietician about meal planning       Depression Screen    05/16/2023    2:10 PM 04/11/2023    9:03 AM 01/09/2023    9:54 AM 11/27/2022    9:10 AM  09/14/2022   10:01 AM 04/11/2022    9:39 AM 03/16/2022   10:43 AM  PHQ 2/9 Scores  PHQ - 2 Score 0 1 1 0 1 0 0  PHQ- 9 Score 3 5 4  0 1 1 1     Fall Risk    05/16/2023    2:10 PM 05/16/2023    8:33 AM 11/27/2022    9:10 AM 09/14/2022    9:48 AM 04/11/2022   10:10 AM  Fall Risk   Falls in the past year? 0 0 0 0 0  Number falls in past yr: 0 0 0 0 0  Injury with Fall? 0 0 0 0 0  Risk for fall due to : No Fall Risks  No Fall Risks No Fall Risks Impaired balance/gait  Follow up Falls evaluation completed  Falls evaluation completed Falls evaluation completed Falls evaluation completed;Education provided    MEDICARE RISK AT HOME: Medicare Risk at Home Any stairs in or around the home?: Yes If so, are there any without handrails?: Yes Home free of loose throw rugs in walkways, pet beds, electrical cords, etc?: No Adequate lighting in your home to reduce risk of falls?: Yes Life alert?: No Use of a cane, walker or w/c?: Yes Grab bars in the bathroom?: Yes Shower chair or bench in shower?: Yes Elevated toilet seat or a handicapped toilet?: Yes  TIMED UP AND GO:  Was the test performed?  No    Cognitive Function:        05/16/2023    2:12 PM 04/11/2022   10:11 AM 03/14/2021   11:22 AM 01/20/2020   10:14 AM  6CIT Screen  What Year? 0 points 0 points 0 points 0 points  What month? 0 points 0 points 0 points 0 points  What time? 0 points 0 points 0 points 0 points  Count back from 20 0 points 0 points 0 points 0 points  Months in reverse 0 points 0 points 0 points 0 points  Repeat phrase 0 points 0 points 0 points 0 points  Total Score 0 points 0 points 0 points 0 points    Immunizations Immunization History  Administered Date(s) Administered   Fluad Quad(high Dose 65+) 10/21/2019, 11/10/2020, 03/16/2022   Fluad Trivalent(High Dose 65+) 01/09/2023   Influenza, High Dose Seasonal PF 10/27/2018   Influenza-Unspecified 10/09/2018   Moderna Covid-19 Vaccine Bivalent Booster 33yrs & up  11/10/2020   Moderna SARS-COV2 Booster Vaccination 12/08/2019, 06/21/2020   Moderna Sars-Covid-2 Vaccination 02/25/2019, 03/23/2019   PNEUMOCOCCAL CONJUGATE-20 03/16/2022   Pfizer(Comirnaty)Fall Seasonal Vaccine 12 years and older 01/09/2022, 01/09/2023   Pneumococcal Conjugate-13 12/21/2013   Pneumococcal Polysaccharide-23 08/29/2010   Tdap 05/17/2010, 03/11/2018   Zoster, Live 06/27/2011, 08/28/2012    TDAP status: Up to date  Flu Vaccine status: Up to date  Pneumococcal vaccine status: Up to date  Covid-19 vaccine status: Completed vaccines  Qualifies for Shingles Vaccine? Yes   Zostavax completed No   Shingrix Completed?: No.    Education has been provided regarding the importance of this vaccine. Patient has been advised to call insurance company to determine out of pocket expense if they have not yet received this vaccine. Advised may also receive vaccine at local pharmacy or Health Dept.  Verbalized acceptance and understanding.  Screening Tests Health Maintenance  Topic Date Due   Zoster Vaccines- Shingrix (1 of 2) 10/04/1964   COVID-19 Vaccine (6 - Moderna risk 2024-25 season) 07/10/2023   INFLUENZA VACCINE  09/06/2023   OPHTHALMOLOGY EXAM  09/21/2023   HEMOGLOBIN A1C  10/12/2023   FOOT EXAM  04/08/2024   Diabetic kidney evaluation - eGFR measurement  04/10/2024   Diabetic kidney evaluation - Urine ACR  04/10/2024   DEXA SCAN  05/02/2024   Medicare Annual Wellness (AWV)  05/15/2024   DTaP/Tdap/Td (3 - Td or Tdap) 03/11/2028   Pneumonia Vaccine 82+ Years old  Completed   Hepatitis C Screening  Completed   HPV VACCINES  Aged Out   Meningococcal B Vaccine  Aged Out   Fecal DNA (Cologuard)  Discontinued    Health Maintenance  Health Maintenance Due  Topic Date Due   Zoster Vaccines- Shingrix (1 of 2) 10[birth year]04/1964    Colorectal cancer screening: Type of screening: Cologuard. Completed 07/04/2020. Repeat every 03 years  Mammogram status: Ordered  . Pt provided  with contact info and advised to call to schedule appt.   Bone Density status: Completed 05/03/2022. Results reflect: Bone density results: NORMAL. Repeat every 02 years.  Lung Cancer Screening: (Low Dose CT Chest recommended if Age 34-80 years, 20 pack-year currently smoking OR have quit w/in 15years.) does not qualify.   Lung Cancer Screening Referral: N/A  Additional Screening:  Hepatitis C Screening: does not qualify; Completed N/A  Vision Screening: Recommended annual ophthalmology exams for early detection of glaucoma and other disorders of the eye. Is the patient up to date with their annual eye exam?  Yes  Who is the provider or what is the name of the office in which the patient attends annual eye exams? [facility] If pt is not established with a provider, would they like to be referred to a provider to establish care? No .   Dental Screening: Recommended annual dental exams for proper oral hygiene  Diabetic Foot Exam: Diabetic Foot Exam: Completed needs to be completed  Community Resource Referral / Chronic Care Management: CRR required this visit?  No   CCM required this visit?  No     Plan:     I have personally reviewed and noted the following in the patient's chart:   Medical and social history Use of alcohol, tobacco or illicit drugs  Current medications and supplements including opioid prescriptions. Patient is not currently taking opioid prescriptions. Functional ability and status Nutritional status Physical activity Advanced directives List of other physicians Hospitalizations, surgeries, and ER visits in previous 12 months Vitals Screenings to include cognitive, depression, and falls Referrals and appointments  In addition, I have reviewed and discussed with patient certain preventive protocols, quality metrics, and best practice recommendations. A written personalized care plan for preventive services as well as general preventive health  recommendations were provided to patient.     Lajuana Matte, FNP [facility] [phone]   05/16/2023   After Visit Summary: (Declined) Due to this being a telephonic visit, with patients personalized plan was offered to patient but patient Declined AVS at this time

## 2023-05-16 NOTE — Telephone Encounter (Signed)
 Called patient back regarding her prescription for citalopram. Dr Sedalia Muta wanted her to decrease her dose from 40 mg to 20 mg due to the risk of QT elongation with patient's over the age of 65. Patient is agreeable to continue taking the lower dose as she hasn't noticed any changes on the lower dose. PHQ9 - 3, GAD7 - 0

## 2023-05-17 ENCOUNTER — Other Ambulatory Visit: Payer: Self-pay | Admitting: Family Medicine

## 2023-05-17 DIAGNOSIS — E782 Mixed hyperlipidemia: Secondary | ICD-10-CM

## 2023-05-20 ENCOUNTER — Encounter: Payer: Self-pay | Admitting: Podiatry

## 2023-05-20 ENCOUNTER — Ambulatory Visit (INDEPENDENT_AMBULATORY_CARE_PROVIDER_SITE_OTHER): Admitting: Podiatry

## 2023-05-20 DIAGNOSIS — S90424A Blister (nonthermal), right lesser toe(s), initial encounter: Secondary | ICD-10-CM

## 2023-05-20 DIAGNOSIS — E119 Type 2 diabetes mellitus without complications: Secondary | ICD-10-CM

## 2023-05-20 DIAGNOSIS — M2041 Other hammer toe(s) (acquired), right foot: Secondary | ICD-10-CM

## 2023-05-20 DIAGNOSIS — M2042 Other hammer toe(s) (acquired), left foot: Secondary | ICD-10-CM

## 2023-05-20 MED ORDER — MUPIROCIN 2 % EX OINT: 1.0000 | TOPICAL_OINTMENT | Freq: Two times a day (BID) | CUTANEOUS | 0 refills | Status: DC

## 2023-05-20 NOTE — Progress Notes (Signed)
 Chief Complaint  Patient presents with   Post-op Problem    POV for the Right tenotomy, the toes look good, there is a blister on the 5th lateral side. It is big with fluid in it and red around it. She is here also for the left tenotomy if everything is ok, she did bring her post op shoe. Last A1c was 6.4 in march. Takes ASA 81  Date of procedure: 04/29/2023 Procedure: Flexor tenotomy's of the second, third, fourth toes right foot.  HPI: 78 y.o. female presents following right foot flexor tenotomy's of the second, third, fourth toes.  She denies any pain.  She has been slow to heal the tenotomy sites plantarly.  Past Medical History:  Diagnosis Date   Allergy 07/1989   Foam tape welts   Anxiety    Arthritis 2009   Cataract 2022   Closed fracture of phalanx of foot 12/21/2019   Depression    GERD (gastroesophageal reflux disease)    Hyperlipidemia    Prediabetes    Sepsis secondary to UTI (HCC) 11/27/2022   Sleep apnea    uses CPAP every night    Past Surgical History:  Procedure Laterality Date   CESAREAN SECTION     DIAGNOSTIC LAPAROSCOPY     EYE SURGERY  2022   FLAT FOOT RECONSTRUCTION-TAL GASTROC RECESSION Right 09/25/2021   In [place], [place]   FRACTURE SURGERY  1960   HERNIA REPAIR     JOINT REPLACEMENT     knee replacements bilat   JOINT REPLACEMENT     hip replacements bilat   LIPOMA EXCISION     Dr Britta Candy   ORIF ORBITAL FRACTURE Right 12/19/2018   Procedure: OPEN TREATMENT RIGHT ORBITAL FLOOR WITH IMPLANT, PERIORBITAL APPROACH;  Surgeon: Alger Infield, MD;  Location: [facility];  Service: Plastics;  Laterality: Right;   ORIF ORBITAL FRACTURE Right 02/03/2019   Procedure: OPEN TREATMENT RIGHT ORBITAL FLOOR FRACTURE REVISION, PERI ORBITAL APPROACH;  Surgeon: Alger Infield, MD;  Location:  SURGERY CENTER;  Service: Plastics;  Laterality: Right;    Allergies  Allergen Reactions   Adhesive [Tape]     Red whelps     ROS  denies any nausea, vomiting, fever, chills, chest pain, shortness   Physical Exam: There were no vitals filed for this visit.  General: The patient is alert and oriented x3 in no acute distress.  Dermatology: Skin is warm, dry and supple bilateral lower extremities. Interspaces are clear of maceration and debris.  Fourth toe flexor tenotomy site appear well-healed.  Second toe flexor tenotomy site has some maceration about this without significant drainage, without erythema.  Similar appearance to the third toe flexor tenotomy site which is reopened.  New blister formation lateral aspect of fifth toe without signs of infection  Vascular: Palpable pedal pulses bilaterally. Capillary refill within normal limits.  No appreciable edema.  No erythema or calor.  Neurological: Light touch sensation grossly intact bilateral feet.   Musculoskeletal Exam: Right foot improved rectus position with decreased flexion contracture of third and fourth hammertoe.  There does appear to be decreased pressure on the second hammertoe distal aspect though there is still rigid digital contracture.  Left foot hammertoe contractures of lesser digits with third and fourth toe being fairly reducible, second toe more rigid.  Radiographs: Right foot 3 views 05/06/2023 Hardware appreciated from prior subtalar joint, talonavicular joint, first TMT J fusion.  Hallux malleus and fifth toe hammertoe contracture noted.  Improved alignment of third and fourth hammertoes.  Some residual contracture noted to second toe.  Assessment/Plan of Care: 1. Acquired hammertoes of both feet   2. Blister of fifth toe of right foot, initial encounter   3. Controlled type 2 diabetes mellitus without complication, unspecified whether long term insulin use (HCC)      Meds ordered this encounter  Medications   mupirocin  ointment (BACTROBAN ) 2 %    Sig: Apply 1 Application topically 2 (two) times daily. Apply to blister on right foot, and to  2nd and 3rd toes    Dispense:  22 g    Refill:  0   None  Discussed clinical findings with patient today.  Deferring the flexor tenotomy's of the left foot today.  The right foot 2nd and 3rd toes have been slow to heal.  Mupirocin  and bandage applied today.  Rx sent to patient's pharmacy.  Discussed daily dressing changes.  Monitor for signs of infection.  Accommodative padding applied around the fifth toe blister.  Continue to pad around this area to prevent rubbing on surgical shoe.  And also dressed with mupirocin  and bandage.  Follow-up in about 2 weeks for reevaluation  Connie Marks L. Lunda Salines, AACFAS [facility]     [address] [place], [place] [address]                Office [phone]  Fax [phone]

## 2023-05-20 NOTE — Patient Instructions (Signed)
 Dressed the blister and the flexor tenotomy sites of the right foot with the mupirocin ointment 1-2 times a day and keep covered with bandage.  Pad around the blister with felt padding.  If the toe sites appear to becoming too moist, paint Betadine over these areas instead.  Monitor for signs of infection including redness, swelling, drainage, pain.  More silicone and pads can be purchased from:  [email or web address]

## 2023-05-21 ENCOUNTER — Other Ambulatory Visit: Payer: Self-pay

## 2023-05-22 ENCOUNTER — Telehealth: Payer: Self-pay

## 2023-05-22 NOTE — Telephone Encounter (Signed)
 CPAP supplies order sent via synapse.

## 2023-05-29 ENCOUNTER — Ambulatory Visit
Admission: RE | Admit: 2023-05-29 | Discharge: 2023-05-29 | Disposition: A | Discharge status: Home / Self Care | Source: Ambulatory Visit | Attending: Family Medicine | Admitting: Family Medicine

## 2023-05-29 DIAGNOSIS — Z1231 Encounter for screening mammogram for malignant neoplasm of breast: Secondary | ICD-10-CM

## 2023-06-03 ENCOUNTER — Ambulatory Visit: Admitting: Podiatry

## 2023-06-03 ENCOUNTER — Encounter: Payer: Self-pay | Admitting: Family Medicine

## 2023-06-03 ENCOUNTER — Encounter: Payer: Self-pay | Admitting: Podiatry

## 2023-06-03 DIAGNOSIS — E11621 Type 2 diabetes mellitus with foot ulcer: Secondary | ICD-10-CM | POA: Diagnosis not present

## 2023-06-03 DIAGNOSIS — S90424A Blister (nonthermal), right lesser toe(s), initial encounter: Secondary | ICD-10-CM

## 2023-06-03 DIAGNOSIS — M2042 Other hammer toe(s) (acquired), left foot: Secondary | ICD-10-CM

## 2023-06-03 DIAGNOSIS — L97512 Non-pressure chronic ulcer of other part of right foot with fat layer exposed: Secondary | ICD-10-CM | POA: Diagnosis not present

## 2023-06-03 DIAGNOSIS — Z9889 Other specified postprocedural states: Secondary | ICD-10-CM

## 2023-06-03 DIAGNOSIS — M2041 Other hammer toe(s) (acquired), right foot: Secondary | ICD-10-CM

## 2023-06-03 NOTE — Progress Notes (Unsigned)
 Chief Complaint  Patient presents with   Wound Check    LEFT SUB 5TH Denies pain, drainage, N/V/F/C/SOB  Date of procedure: 04/29/2023 Procedure: Flexor tenotomy's of the second, third, fourth toes right foot.  HPI: 78 y.o. female presents following right foot flexor tenotomy's of the second, third, fourth toes.  Right fifth toe hemorrhagic blister appears to have coalesced.  She states that the tenotomy sites are doing well and she denies any pain.  Past Medical History:  Diagnosis Date   Allergy 07/1989   Foam tape welts   Anxiety    Arthritis 2009   Cataract 2022   Closed fracture of phalanx of foot 12/21/2019   Depression    GERD (gastroesophageal reflux disease)    Hyperlipidemia    Prediabetes    Sepsis secondary to UTI (HCC) 11/27/2022   Sleep apnea    uses CPAP every night    Past Surgical History:  Procedure Laterality Date   CESAREAN SECTION     DIAGNOSTIC LAPAROSCOPY     EYE SURGERY  2022   FLAT FOOT RECONSTRUCTION-TAL GASTROC RECESSION Right 09/25/2021   In [place], [place]   FRACTURE SURGERY  1960   HERNIA REPAIR     JOINT REPLACEMENT     knee replacements bilat   JOINT REPLACEMENT     hip replacements bilat   LIPOMA EXCISION     Dr Britta Candy   ORIF ORBITAL FRACTURE Right 12/19/2018   Procedure: OPEN TREATMENT RIGHT ORBITAL FLOOR WITH IMPLANT, PERIORBITAL APPROACH;  Surgeon: Alger Infield, MD;  Location: [facility];  Service: Plastics;  Laterality: Right;   ORIF ORBITAL FRACTURE Right 02/03/2019   Procedure: OPEN TREATMENT RIGHT ORBITAL FLOOR FRACTURE REVISION, PERI ORBITAL APPROACH;  Surgeon: Alger Infield, MD;  Location: [facility];  Service: Plastics;  Laterality: Right;    Allergies  Allergen Reactions   Adhesive [Tape]     Red whelps     ROS denies any nausea, vomiting, fever, chills, chest pain, shortness   Physical Exam: There were no vitals filed for this visit.  General: The patient is alert and  oriented x3 in no acute distress.  Dermatology: Skin is warm, dry and supple bilateral lower extremities. Interspaces are clear of maceration and debris.  Flexor tenotomy sites right foot second, third, fourth toes appear well-healed.  There is small pinpoint stable scab formation plantar aspect 2nd and 3rd toe sulcus level.  Dried hemorrhagic blister to fifth toe lateral aspect.  This was excisionally debrided today, underlying area with fat layer exposed 0.5 cm in diameter.  No erythema, no signs of acute infection. Surrounding area appears well healed.  Vascular: Palpable pedal pulses bilaterally. Capillary refill within normal limits.  No appreciable edema.  No erythema or calor.  Neurological: Light touch sensation grossly intact bilateral feet.   Musculoskeletal Exam: Right foot improved rectus position with decreased flexion contracture of third and fourth hammertoe.  There does appear to be decreased pressure on the second hammertoe distal aspect though there is still rigid digital contracture.  Left foot hammertoe contractures of lesser digits with third and fourth toe being fairly reducible, second toe more rigid.  Radiographs: Right foot 3 views 05/06/2023 Hardware appreciated from prior subtalar joint, talonavicular joint, first TMT J fusion.  Hallux malleus and fifth toe hammertoe contracture noted.  Improved alignment of third and fourth hammertoes.  Some residual contracture noted to second toe.  Assessment/Plan of Care: 1. Blister of fifth toe of  right foot, initial encounter   2. Acquired hammertoes of both feet   3. Post-operative state      No orders of the defined types were placed in this encounter.  None  Discussed clinical findings with patient today.  Right foot flexor tenotomy's of second, third, fourth toe appear well-healed today.  Excisional debridement of right fifth toe coalesced hemorrhagic blister as described above with central area of fat layer exposed  breakdown to area of 0.5 cm.  Mupirocin  and bandage applied.  Offloading padding applied.  Likely result of use of surgical shoe.  She can return to regular shoes as tolerated, monitor area closely and continue to apply mupirocin  daily.  Follow-up in about 2 weeks for reevaluation, possible tenotomies of left foot hammertoes.  Xzavier Swinger L. Lunda Salines, AACFAS [facility]     [address] [place], [place] [address]                Office [phone]  Fax [phone]

## 2023-06-15 ENCOUNTER — Other Ambulatory Visit: Payer: Self-pay | Admitting: Family Medicine

## 2023-06-15 DIAGNOSIS — E1159 Type 2 diabetes mellitus with other circulatory complications: Secondary | ICD-10-CM

## 2023-06-18 ENCOUNTER — Ambulatory Visit: Admitting: Podiatry

## 2023-06-18 ENCOUNTER — Encounter: Payer: Self-pay | Admitting: Podiatry

## 2023-06-18 DIAGNOSIS — M2042 Other hammer toe(s) (acquired), left foot: Secondary | ICD-10-CM

## 2023-06-18 DIAGNOSIS — M2041 Other hammer toe(s) (acquired), right foot: Secondary | ICD-10-CM | POA: Diagnosis not present

## 2023-06-18 DIAGNOSIS — E119 Type 2 diabetes mellitus without complications: Secondary | ICD-10-CM

## 2023-06-18 MED ORDER — CLINDAMYCIN HCL 150 MG PO CAPS: 150.0000 mg | ORAL_CAPSULE | Freq: Three times a day (TID) | ORAL | 0 refills | Status: AC

## 2023-06-18 NOTE — Progress Notes (Unsigned)
 No chief complaint on file. Date of procedure: 04/25/2023 Procedure: Right foot flexor tenotomy second, third, fourth toes.  HPI: 78 y.o. female presents today for painful hammertoe deformities of the left foot.  She is healed the flexor tenotomy as well on the right foot to this point and does not have any pain at this point.  Did have area of blister formation previously from using postop shoe which is resolved.  She is requesting similar flexor tenotomy procedures for the left foot for the second, third, fourth, fifth hammertoes which are painful and do develop preulcerative calluses.  Past Medical History:  Diagnosis Date   Allergy 07/1989   Foam tape welts   Anxiety    Arthritis 2009   Cataract 2022   Closed fracture of phalanx of foot 12/21/2019   Depression    GERD (gastroesophageal reflux disease)    Hyperlipidemia    Prediabetes    Sepsis secondary to UTI (HCC) 11/27/2022   Sleep apnea    uses CPAP every night    Past Surgical History:  Procedure Laterality Date   CESAREAN SECTION     DIAGNOSTIC LAPAROSCOPY     EYE SURGERY  2022   FLAT FOOT RECONSTRUCTION-TAL GASTROC RECESSION Right 09/25/2021   In [place], [place]   FRACTURE SURGERY  1960   HERNIA REPAIR     JOINT REPLACEMENT     knee replacements bilat   JOINT REPLACEMENT     hip replacements bilat   LIPOMA EXCISION     Dr Britta Candy   ORIF ORBITAL FRACTURE Right 12/19/2018   Procedure: OPEN TREATMENT RIGHT ORBITAL FLOOR WITH IMPLANT, PERIORBITAL APPROACH;  Surgeon: Alger Infield, MD;  Location: [facility];  Service: Plastics;  Laterality: Right;   ORIF ORBITAL FRACTURE Right 02/03/2019   Procedure: OPEN TREATMENT RIGHT ORBITAL FLOOR FRACTURE REVISION, PERI ORBITAL APPROACH;  Surgeon: Alger Infield, MD;  Location: [facility];  Service: Plastics;  Laterality: Right;    Allergies  Allergen Reactions   Adhesive [Tape]     Red whelps     ROS denies any nausea, vomiting,  fever, chills, chest pain, shortness   Physical Exam: There were no vitals filed for this visit.  General: The patient is alert and oriented x3 in no acute distress.  Dermatology: Skin is warm, dry and supple bilateral lower extremities. Interspaces are clear of maceration and debris.  Preulcerative corn present to left second and third toe.  Right foot flexor tenotomy sites healed well.  Site of blister formation right fifth toe well-healed.  Vascular: Palpable pedal pulses bilaterally. Capillary refill within normal limits.  No appreciable edema.  No erythema or calor.  Neurological: Light touch sensation grossly intact bilateral feet.   Musculoskeletal Exam: Right foot digits 2 through 4 sitting in improved rectus position status post recent flexor tenotomy.  There are hammertoe contractures of the second, third, fourth, fifth toes left foot.  The left second toe is semireducible.  The third, fourth, fifth toes are flexible and reducible.  Assessment/Plan of Care: 1. Acquired hammertoes of both feet   2. Controlled type 2 diabetes mellitus without complication, unspecified whether long term insulin use (HCC)      Meds ordered this encounter  Medications   clindamycin  (CLEOCIN ) 150 MG capsule    Sig: Take 1 capsule (150 mg total) by mouth 3 (three) times daily for 5 days.    Dispense:  15 capsule    Refill:  0  None  Discussed clinical findings with patient today.  Patient has healed the right foot flexor tenotomy as well.  She is requesting similar procedure to the left foot of the second, third, fourth, fifth toes.  We discussed the risk, benefits and alternative therapies.  Potential risks include pain, bleeding, infection, deformity, recurrence of the hammertoe, limp, need for further procedures.  Discussed that the goal of the procedure is to get the toes into a more straightened position to alleviate pain and offload calluses.  In particular did point out that there may likely  be some second toe deformity as this toe is more rigid than the others and that will likely have some residual contracture.  She expressed good understanding and wished to proceed.  The second, third, fourth and fifth toes of the left foot were anesthetized injecting a total of 6 cc of a one-to-one ratio of 1% lidocaine  plain 0.5% Marcaine plain.  Betadine skin prep.  The second, third, fourth and fifth toes toes of the left foot were tenotomized percutaneously using an 18-gauge needle releasing the underlying flexor tendon at the level of the sulcus of the toes.  Good improved position noted to the left second, third, fourth, fifth toes. 2nd toe does have some residual contracture however improved position and reduced contracture was appreciated.  Tenotomy sites closed with Dermabond.  Toes splinted in rectus position using Steri-Strips, 4 x 4 gauze, Kling, Coban.  Instructed patient to keep dressings clean dry and intact.  Sending patient 3 days of clindamycin  as prophylaxis.  She does have pain medication from prior procedure available as needed for severe pain.  May take Tylenol  and ibuprofen  as needed for mild to moderate pain.  May weight-bear as tolerated in surgical shoe  Follow-up in 1 week with Dr. McCaughan for postop check.  She may progress to regular shoe gear if the tenotomy sites appear well-healed. Did have some delayed healing of the some of the sites on the right foot previously, discussed with the patient that this may occur here as well.    Connie Marks L. Connie Marks, AACFAS [facility]     [address] [place], [place] [address]                Office [phone]  Fax [phone]

## 2023-06-18 NOTE — Patient Instructions (Addendum)
 After Surgery Instructions   1) If you are recuperating from surgery anywhere other than home, please be sure to leave us  the number where you can be reached.  2) Go directly home and rest.  3) Keep the operated foot(feet) elevated six inches above the hip when sitting or lying down. This will help control swelling and pain.  4) Support the elevated foot and leg with pillows. DO NOT PLACE PILLOWS UNDER THE KNEE.  5) DO NOT REMOVE or get your bandages WET, unless you were given different instructions by your doctor to do so. This increases the risk of infection.  6) Wear your surgical shoe or surgical boot at all times when you are up on your feet.  7) A limited amount of pain and swelling may occur. The skin may take on a bruised appearance. DO NOT BE ALARMED, THIS IS NORMAL.  8) For slight pain and swelling, apply an ice pack directly over the bandages for 15 minutes only out of each hour of the day. Continue until seen in the office for your first post op visit. DON NOT     APPLY ANY FORM OF HEAT TO THE AREA.  9) Have prescriptions filled immediately and take as directed.  10) Drink lots of liquids, water and juice to stay hydrated.  11) CALL IMMEDIATELY IF:  *Bleeding continues until the following day of surgery  *Pain increases and/or does not respond to medication  *Bandages or cast appears to tight  *If your bandage gets wet  *Trip, fall or stump your surgical foot  *If your temperature goes above 101  *If you have ANY questions at all  YOU NOW CONTROL THE EFFORT OF YOUR RECOVERY. ADHERING TO THESE INSTRUCTIONS WILL OFFER YOU THE MOST COMPLETE RESULTS    -Take antibiotics as directed - You may take Tylenol  ibuprofen  as needed for mild pain, oxycodone  for more severe pain. -Monitor for signs and symptoms of infection ie worsening redness, swelling, drainage or feeling systemic signs such as nausea, vomiting, fever, chills -You may weight-bear as tolerated in your surgical  shoe.

## 2023-06-28 ENCOUNTER — Ambulatory Visit: Admitting: Podiatry

## 2023-06-28 DIAGNOSIS — Z9889 Other specified postprocedural states: Secondary | ICD-10-CM

## 2023-06-28 NOTE — Progress Notes (Unsigned)
 [facility]  Postoperative visit  Patient recently underwent percutaneous flexor tenotomy of left 2nd, 3rd, 4th, and 5th toes by Dr. Marvis Sluder.  These were repaired via Dermabond closure.  Patient is having no pain.  She has been wearing her surgical shoe.  She states that she has had a better postoperative course this time than when she had the right toe procedures.  On exam there are palpable pedal pulses.  Some dried blood is noted on the surgical toes but no active drainage is noted.  The skin is well adhered.  No gaps are noted where the incisions were made.  No erythema is seen.  There is slight medial angulation of the second toe at the MPJ level but according to the patient these are straighter appearing than they looked preoperatively.  Informed the patient that she is healing well.  She would like to discuss having Dr. Marvis Sluder perform a flexor tenotomy of the right fifth toe.  She states that the contracture of this toe is now much more evident since she had the other lesser toes corrected in the past.  She will schedule with him in the next 3 weeks  Haze List, DPM

## 2023-07-15 ENCOUNTER — Other Ambulatory Visit: Payer: Self-pay | Admitting: Family Medicine

## 2023-07-15 DIAGNOSIS — G2581 Restless legs syndrome: Secondary | ICD-10-CM

## 2023-07-15 NOTE — Progress Notes (Signed)
 Subjective:  Patient ID: Connie Marks, female    DOB: [birth year]  Age: 78 y.o. MRN: [id number]  Chief Complaint  Patient presents with   Medical Management of Chronic Issues    HPI: Diabetes: Patient normally eats healthy. Using ozempic  0.5 mg weekly. Diet controlled.  Last A1C 6.4.  Checks feet daily. Eye exam: will be in August.  Hyperlipidemia: She is currently taking simvastatin  40 mg daily.  GERD: Takes Lansoprazole  30 mg daily, Famotidine  40 mg at bedtime. Takes otc  Depression: Patient is taking Wellbutrin  SR 150 mg twice daily, Celexa  20 mg daily. Dose of citalopram  is higher than recommended for her age.   OSA: on CPAP.      07/16/2023    9:28 AM 05/16/2023    2:10 PM 04/11/2023    9:03 AM 01/09/2023    9:54 AM 11/27/2022    9:10 AM  Depression screen PHQ 2/9  Decreased Interest 1 0 1 1 0  Down, Depressed, Hopeless 0 0 0 0 0  PHQ - 2 Score 1 0 1 1 0  Altered sleeping 1 2 2 1  0  Tired, decreased energy 1 1 1 2  0  Change in appetite 0 0 0 0 0  Feeling bad or failure about yourself  0 0 1 0 0  Trouble concentrating 0 0 0 0 0  Moving slowly or fidgety/restless 0 0 0 0 0  Suicidal thoughts 0 0 0 0 0  PHQ-9 Score 3 3 5 4  0  Difficult doing work/chores  Somewhat difficult Somewhat difficult Somewhat difficult Not difficult at all        07/16/2023    9:27 AM  Fall Risk   Falls in the past year? 0  Number falls in past yr: 0  Injury with Fall? 0  Risk for fall due to : No Fall Risks  Follow up Education provided    Patient Care Team: Mercy Stall, MD as PCP - General (Internal Medicine) Devin Foerster, MD as Consulting Physician (Ophthalmology) Amada Backer, MD as Consulting Physician (Orthopedic Surgery)   Review of Systems  Constitutional:  Negative for chills, fatigue and fever.  HENT:  Negative for congestion, ear pain and sore throat.   Respiratory:  Negative for cough and shortness of breath.   Cardiovascular:  Negative for chest pain and  palpitations.  Gastrointestinal:  Negative for abdominal pain, constipation, diarrhea, nausea and vomiting.  Endocrine: Negative for polydipsia, polyphagia and polyuria.  Genitourinary:  Negative for difficulty urinating and dysuria.  Musculoskeletal:  Positive for arthralgias. Negative for back pain and myalgias.  Skin:  Negative for rash.  Neurological:  Negative for headaches.  Psychiatric/Behavioral:  Negative for dysphoric mood. The patient is not nervous/anxious.     Current Outpatient Medications on File Prior to Visit  Medication Sig Dispense Refill   aspirin EC 81 MG tablet Take 81 mg by mouth daily. Swallow whole.     buPROPion  (WELLBUTRIN  SR) 150 MG 12 hr tablet Take 1 tablet (150 mg total) by mouth 2 (two) times daily. 180 tablet 1   Calcium Carbonate+Vitamin D 600-200 MG-UNIT TABS Take 1 tablet by mouth daily.     Carboxymethylcellulose Sod PF (REFRESH PLUS) 0.5 % SOLN      citalopram  (CELEXA ) 20 MG tablet Take 1 tablet (20 mg total) by mouth daily. 90 tablet 0   famotidine  (PEPCID ) 40 MG tablet Take 40 mg by mouth at bedtime.     Fe Bisgly-Vit C-Vit B12-FA (GENTLE IRON) 28-60-0.008-0.4 MG CAPS  Take 1 capsule by mouth daily.     lansoprazole  (PREVACID ) 30 MG capsule Take 1 capsule (30 mg total) by mouth daily at 12 noon. 90 capsule 3   Multiple Vitamins-Minerals (MULTIVITAL PO) Take by mouth.     pramipexole  (MIRAPEX ) 0.5 MG tablet TAKE ONE TABLET BY MOUTH THREE TIMES DAILY 90 tablet 1   simvastatin  (ZOCOR ) 40 MG tablet TAKE ONE TABLET BY MOUTH ONCE DAILY 90 tablet 0   traZODone  (DESYREL ) 50 MG tablet TAKE 1/2 TO 1 TABLET BY MOUTH AT BEDTIME AS NEEDED FOR SLEEP 90 tablet 1   valACYclovir  (VALTREX ) 1000 MG tablet Take 2 tablets (2,000 mg total) by mouth 2 (two) times daily at onset 24 tablet 0   No current facility-administered medications on file prior to visit.   Past Medical History:  Diagnosis Date   Allergy 07/1989   Foam tape welts   Anxiety    Arthritis 2009    Cataract 2022   Closed fracture of phalanx of foot 12/21/2019   Depression    GERD (gastroesophageal reflux disease)    Hyperlipidemia    Prediabetes    Sepsis secondary to UTI (HCC) 11/27/2022   Sleep apnea    uses CPAP every night   Past Surgical History:  Procedure Laterality Date   CESAREAN SECTION     DIAGNOSTIC LAPAROSCOPY     EYE SURGERY  2022   FLAT FOOT RECONSTRUCTION-TAL GASTROC RECESSION Right 09/25/2021   In [place], [place]   FRACTURE SURGERY  1960   HERNIA REPAIR     JOINT REPLACEMENT     knee replacements bilat   JOINT REPLACEMENT     hip replacements bilat   LIPOMA EXCISION     Dr Britta Candy   ORIF ORBITAL FRACTURE Right 12/19/2018   Procedure: OPEN TREATMENT RIGHT ORBITAL FLOOR WITH IMPLANT, PERIORBITAL APPROACH;  Surgeon: Alger Infield, MD;  Location: [facility];  Service: Plastics;  Laterality: Right;   ORIF ORBITAL FRACTURE Right 02/03/2019   Procedure: OPEN TREATMENT RIGHT ORBITAL FLOOR FRACTURE REVISION, PERI ORBITAL APPROACH;  Surgeon: Alger Infield, MD;  Location: [facility];  Service: Plastics;  Laterality: Right;    Family History  Problem Relation Age of Onset   Varicose Veins Mother    Diabetes Father    COPD Father    Heart disease Father    Pancreatic cancer Maternal Grandmother    Arthritis Maternal Grandmother    Cancer Maternal Grandmother    Arthritis Paternal Grandmother    Hyperlipidemia Paternal Aunt    Breast cancer Neg Hx    Social History   Socioeconomic History   Marital status: Divorced    Spouse name: Not on file   Number of children: 2   Years of education: Not on file   Highest education level: Bachelor's degree (e.g., BA, AB, BS)  Occupational History   Not on file  Tobacco Use   Smoking status: Former    Current packs/day: 0.00    Average packs/day: 1 pack/day for 15.0 years (15.0 ttl pk-yrs)    Types: Cigarettes    Start date: 12/19/1970    Quit date: 12/18/1985    Years since  quitting: 37.6   Smokeless tobacco: Never  Vaping Use   Vaping status: Never Used  Substance and Sexual Activity   Alcohol use: Yes    Alcohol/week: 3.0 standard drinks of alcohol    Types: 2 Glasses of wine, 1 Shots of liquor per week    Comment: 2 glasses /week  Drug use: Never   Sexual activity: Not Currently    Birth control/protection: Abstinence  Other Topics Concern   Not on file  Social History Narrative   Daughter #1 - lives in [place], Daughter #2 - lives in [place], mother lives in [place]    Social Drivers of Health   Financial Resource Strain: Medium Risk (04/10/2023)   Overall Financial Resource Strain (CARDIA)    Difficulty of Paying Living Expenses: Somewhat hard  Food Insecurity: No Food Insecurity (04/10/2023)   Hunger Vital Sign    Worried About Running Out of Food in the Last Year: Never true    Ran Out of Food in the Last Year: Never true  Transportation Needs: No Transportation Needs (04/10/2023)   PRAPARE - Administrator, Civil Service (Medical): No    Lack of Transportation (Non-Medical): No  Physical Activity: Inactive (04/10/2023)   Exercise Vital Sign    Days of Exercise per Week: 0 days    Minutes of Exercise per Session: 10 min  Stress: No Stress Concern Present (04/10/2023)   [employer] of Occupational Health - Occupational Stress Questionnaire    Feeling of Stress : Only a little  Social Connections: Moderately Isolated (04/10/2023)   Social Connection and Isolation Panel    Frequency of Communication with Friends and Family: More than three times a week    Frequency of Social Gatherings with Friends and Family: Twice a week    Attends Religious Services: Never    Diplomatic Services operational officer: No    Attends Engineer, structural: More than 4 times per year    Marital Status: Divorced    Objective:  BP 100/60   Pulse 77   Temp (!) 97.2 F (36.2 C)   Resp 16   Ht 5' 4 (1.626 m)   Wt 195 lb (88.5 kg)   SpO2 97%    BMI 33.47 kg/m      07/16/2023    9:17 AM 04/11/2023    9:02 AM 01/09/2023    9:52 AM  BP/Weight  Systolic BP 100 122 120  Diastolic BP 60 82 68  Wt. (Lbs) 195 214 209  BMI 33.47 kg/m2 36.73 kg/m2 35.87 kg/m2    Physical Exam Vitals reviewed.  Constitutional:      Appearance: Normal appearance. She is obese.  Neck:     Vascular: No carotid bruit.   Cardiovascular:     Rate and Rhythm: Normal rate and regular rhythm.     Heart sounds: Normal heart sounds.  Pulmonary:     Effort: Pulmonary effort is normal. No respiratory distress.     Breath sounds: Normal breath sounds.  Abdominal:     General: Abdomen is flat. Bowel sounds are normal.     Palpations: Abdomen is soft.     Tenderness: There is no abdominal tenderness.   Musculoskeletal:        General: Tenderness (fingers and feet.) present.   Neurological:     Mental Status: She is alert and oriented to person, place, and time.   Psychiatric:        Mood and Affect: Mood normal.        Behavior: Behavior normal.     Diabetic Foot Exam - Simple   Simple Foot Form  07/16/2023  9:20 PM  Visual Inspection See comments: Yes Sensation Testing Intact to touch and monofilament testing bilaterally: Yes Pulse Check Posterior Tibialis and Dorsalis pulse intact bilaterally: Yes Comments Significant abnormalities. History  of surgery. Calluses.       Lab Results  Component Value Date   WBC 7.8 07/16/2023   HGB 11.7 07/16/2023   HCT 36.3 07/16/2023   PLT 274 07/16/2023   GLUCOSE 99 07/16/2023   CHOL 151 07/16/2023   TRIG 116 07/16/2023   HDL 58 07/16/2023   LDLCALC 72 07/16/2023   ALT 16 07/16/2023   AST 19 07/16/2023   NA 141 07/16/2023   K 5.2 07/16/2023   CL 104 07/16/2023   CREATININE 1.24 (H) 07/16/2023   BUN 15 07/16/2023   CO2 19 (L) 07/16/2023   TSH 1.140 07/16/2023   HGBA1C 5.8 (H) 07/16/2023      Assessment & Plan:  Combined hyperlipidemia associated with type 2 diabetes mellitus  (HCC) Assessment & Plan: Diabetes Control: good Cholesterol at goal.  Recommend check feet daily. Recommend annual eye exams. Medicines: start on ozempic  0.25 mg once weekly for 4 weeks, then increase to 0.5 mg weekly. Continue simvastatin  40 mg daily. Continue to work on eating a healthy diet and exercise.  Labs drawn today.      Orders: -     CBC with Differential/Platelet -     Comprehensive metabolic panel with GFR -     Hemoglobin A1c -     Lipid panel -     TSH  Gastroesophageal reflux disease without esophagitis Assessment & Plan: The current medical regimen is effective;  continue present plan and medications. Continue Lansoprazole  30 mg  daily, Famotidine  40 mg at bedtime    OSA on CPAP Assessment & Plan: Continue using CPAP as directed.     Microcytic anemia Assessment & Plan: Check cbc.   Orders: -     Iron, TIBC and Ferritin Panel  RLS (restless legs syndrome) Assessment & Plan: Start mirapex .   Mild episode of recurrent major depressive disorder (HCC) Assessment & Plan: The current medical regimen is effective;  continue present plan and medications. Continue Citalopram  dose to 20mg  daily and wellbutrin  sr 150 mg twice daily.    BMI 33.0-33.9,adult Assessment & Plan: Recommend continue to work on eating healthy diet and exercise. Hope ozempic  will help with weight loss.    Screen for colon cancer -     Cologuard  Mixed hyperlipidemia Assessment & Plan: .     No orders of the defined types were placed in this encounter.   Orders Placed This Encounter  Procedures   CBC with Differential/Platelet   Comprehensive metabolic panel with GFR   Hemoglobin A1c   Lipid panel   TSH   Iron, TIBC and Ferritin Panel   Cologuard     Follow-up: Return in about 4 months (around 11/15/2023) for chronic follow up.   I,Marla I Leal-Borjas,acting as a scribe for Mercy Stall, MD.,have documented all relevant documentation on the behalf of  Mercy Stall, MD,as directed by  Mercy Stall, MD while in the presence of Mercy Stall, MD.   An After Visit Summary was printed and given to the patient.  I attest that I have reviewed this visit and agree with the plan scribed by my staff.  Mercy Stall, MD [facility] [phone]

## 2023-07-16 ENCOUNTER — Encounter: Payer: Self-pay | Admitting: Family Medicine

## 2023-07-16 ENCOUNTER — Ambulatory Visit (INDEPENDENT_AMBULATORY_CARE_PROVIDER_SITE_OTHER): Admitting: Family Medicine

## 2023-07-16 VITALS — BP 100/60 | HR 77 | Temp 97.2°F | Resp 16 | Ht 64.0 in | Wt 195.0 lb

## 2023-07-16 DIAGNOSIS — D509 Iron deficiency anemia, unspecified: Secondary | ICD-10-CM | POA: Diagnosis not present

## 2023-07-16 DIAGNOSIS — G4733 Obstructive sleep apnea (adult) (pediatric): Secondary | ICD-10-CM

## 2023-07-16 DIAGNOSIS — Z1211 Encounter for screening for malignant neoplasm of colon: Secondary | ICD-10-CM

## 2023-07-16 DIAGNOSIS — K219 Gastro-esophageal reflux disease without esophagitis: Secondary | ICD-10-CM | POA: Diagnosis not present

## 2023-07-16 DIAGNOSIS — E1169 Type 2 diabetes mellitus with other specified complication: Secondary | ICD-10-CM

## 2023-07-16 DIAGNOSIS — G2581 Restless legs syndrome: Secondary | ICD-10-CM | POA: Diagnosis not present

## 2023-07-16 DIAGNOSIS — Z6833 Body mass index (BMI) 33.0-33.9, adult: Secondary | ICD-10-CM

## 2023-07-16 DIAGNOSIS — E782 Mixed hyperlipidemia: Secondary | ICD-10-CM | POA: Diagnosis not present

## 2023-07-16 DIAGNOSIS — F330 Major depressive disorder, recurrent, mild: Secondary | ICD-10-CM

## 2023-07-17 ENCOUNTER — Ambulatory Visit: Payer: Self-pay | Admitting: Family Medicine

## 2023-07-17 LAB — CBC WITH DIFFERENTIAL/PLATELET
Basophils Absolute: 0.1 10*3/uL (ref 0.0–0.2)
Basos: 1 %
EOS (ABSOLUTE): 0.2 10*3/uL (ref 0.0–0.4)
Eos: 2 %
Hematocrit: 36.3 % (ref 34.0–46.6)
Hemoglobin: 11.7 g/dL (ref 11.1–15.9)
Immature Grans (Abs): 0 10*3/uL (ref 0.0–0.1)
Immature Granulocytes: 0 %
Lymphocytes Absolute: 1.8 10*3/uL (ref 0.7–3.1)
Lymphs: 23 %
MCH: 27.6 pg (ref 26.6–33.0)
MCHC: 32.2 g/dL (ref 31.5–35.7)
MCV: 86 fL (ref 79–97)
Monocytes Absolute: 0.8 10*3/uL (ref 0.1–0.9)
Monocytes: 11 %
Neutrophils Absolute: 4.9 10*3/uL (ref 1.4–7.0)
Neutrophils: 63 %
Platelets: 274 10*3/uL (ref 150–450)
RBC: 4.24 x10E6/uL (ref 3.77–5.28)
RDW: 16.4 % — ABNORMAL HIGH (ref 11.7–15.4)
WBC: 7.8 10*3/uL (ref 3.4–10.8)

## 2023-07-17 LAB — COMPREHENSIVE METABOLIC PANEL WITH GFR
ALT: 16 IU/L (ref 0–32)
AST: 19 IU/L (ref 0–40)
Albumin: 4.5 g/dL (ref 3.8–4.8)
Alkaline Phosphatase: 66 IU/L (ref 44–121)
BUN/Creatinine Ratio: 12 (ref 12–28)
BUN: 15 mg/dL (ref 8–27)
Bilirubin Total: 0.3 mg/dL (ref 0.0–1.2)
CO2: 19 mmol/L — ABNORMAL LOW (ref 20–29)
Calcium: 10.1 mg/dL (ref 8.7–10.3)
Chloride: 104 mmol/L (ref 96–106)
Creatinine, Ser: 1.24 mg/dL — ABNORMAL HIGH (ref 0.57–1.00)
Globulin, Total: 2.4 g/dL (ref 1.5–4.5)
Glucose: 99 mg/dL (ref 70–99)
Potassium: 5.2 mmol/L (ref 3.5–5.2)
Sodium: 141 mmol/L (ref 134–144)
Total Protein: 6.9 g/dL (ref 6.0–8.5)
eGFR: 45 mL/min/{1.73_m2} — ABNORMAL LOW (ref >59)

## 2023-07-17 LAB — LIPID PANEL
Chol/HDL Ratio: 2.6 ratio (ref 0.0–4.4)
Cholesterol, Total: 151 mg/dL (ref 100–199)
HDL: 58 mg/dL (ref >39)
LDL Chol Calc (NIH): 72 mg/dL (ref 0–99)
Triglycerides: 116 mg/dL (ref 0–149)
VLDL Cholesterol Cal: 21 mg/dL (ref 5–40)

## 2023-07-17 LAB — TSH: TSH: 1.14 u[IU]/mL (ref 0.450–4.500)

## 2023-07-17 LAB — IRON,TIBC AND FERRITIN PANEL
Ferritin: 18 ng/mL (ref 15–150)
Iron Saturation: 10 % — ABNORMAL LOW (ref 15–55)
Iron: 40 ug/dL (ref 27–139)
Total Iron Binding Capacity: 392 ug/dL (ref 250–450)
UIBC: 352 ug/dL (ref 118–369)

## 2023-07-17 LAB — HEMOGLOBIN A1C
Est. average glucose Bld gHb Est-mCnc: 120 mg/dL
Hgb A1c MFr Bld: 5.8 % — ABNORMAL HIGH (ref 4.8–5.6)

## 2023-07-19 ENCOUNTER — Other Ambulatory Visit: Payer: Self-pay | Admitting: Family Medicine

## 2023-07-19 DIAGNOSIS — I152 Hypertension secondary to endocrine disorders: Secondary | ICD-10-CM

## 2023-07-20 DIAGNOSIS — Z1211 Encounter for screening for malignant neoplasm of colon: Secondary | ICD-10-CM | POA: Insufficient documentation

## 2023-07-20 DIAGNOSIS — D509 Iron deficiency anemia, unspecified: Secondary | ICD-10-CM | POA: Insufficient documentation

## 2023-07-20 DIAGNOSIS — Z6833 Body mass index (BMI) 33.0-33.9, adult: Secondary | ICD-10-CM | POA: Insufficient documentation

## 2023-07-20 NOTE — Assessment & Plan Note (Signed)
 The current medical regimen is effective;  continue present plan and medications. Continue Citalopram  dose to 20mg  daily and wellbutrin  sr 150 mg twice daily.

## 2023-07-20 NOTE — Assessment & Plan Note (Signed)
Continue using CPAP as directed

## 2023-07-20 NOTE — Assessment & Plan Note (Addendum)
 Diabetes Control: good Cholesterol at goal.  Recommend check feet daily. Recommend annual eye exams. Medicines: start on ozempic  0.25 mg once weekly for 4 weeks, then increase to 0.5 mg weekly. Continue simvastatin  40 mg daily. Continue to work on eating a healthy diet and exercise.  Labs drawn today.

## 2023-07-20 NOTE — Assessment & Plan Note (Signed)
The current medical regimen is effective;  continue present plan and medications. Continue Lansoprazole 30 mg  daily, Famotidine 40 mg at bedtime

## 2023-07-20 NOTE — Assessment & Plan Note (Signed)
 Check cbc

## 2023-07-20 NOTE — Assessment & Plan Note (Signed)
 Recommend continue to work on eating healthy diet and exercise. Hope ozempic  will help with weight loss.

## 2023-07-20 NOTE — Assessment & Plan Note (Addendum)
 Connie Marks

## 2023-07-20 NOTE — Assessment & Plan Note (Signed)
 Start mirapex .

## 2023-07-22 ENCOUNTER — Ambulatory Visit: Admitting: Podiatry

## 2023-07-22 DIAGNOSIS — M2042 Other hammer toe(s) (acquired), left foot: Secondary | ICD-10-CM

## 2023-07-22 DIAGNOSIS — Z9889 Other specified postprocedural states: Secondary | ICD-10-CM

## 2023-07-22 DIAGNOSIS — E119 Type 2 diabetes mellitus without complications: Secondary | ICD-10-CM

## 2023-07-22 DIAGNOSIS — M2041 Other hammer toe(s) (acquired), right foot: Secondary | ICD-10-CM

## 2023-07-22 NOTE — Progress Notes (Signed)
 Chief Complaint  Patient presents with   Routine Post Op    Routine follow up for left foot flexor tenotomies. Looks great, feels good. She would like to schedule apt for the right 5th, she did not bring a driver or the post op shoe. A1c was 5.8 on 6/10. Takes ASA   Date of procedure: 06/18/2023 Procedure: Flexor tenotomy's of the second, third, fourth, fifth toes toes left foot.  HPI: 78 y.o. female presents following left foot flexor tenotomy's of the second, third, fourth toes and fifth toes.  Previously has had flexor tenotomy of the right foot second, third, fourth toes on 3/24.  She is doing well with these however she complains of pain to the right fifth toe which was not treated at that time.  The toe cocked up and is painful in shoe gear.  She would like to discuss treatment for this.  Past Medical History:  Diagnosis Date   Allergy 07/1989   Foam tape welts   Anxiety    Arthritis 2009   Cataract 2022   Closed fracture of phalanx of foot 12/21/2019   Depression    GERD (gastroesophageal reflux disease)    Hyperlipidemia    Prediabetes    Sepsis secondary to UTI (HCC) 11/27/2022   Sleep apnea    uses CPAP every night    Past Surgical History:  Procedure Laterality Date   CESAREAN SECTION     DIAGNOSTIC LAPAROSCOPY     EYE SURGERY  2022   FLAT FOOT RECONSTRUCTION-TAL GASTROC RECESSION Right 09/25/2021   In [place], [place]   FRACTURE SURGERY  1960   HERNIA REPAIR     JOINT REPLACEMENT     knee replacements bilat   JOINT REPLACEMENT     hip replacements bilat   LIPOMA EXCISION     Dr Joesph   ORIF ORBITAL FRACTURE Right 12/19/2018   Procedure: OPEN TREATMENT RIGHT ORBITAL FLOOR WITH IMPLANT, PERIORBITAL APPROACH;  Surgeon: Arelia Filippo, MD;  Location: [facility];  Service: Plastics;  Laterality: Right;   ORIF ORBITAL FRACTURE Right 02/03/2019   Procedure: OPEN TREATMENT RIGHT ORBITAL FLOOR FRACTURE REVISION, PERI ORBITAL APPROACH;  Surgeon:  Arelia Filippo, MD;  Location: [facility];  Service: Plastics;  Laterality: Right;    Allergies  Allergen Reactions   Adhesive [Tape]     Red whelt    ROS denies any nausea, vomiting, fever, chills, chest pain, shortness   Physical Exam: There were no vitals filed for this visit.  General: The patient is alert and oriented x3 in no acute distress.  Dermatology: Skin is warm, dry and supple bilateral lower extremities. Interspaces are clear of maceration and debris.  No open wounds or areas of skin breakdown.  Flexor tenotomy sites well-healed.  Vascular: Palpable pedal pulses bilaterally. Capillary refill within normal limits.  No appreciable edema.  No erythema or calor.  Neurological: Light touch sensation grossly intact bilateral feet.   Musculoskeletal Exam: Improved alignment of left foot digits 2 through 5, some residual contracture second digit.  Improved alignment of right foot digits 2 through 4 with some residual contracture of second digit.  Does have semireducible flexor contracture of the right fifth toe that does cause pain in shoe gear.  Assessment/Plan of Care: 1. Acquired hammertoes of both feet   2. Controlled type 2 diabetes mellitus without complication, unspecified whether long term insulin use (HCC)   3. Post-operative state      No  orders of the defined types were placed in this encounter.  None  Discussed clinical findings with patient today.  She is well-healed from her flexor tenotomy procedures to this point.  She expresses that she does get pain to the fifth toe of the right foot which we did not originally perform procedure on.  The toe is semireducible.  She is requesting flexor tenotomy in the future.  Discussed that we can likely get some improvement to her alignment of the toe, the goal of which will be to help her tolerate shoe gear with decreased pain.  Did discuss risks, benefits, alternative therapies.  Did discuss it is  likely that we will not get the toe fully straight radicle the procedure would be to reduce pain and to offload any potential areas of pressure.  She expressed good understanding.  She is unsure exactly when she can get this done, she can follow-up as needed for the right fifth toe flexor tenotomy which we can do in office.  Marlean Mortell L. Lamount MAUL, AACFAS [facility]     [address] [place], [place] [address]                Office [phone]  Fax [phone]

## 2023-08-01 DIAGNOSIS — Z1211 Encounter for screening for malignant neoplasm of colon: Secondary | ICD-10-CM | POA: Diagnosis not present

## 2023-08-09 LAB — COLOGUARD: COLOGUARD: POSITIVE — AB

## 2023-08-12 ENCOUNTER — Encounter: Payer: Self-pay | Admitting: Podiatry

## 2023-08-12 ENCOUNTER — Ambulatory Visit: Admitting: Podiatry

## 2023-08-12 DIAGNOSIS — E119 Type 2 diabetes mellitus without complications: Secondary | ICD-10-CM

## 2023-08-12 DIAGNOSIS — M2041 Other hammer toe(s) (acquired), right foot: Secondary | ICD-10-CM

## 2023-08-12 MED ORDER — CLINDAMYCIN HCL 300 MG PO CAPS: 300.0000 mg | ORAL_CAPSULE | Freq: Three times a day (TID) | ORAL | 0 refills | Status: AC

## 2023-08-12 NOTE — Progress Notes (Unsigned)
 Chief Complaint  Patient presents with   Tenotomy    Right 5th toe, tenotomy. Last A1c 5.8 in May, ASA  Date of procedure: 06/18/2023 Procedure: Flexor tenotomy's of the second, third, fourth, fifth toes toes left foot.  HPI: 78 y.o. female presents today for right fifth toe flexor tenotomy.  She is dealing with painful preulcerative callus to the tip of the toe.  She states that the other sites of all done well and she denies any problems with the toes left foot right foot where she has had prior flexor tenotomies.  Her diabetes is well-controlled.  Past Medical History:  Diagnosis Date   Allergy 07/1989   Foam tape welts   Anxiety    Arthritis 2009   Cataract 2022   Closed fracture of phalanx of foot 12/21/2019   Depression    GERD (gastroesophageal reflux disease)    Hyperlipidemia    Prediabetes    Sepsis secondary to UTI (HCC) 11/27/2022   Sleep apnea    uses CPAP every night    Past Surgical History:  Procedure Laterality Date   CESAREAN SECTION     DIAGNOSTIC LAPAROSCOPY     EYE SURGERY  2022   FLAT FOOT RECONSTRUCTION-TAL GASTROC RECESSION Right 09/25/2021   In [place], [place]   FRACTURE SURGERY  1960   HERNIA REPAIR     JOINT REPLACEMENT     knee replacements bilat   JOINT REPLACEMENT     hip replacements bilat   LIPOMA EXCISION     Dr Joesph   ORIF ORBITAL FRACTURE Right 12/19/2018   Procedure: OPEN TREATMENT RIGHT ORBITAL FLOOR WITH IMPLANT, PERIORBITAL APPROACH;  Surgeon: Arelia Filippo, MD;  Location: [facility];  Service: Plastics;  Laterality: Right;   ORIF ORBITAL FRACTURE Right 02/03/2019   Procedure: OPEN TREATMENT RIGHT ORBITAL FLOOR FRACTURE REVISION, PERI ORBITAL APPROACH;  Surgeon: Arelia Filippo, MD;  Location: [facility];  Service: Plastics;  Laterality: Right;    Allergies  Allergen Reactions   Adhesive [Tape]     Red whelt    ROS denies any nausea, vomiting, fever, chills, chest pain, shortness    Physical Exam: There were no vitals filed for this visit.  General: The patient is alert and oriented x3 in no acute distress.  Dermatology: Skin is warm, dry and supple bilateral lower extremities. Interspaces are clear of maceration and debris.  Flexor tenotomy sites well-healed.  Right fifth toe there is preulcerative callus present to the distal tuft.  Vascular: Palpable pedal pulses bilaterally. Capillary refill within normal limits.  No appreciable edema.  No erythema or calor.  Neurological: Light touch sensation grossly intact bilateral feet.   Musculoskeletal Exam: Improved alignment of left foot digits 2 through 5, some residual contracture second digit.  Improved alignment of right foot digits 2 through 4 with some residual contracture of second digit.  Does have semireducible flexor contracture of the right fifth toe that does cause pain in shoe gear.  Assessment/Plan of Care: 1. Hammertoe of right foot   2. Controlled type 2 diabetes mellitus without complication, unspecified whether long term insulin use (HCC)      Meds ordered this encounter  Medications   clindamycin  (CLEOCIN ) 300 MG capsule    Sig: Take 1 capsule (300 mg total) by mouth 3 (three) times daily for 3 days.    Dispense:  9 capsule    Refill:  0   None  Discussed clinical findings with patient  today.  Hammertoe right, 5th toe -Discussed proceeding with flexor tenotomy procedure. Patient agrees to proceed. -Consent form reviewed and signed. -Proceed with flexor tenotomy as below - Patient to leave bandage intact until follow-up visit in 1 week.  Ambulate as tolerated in surgical shoe. - This procedure and problem is unrelated to the other lesser toes of both feet which have been successfully treated with flexor tenotomy this point.  Patient has expressed good satisfaction with these.  Procedure: Flexor Tenotomy Indication for Procedure: toe with semi-reducible hammertoe with preulcerative callus to  distal tuft. Flexor tenotomy indicated to alleviate contracture, reduce pressure, and reduce the risk of ulceration Location: Right fifth toe Anesthesia: Lidocaine  1% plain; 1.5 mL and Marcaine 0.5% plain; 2.0 mL digital block Instrumentation: 18 g needle  Technique: The toe was anesthetized as above and prepped in the usual fashion with Betadine. An 18g needle was then used to percutaneously release the flexor tendon at the plantar surface of the toe with noted release of the hammertoe deformity.  The incision was dressed with Dermabond skin glue and compressive splint dressing with dry gauze and Coban was applied. Dressing: Dry, sterile, compression dressing. Disposition: Patient tolerated procedure well.    Raquon Milledge L. Lamount MAUL, AACFAS [facility]     [address] [place], [place] [address]                Office [phone]  Fax [phone]

## 2023-08-13 ENCOUNTER — Other Ambulatory Visit: Payer: Self-pay | Admitting: Family Medicine

## 2023-08-13 DIAGNOSIS — E782 Mixed hyperlipidemia: Secondary | ICD-10-CM

## 2023-08-13 DIAGNOSIS — E1159 Type 2 diabetes mellitus with other circulatory complications: Secondary | ICD-10-CM

## 2023-08-13 DIAGNOSIS — R195 Other fecal abnormalities: Secondary | ICD-10-CM

## 2023-08-19 ENCOUNTER — Ambulatory Visit: Admitting: Podiatry

## 2023-08-19 ENCOUNTER — Encounter: Payer: Self-pay | Admitting: Podiatry

## 2023-08-19 DIAGNOSIS — M2041 Other hammer toe(s) (acquired), right foot: Secondary | ICD-10-CM

## 2023-08-19 MED ORDER — CLINDAMYCIN HCL 300 MG PO CAPS: 300.0000 mg | ORAL_CAPSULE | Freq: Three times a day (TID) | ORAL | 0 refills | Status: AC

## 2023-08-19 NOTE — Progress Notes (Unsigned)
 Subjective:  Patient ID: Connie Marks, female    DOB: [birth year],  MRN: [id number]  Chief Complaint  Patient presents with   Post-op Follow-up    Right 5th flexor tenotomy. Doing well, wearing surgical shoe. Some dried blood on dressing when I removed it.     DOS: 08/12/2023 Procedure: Right fifth toe flexor tenotomy  78 y.o. female returns for post-op check.  She reports doing well.  Pain well-controlled.  Did notice some blood on the bandage when it was removed today.  Review of Systems: Negative except as noted in the HPI. Denies N/V/F/Ch.  Past Medical History:  Diagnosis Date   Allergy 07/1989   Foam tape welts   Anxiety    Arthritis 2009   Cataract 2022   Closed fracture of phalanx of foot 12/21/2019   Depression    GERD (gastroesophageal reflux disease)    Hyperlipidemia    Prediabetes    Sepsis secondary to UTI (HCC) 11/27/2022   Sleep apnea    uses CPAP every night    Current Outpatient Medications:    aspirin EC 81 MG tablet, Take 81 mg by mouth daily. Swallow whole., Disp: , Rfl:    buPROPion  (WELLBUTRIN  SR) 150 MG 12 hr tablet, Take 1 tablet (150 mg total) by mouth 2 (two) times daily., Disp: 180 tablet, Rfl: 1   Calcium Carbonate+Vitamin D 600-200 MG-UNIT TABS, Take 1 tablet by mouth daily., Disp: , Rfl:    Carboxymethylcellulose Sod PF (REFRESH PLUS) 0.5 % SOLN, , Disp: , Rfl:    citalopram  (CELEXA ) 20 MG tablet, Take 1 tablet (20 mg total) by mouth daily., Disp: 90 tablet, Rfl: 0   clindamycin  (CLEOCIN ) 300 MG capsule, Take 1 capsule (300 mg total) by mouth 3 (three) times daily for 7 days., Disp: 21 capsule, Rfl: 0   famotidine  (PEPCID ) 40 MG tablet, Take 40 mg by mouth at bedtime., Disp: , Rfl:    Fe Bisgly-Vit C-Vit B12-FA (GENTLE IRON) 28-60-0.008-0.4 MG CAPS, Take 1 capsule by mouth daily., Disp: , Rfl:    lansoprazole  (PREVACID ) 30 MG capsule, Take 1 capsule (30 mg total) by mouth daily at 12 noon., Disp: 90 capsule, Rfl: 3   Multiple  Vitamins-Minerals (MULTIVITAL PO), Take by mouth., Disp: , Rfl:    OZEMPIC , 0.25 OR 0.5 MG/DOSE, 2 MG/3ML SOPN, INJECT 0.5 MG INTO THE SKIN ONCE A WEEK., Disp: 3 mL, Rfl: 0   pramipexole  (MIRAPEX ) 0.5 MG tablet, TAKE ONE TABLET BY MOUTH THREE TIMES DAILY, Disp: 90 tablet, Rfl: 1   simvastatin  (ZOCOR ) 40 MG tablet, TAKE ONE TABLET BY MOUTH ONCE DAILY, Disp: 90 tablet, Rfl: 0   traZODone  (DESYREL ) 50 MG tablet, TAKE 1/2 TO 1 TABLET BY MOUTH AT BEDTIME AS NEEDED FOR SLEEP, Disp: 90 tablet, Rfl: 1   valACYclovir  (VALTREX ) 1000 MG tablet, Take 2 tablets (2,000 mg total) by mouth 2 (two) times daily at onset, Disp: 24 tablet, Rfl: 0  Social History   Tobacco Use  Smoking Status Former   Current packs/day: 0.00   Average packs/day: 1 pack/day for 15.0 years (15.0 ttl pk-yrs)   Types: Cigarettes   Start date: 12/19/1970   Quit date: 12/18/1985   Years since quitting: 37.7  Smokeless Tobacco Never    Allergies  Allergen Reactions   Adhesive [Tape]     Red whelt   Objective:  There were no vitals filed for this visit. There is no height or weight on file to calculate BMI. Constitutional Well developed. Well nourished.  Vascular Foot warm and well perfused. Capillary refill normal to all digits.   Neurologic Normal speech. Oriented to person, place, and time. Epicritic sensation to light touch grossly present bilaterally.  Dermatologic Tenotomy site not quite healed at this point with scant fibrous slough noted.  No surrounding erythema or edema.  Orthopedic: Mild tenderness to palpation noted about the surgical site.  Right fifth toe sitting in improved rectus position.   Assessment:   1. Hammertoe of right foot    Plan:  Patient was evaluated and treated and all questions answered.  S/p right fifth toe flexor tenotomy -Progressing as expected post-operatively. -XR: Deferred -WB Status: Weightbearing as tolerated in surgical shoe - Tenotomy site was cleansed with Betadine and  Dermabond was reapplied.  Foot was redressed. -Medications: Will continue on clindamycin  as precaution as surrounding site has not fully healed, patient is diabetic.  7 days clindamycin  300 mg 3 times daily.  Return in about 1 week (around 08/26/2023) for Post Op Check.

## 2023-08-20 ENCOUNTER — Encounter: Payer: Self-pay | Admitting: Family Medicine

## 2023-08-26 ENCOUNTER — Other Ambulatory Visit: Payer: Self-pay | Admitting: Family Medicine

## 2023-08-26 ENCOUNTER — Encounter: Payer: Self-pay | Admitting: Podiatry

## 2023-08-26 ENCOUNTER — Ambulatory Visit: Admitting: Podiatry

## 2023-08-26 DIAGNOSIS — E119 Type 2 diabetes mellitus without complications: Secondary | ICD-10-CM

## 2023-08-26 DIAGNOSIS — G4709 Other insomnia: Secondary | ICD-10-CM

## 2023-08-26 DIAGNOSIS — M2041 Other hammer toe(s) (acquired), right foot: Secondary | ICD-10-CM

## 2023-08-26 NOTE — Progress Notes (Signed)
 Subjective:  Patient ID: Connie Marks, female    DOB: [birth year],  MRN: [id number]  Chief Complaint  Patient presents with   Routine Post Op    DOS 08/12/23. Looks pretty good today, she did bring her shoe today. Bandage was stuck but came off with saline. Last A1c was in May5.8 ASA    DOS: 08/12/2023 Procedure: Right fifth toe flexor tenotomy  78 y.o. female returns for post-op check.  She reports doing well.  Pain well-controlled.  She feels that is progressing well.  Did not notice any bleeding on bandage.  Review of Systems: Negative except as noted in the HPI. Denies N/V/F/Ch.  Past Medical History:  Diagnosis Date   Allergy 07/1989   Foam tape welts   Anxiety    Arthritis 2009   Cataract 2022   Closed fracture of phalanx of foot 12/21/2019   Depression    GERD (gastroesophageal reflux disease)    Hyperlipidemia    Prediabetes    Sepsis secondary to UTI (HCC) 11/27/2022   Sleep apnea    uses CPAP every night    Current Outpatient Medications:    aspirin EC 81 MG tablet, Take 81 mg by mouth daily. Swallow whole., Disp: , Rfl:    buPROPion  (WELLBUTRIN  SR) 150 MG 12 hr tablet, Take 1 tablet (150 mg total) by mouth 2 (two) times daily., Disp: 180 tablet, Rfl: 1   Calcium Carbonate+Vitamin D 600-200 MG-UNIT TABS, Take 1 tablet by mouth daily., Disp: , Rfl:    Carboxymethylcellulose Sod PF (REFRESH PLUS) 0.5 % SOLN, , Disp: , Rfl:    citalopram  (CELEXA ) 20 MG tablet, Take 1 tablet (20 mg total) by mouth daily., Disp: 90 tablet, Rfl: 0   clindamycin  (CLEOCIN ) 300 MG capsule, Take 1 capsule (300 mg total) by mouth 3 (three) times daily for 7 days., Disp: 21 capsule, Rfl: 0   famotidine  (PEPCID ) 40 MG tablet, Take 40 mg by mouth at bedtime., Disp: , Rfl:    Fe Bisgly-Vit C-Vit B12-FA (GENTLE IRON) 28-60-0.008-0.4 MG CAPS, Take 1 capsule by mouth daily., Disp: , Rfl:    lansoprazole  (PREVACID ) 30 MG capsule, Take 1 capsule (30 mg total) by mouth daily at 12 noon., Disp: 90  capsule, Rfl: 3   Multiple Vitamins-Minerals (MULTIVITAL PO), Take by mouth., Disp: , Rfl:    OZEMPIC , 0.25 OR 0.5 MG/DOSE, 2 MG/3ML SOPN, INJECT 0.5 MG INTO THE SKIN ONCE A WEEK., Disp: 3 mL, Rfl: 0   pramipexole  (MIRAPEX ) 0.5 MG tablet, TAKE ONE TABLET BY MOUTH THREE TIMES DAILY, Disp: 90 tablet, Rfl: 1   simvastatin  (ZOCOR ) 40 MG tablet, TAKE ONE TABLET BY MOUTH ONCE DAILY, Disp: 90 tablet, Rfl: 0   traZODone  (DESYREL ) 50 MG tablet, TAKE 1/2 TO 1 TABLET BY MOUTH AT BEDTIME AS NEEDED FOR SLEEP, Disp: 90 tablet, Rfl: 1   valACYclovir  (VALTREX ) 1000 MG tablet, Take 2 tablets (2,000 mg total) by mouth 2 (two) times daily at onset, Disp: 24 tablet, Rfl: 0  Social History   Tobacco Use  Smoking Status Former   Current packs/day: 0.00   Average packs/day: 1 pack/day for 15.0 years (15.0 ttl pk-yrs)   Types: Cigarettes   Start date: 12/19/1970   Quit date: 12/18/1985   Years since quitting: 37.7  Smokeless Tobacco Never    Allergies  Allergen Reactions   Adhesive [Tape]     Red whelt   Objective:  There were no vitals filed for this visit. There is no height or weight on  file to calculate BMI. Constitutional Well developed. Well nourished.  Vascular Foot warm and well perfused. Capillary refill normal to all digits.   Neurologic Normal speech. Oriented to person, place, and time. Epicritic sensation to light touch grossly present bilaterally.  Dermatologic Tenotomy site plantar aspect of right fifth toe healing well.  There is new epithelialization at the tenotomy site, this does appear somewhat fragile.  No signs of acute infection.  Orthopedic: Minimal tenderness to palpation noted about the surgical site.  Right fifth toe sitting in improved rectus position.   Assessment:   1. Hammertoe of right foot   2. Controlled type 2 diabetes mellitus without complication, unspecified whether long term insulin use (HCC)    Plan:  Patient was evaluated and treated and all questions  answered.  S/p right fifth toe flexor tenotomy -Progressing as expected post-operatively. -XR: Deferred -WB Status: Transition to weightbearing as tolerated in surgical shoes - Tenotomy site was bacitracin and bandage today for fragile friable new skin.  Can continue to keep covered for the next 3 days and discontinue bandage at that time - Pain well-controlled  Return in about 4 weeks (around 09/23/2023) for Diabetic Foot Care.

## 2023-08-30 ENCOUNTER — Encounter: Payer: Self-pay | Admitting: Family Medicine

## 2023-09-16 ENCOUNTER — Other Ambulatory Visit: Payer: Self-pay | Admitting: Family Medicine

## 2023-09-16 DIAGNOSIS — E1159 Type 2 diabetes mellitus with other circulatory complications: Secondary | ICD-10-CM

## 2023-09-17 ENCOUNTER — Encounter: Payer: Self-pay | Admitting: Gastroenterology

## 2023-09-17 ENCOUNTER — Other Ambulatory Visit: Payer: Self-pay | Admitting: Family Medicine

## 2023-09-17 MED ORDER — SEMAGLUTIDE (1 MG/DOSE) 4 MG/3ML ~~LOC~~ SOPN
1.0000 mg | PEN_INJECTOR | SUBCUTANEOUS | 2 refills | Status: DC
Start: 2023-09-17 — End: 2023-09-20

## 2023-09-20 ENCOUNTER — Other Ambulatory Visit: Payer: Self-pay | Admitting: Family Medicine

## 2023-09-20 NOTE — Telephone Encounter (Unsigned)
 Copied from CRM #[id number]. Topic: Clinical - Prescription Issue >> Sep 20, 2023 12:37 PM Tobias CROME wrote: Reason for CRM: Patient states her prescription for the ozempic  was sent to the wrong pharmacy.   Patient requesting to be sent to [facility] - [address] [place] [address], patient states she usually gets it from that pharmacy.

## 2023-09-23 ENCOUNTER — Ambulatory Visit: Admitting: Podiatry

## 2023-09-23 ENCOUNTER — Encounter: Payer: Self-pay | Admitting: Podiatry

## 2023-09-23 DIAGNOSIS — M79675 Pain in left toe(s): Secondary | ICD-10-CM

## 2023-09-23 DIAGNOSIS — M79674 Pain in right toe(s): Secondary | ICD-10-CM | POA: Diagnosis not present

## 2023-09-23 DIAGNOSIS — B351 Tinea unguium: Secondary | ICD-10-CM

## 2023-09-23 DIAGNOSIS — E1140 Type 2 diabetes mellitus with diabetic neuropathy, unspecified: Secondary | ICD-10-CM

## 2023-09-23 MED ORDER — SEMAGLUTIDE (1 MG/DOSE) 4 MG/3ML ~~LOC~~ SOPN: 1.0000 mg | PEN_INJECTOR | SUBCUTANEOUS | 2 refills | Status: DC

## 2023-09-23 NOTE — Progress Notes (Signed)
  Subjective:  Patient ID: Connie Marks, female    DOB: [birth year],  MRN: [id number]  Chief Complaint  Patient presents with   [facility]    [facility] with callous. Last A1c 5.8 in June, ASA  DOS: 08/19/23 Procedure: Right 5th toe flexor tenotomy  78 y.o. female presents with the above complaint. History confirmed with patient. Patient presenting with pain related to dystrophic thickened elongated nails. Patient is unable to trim own nails related to nail dystrophy and/or mobility issues. Patient does have a history of T2DM. Well controlled, last A1c 5.8. Recovering well from her flexor tenotomy procedures, she denies pain.  Objective:  Physical Exam: warm, good capillary refill, decreased pedal hair growth. nail exam onychomycosis of the toenails, onycholysis, and dystrophic nails DP pulses palpable, PT pulses palpable, protective sensation absent, and vibratory sensation diminished Left Foot:  Pain with palpation of nails due to elongation and dystrophic growth.  Right Foot: Pain with palpation of nails due to elongation and dystrophic growth.   Assessment:   1. Pain due to onychomycosis of toenails of both feet   2. Controlled type 2 diabetes with neuropathy [facility])      Plan:  Patient was evaluated and treated and all questions answered.  #Onychomycosis with pain  -Nails palliatively debrided as below. -Educated on self-care  Procedure: Nail Debridement Rationale: Pain Type of Debridement: manual, sharp debridement. Instrumentation: Nail nipper, rotary burr. Number of Nails: 10  Patient educated on diabetes. Discussed proper diabetic foot care and discussed risks and complications of disease. Educated patient in depth on reasons to return to the office immediately should he/she discover anything concerning or new on the feet. All questions answered. Discussed proper shoes as well.   She is doing well without complaint following flexor tenotomy procedures for hammertoes.  Return in  about 3 months (around 12/24/2023) for Diabetic Foot Care.         Ethan Saddler, DPM [facility] / [facility]

## 2023-10-04 DIAGNOSIS — H43813 Vitreous degeneration, bilateral: Secondary | ICD-10-CM | POA: Diagnosis not present

## 2023-10-04 DIAGNOSIS — H2512 Age-related nuclear cataract, left eye: Secondary | ICD-10-CM | POA: Diagnosis not present

## 2023-10-04 DIAGNOSIS — Z961 Presence of intraocular lens: Secondary | ICD-10-CM | POA: Diagnosis not present

## 2023-10-04 DIAGNOSIS — S0231XS Fracture of orbital floor, right side, sequela: Secondary | ICD-10-CM | POA: Diagnosis not present

## 2023-10-04 LAB — OPHTHALMOLOGY REPORT-SCANNED

## 2023-10-10 NOTE — Progress Notes (Signed)
   10/10/2023  Patient ID: Connie Marks, female   DOB: 1946/01/28, 78 y.o.   MRN: [id number]  Pharmacy Quality Measure Review  This patient is appearing on a report for being at risk of failing the adherence measure for diabetes medications this calendar year.   Medication: ozempic  last filled 09/23/2023 28 DS [employer] report was not up to date. No action needed at this time.   Lang Sieve, PharmD, BCGP Clinical Pharmacist  [phone]

## 2023-10-22 DIAGNOSIS — H2512 Age-related nuclear cataract, left eye: Secondary | ICD-10-CM | POA: Diagnosis not present

## 2023-10-24 DIAGNOSIS — H2512 Age-related nuclear cataract, left eye: Secondary | ICD-10-CM | POA: Diagnosis not present

## 2023-11-13 ENCOUNTER — Other Ambulatory Visit: Payer: Self-pay | Admitting: Family Medicine

## 2023-11-13 DIAGNOSIS — E782 Mixed hyperlipidemia: Secondary | ICD-10-CM

## 2023-11-14 DIAGNOSIS — R195 Other fecal abnormalities: Secondary | ICD-10-CM | POA: Insufficient documentation

## 2023-11-14 NOTE — Progress Notes (Unsigned)
 Subjective:  Patient ID: Connie Marks, female    DOB: Dec 02, 1945  Age: 78 y.o. MRN: [id number]  No chief complaint on file.   HPI: Discussed the use of AI scribe software for clinical note transcription with the patient, who gave verbal consent to proceed.  History of Present Illness Connie Marks is a 78 year old female who presents for follow-up and management of multiple chronic conditions.  Colorectal symptoms and screening - Positive Cologuard test with further evaluation scheduled - Occasional rectal bleeding during bowel movements, attributed to irritation - History of a bump removal in the rectal area, thought to be related to an old femur injury  Renal dysfunction and anemia - Chronic kidney dysfunction - History of anemia, currently taking iron supplementation regularly  Recent trauma and musculoskeletal pain - Recent fall resulting in buttock pain, described as bruised - History of arthritis affecting toes and fingers with occasional pain, managed with Tylenol  or Voltaren  Depressive symptoms and sleep disturbance - Takes Wellbutrin  150 mg twice daily and citalopram  (halved dose) for depression - Uses trazodone  nightly for sleep, but generally sleeps well and is unsure if trazodone  is necessary  Gastroesophageal reflux symptoms - Takes Prevacid  in the morning and Pepcid  at night for acid reflux  Metabolic and cardiovascular management - Takes Ozempic  1 mg weekly, with good tolerance, decreased appetite, and some weight loss - Maintains a diet of at least two meals daily, avoids snacks such as chips and pretzels - Takes simvastatin  40 mg daily for hyperlipidemia - Takes aspirin 81 mg daily  Restless leg syndrome - Takes pramipexole  for restless leg syndrome  Ophthalmologic history - Recent cataract surgery - Plans to obtain new glasses soon  General constitutional and respiratory symptoms - No fevers, chills, sweats, sore throat, or respiratory  issues - Uses CPAP machine regularly       11/15/2023    8:55 AM 07/16/2023    9:28 AM 05/16/2023    2:10 PM 04/11/2023    9:03 AM 01/09/2023    9:54 AM  Depression screen PHQ 2/9  Decreased Interest 0 1 0 1 1  Down, Depressed, Hopeless 0 0 0 0 0  PHQ - 2 Score 0 1 0 1 1  Altered sleeping 0 1 2 2 1   Tired, decreased energy 0 1 1 1 2   Change in appetite 0 0 0 0 0  Feeling bad or failure about yourself  0 0 0 1 0  Trouble concentrating 0 0 0 0 0  Moving slowly or fidgety/restless 0 0 0 0 0  Suicidal thoughts 0 0 0 0 0  PHQ-9 Score 0 3 3 5 4   Difficult doing work/chores Not difficult at all  Somewhat difficult Somewhat difficult Somewhat difficult        11/15/2023    8:55 AM  Fall Risk   Falls in the past year? 1  Number falls in past yr: 0  Injury with Fall? 0  Risk for fall due to : No Fall Risks  Follow up Falls evaluation completed    Patient Care Team: Sherre Clapper, MD as PCP - General (Internal Medicine) Octavia Bruckner, MD as Consulting Physician (Ophthalmology) Kit Rush, MD as Consulting Physician (Orthopedic Surgery)   Review of Systems  All other systems reviewed and are negative.   Current Outpatient Medications on File Prior to Visit  Medication Sig Dispense Refill   aspirin EC 81 MG tablet Take 81 mg by mouth daily. Swallow whole.  buPROPion  (WELLBUTRIN  SR) 150 MG 12 hr tablet Take 1 tablet (150 mg total) by mouth 2 (two) times daily. 180 tablet 1   Calcium Carbonate+Vitamin D 600-200 MG-UNIT TABS Take 1 tablet by mouth daily.     Carboxymethylcellulose Sod PF (REFRESH PLUS) 0.5 % SOLN      famotidine  (PEPCID ) 40 MG tablet Take 40 mg by mouth at bedtime.     Fe Bisgly-Vit C-Vit B12-FA (GENTLE IRON) 28-60-0.008-0.4 MG CAPS Take 1 capsule by mouth daily.     lansoprazole  (PREVACID ) 30 MG capsule Take 1 capsule (30 mg total) by mouth daily at 12 noon. 90 capsule 3   Multiple Vitamins-Minerals (MULTIVITAL PO) Take by mouth.     simvastatin  (ZOCOR ) 40  MG tablet TAKE ONE TABLET BY MOUTH ONCE DAILY 90 tablet 0   traZODone  (DESYREL ) 50 MG tablet TAKE 1/2 TO 1 TABLET BY MOUTH AT BEDTIME AS NEEDED FOR SLEEP 90 tablet 1   No current facility-administered medications on file prior to visit.   Past Medical History:  Diagnosis Date   Allergy 07/1989   Foam tape welts   Anxiety    Arthritis 2009   Cataract 2022   Closed fracture of phalanx of foot 12/21/2019   Depression    GERD (gastroesophageal reflux disease)    Hyperlipidemia    Prediabetes    Sepsis secondary to UTI (HCC) 11/27/2022   Sleep apnea    uses CPAP every night   Past Surgical History:  Procedure Laterality Date   CESAREAN SECTION     DIAGNOSTIC LAPAROSCOPY     EYE SURGERY  2022   FLAT FOOT RECONSTRUCTION-TAL GASTROC RECESSION Right 09/25/2021   In [place], [place]   FRACTURE SURGERY  1960   HERNIA REPAIR     JOINT REPLACEMENT     knee replacements bilat   JOINT REPLACEMENT     hip replacements bilat   LIPOMA EXCISION     Dr Joesph   ORIF ORBITAL FRACTURE Right 12/19/2018   Procedure: OPEN TREATMENT RIGHT ORBITAL FLOOR WITH IMPLANT, PERIORBITAL APPROACH;  Surgeon: Arelia Filippo, MD;  Location: [facility];  Service: Plastics;  Laterality: Right;   ORIF ORBITAL FRACTURE Right 02/03/2019   Procedure: OPEN TREATMENT RIGHT ORBITAL FLOOR FRACTURE REVISION, PERI ORBITAL APPROACH;  Surgeon: Arelia Filippo, MD;  Location: [facility];  Service: Plastics;  Laterality: Right;    Family History  Problem Relation Age of Onset   Varicose Veins Mother    Diabetes Father    COPD Father    Heart disease Father    Pancreatic cancer Maternal Grandmother    Arthritis Maternal Grandmother    Cancer Maternal Grandmother    Arthritis Paternal Grandmother    Hyperlipidemia Paternal Aunt    Breast cancer Neg Hx    Social History   Socioeconomic History   Marital status: Divorced    Spouse name: Not on file   Number of children: 2   Years of  education: Not on file   Highest education level: Bachelor's degree (e.g., BA, AB, BS)  Occupational History   Not on file  Tobacco Use   Smoking status: Former    Current packs/day: 0.00    Average packs/day: 1 pack/day for 15.0 years (15.0 ttl pk-yrs)    Types: Cigarettes    Start date: 12/19/1970    Quit date: 12/18/1985    Years since quitting: 37.9   Smokeless tobacco: Never  Vaping Use   Vaping status: Never Used  Substance and  Sexual Activity   Alcohol use: Yes    Alcohol/week: 3.0 standard drinks of alcohol    Types: 2 Glasses of wine, 1 Shots of liquor per week    Comment: 2 glasses /week   Drug use: Never   Sexual activity: Not Currently    Birth control/protection: Abstinence  Other Topics Concern   Not on file  Social History Narrative   Daughter #1 - lives in [place], Daughter #2 - lives in [place], mother lives in [place]    Social Drivers of Health   Financial Resource Strain: Medium Risk (04/10/2023)   Overall Financial Resource Strain (CARDIA)    Difficulty of Paying Living Expenses: Somewhat hard  Food Insecurity: No Food Insecurity (04/10/2023)   Hunger Vital Sign    Worried About Running Out of Food in the Last Year: Never true    Ran Out of Food in the Last Year: Never true  Transportation Needs: No Transportation Needs (04/10/2023)   PRAPARE - Administrator, Civil Service (Medical): No    Lack of Transportation (Non-Medical): No  Physical Activity: Inactive (04/10/2023)   Exercise Vital Sign    Days of Exercise per Week: 0 days    Minutes of Exercise per Session: 10 min  Stress: No Stress Concern Present (04/10/2023)   [employer] of Occupational Health - Occupational Stress Questionnaire    Feeling of Stress : Only a little  Social Connections: Moderately Isolated (04/10/2023)   Social Connection and Isolation Panel    Frequency of Communication with Friends and Family: More than three times a week    Frequency of Social Gatherings with  Friends and Family: Twice a week    Attends Religious Services: Never    Diplomatic Services operational officer: No    Attends Engineer, structural: More than 4 times per year    Marital Status: Divorced    Objective:  BP 132/72   Pulse 67   Temp 98 F (36.7 C)   Ht 5' 4 (1.626 m)   Wt 193 lb (87.5 kg)   SpO2 98%   BMI 33.13 kg/m      11/15/2023    8:53 AM 07/16/2023    9:17 AM 04/11/2023    9:02 AM  BP/Weight  Systolic BP 132 100 122  Diastolic BP 72 60 82  Wt. (Lbs) 193 195 214  BMI 33.13 kg/m2 33.47 kg/m2 36.73 kg/m2    Physical Exam Vitals reviewed.  Constitutional:      Appearance: Normal appearance.  Neck:     Vascular: No carotid bruit.  Cardiovascular:     Rate and Rhythm: Normal rate and regular rhythm.     Pulses: Normal pulses.     Heart sounds: Normal heart sounds.  Pulmonary:     Effort: Pulmonary effort is normal. No respiratory distress.     Breath sounds: Normal breath sounds.  Abdominal:     General: Abdomen is flat. Bowel sounds are normal.     Palpations: Abdomen is soft.     Tenderness: There is no abdominal tenderness.  Neurological:     Mental Status: She is alert and oriented to person, place, and time.  Psychiatric:        Mood and Affect: Mood normal.        Behavior: Behavior normal.     {Perform Simple Foot Exam  Perform Detailed exam:1} Diabetic foot exam was performed with the following findings:   No deformities, ulcerations, or other skin breakdown  Normal sensation of 10g monofilament Intact posterior tibialis and dorsalis pedis pulses      Lab Results  Component Value Date   WBC 7.8 11/15/2023   HGB 14.0 11/15/2023   HCT 43.8 11/15/2023   PLT 220 11/15/2023   GLUCOSE 93 11/15/2023   CHOL 163 11/15/2023   TRIG 180 (H) 11/15/2023   HDL 56 11/15/2023   LDLCALC 77 11/15/2023   ALT 19 11/15/2023   AST 27 11/15/2023   NA 143 11/15/2023   K 4.8 11/15/2023   CL 105 11/15/2023   CREATININE 1.03 (H)  11/15/2023   BUN 13 11/15/2023   CO2 25 11/15/2023   TSH 1.140 07/16/2023   HGBA1C 5.8 11/15/2023    Results for orders placed or performed in visit on 11/15/23  POCT glycosylated hemoglobin (Hb A1C)   Collection Time: 11/15/23  9:20 AM  Result Value Ref Range   Hemoglobin A1C     HbA1c POC (<> result, manual entry) 5.8 4.0 - 5.6 %   HbA1c, POC (prediabetic range)     HbA1c, POC (controlled diabetic range)    Comprehensive metabolic panel with GFR   Collection Time: 11/15/23  9:23 AM  Result Value Ref Range   Glucose 93 70 - 99 mg/dL   BUN 13 8 - 27 mg/dL   Creatinine, Ser 8.96 (H) 0.57 - 1.00 mg/dL   eGFR 56 (L) >40 fO/fpw/8.26   BUN/Creatinine Ratio 13 12 - 28   Sodium 143 134 - 144 mmol/L   Potassium 4.8 3.5 - 5.2 mmol/L   Chloride 105 96 - 106 mmol/L   CO2 25 20 - 29 mmol/L   Calcium 9.6 8.7 - 10.3 mg/dL   Total Protein 6.5 6.0 - 8.5 g/dL   Albumin 4.3 3.8 - 4.8 g/dL   Globulin, Total 2.2 1.5 - 4.5 g/dL   Bilirubin Total 0.5 0.0 - 1.2 mg/dL   Alkaline Phosphatase 71 49 - 135 IU/L   AST 27 0 - 40 IU/L   ALT 19 0 - 32 IU/L  CBC with Differential/Platelet   Collection Time: 11/15/23  9:23 AM  Result Value Ref Range   WBC 7.8 3.4 - 10.8 x10E3/uL   RBC 4.57 3.77 - 5.28 x10E6/uL   Hemoglobin 14.0 11.1 - 15.9 g/dL   Hematocrit 56.1 65.9 - 46.6 %   MCV 96 79 - 97 fL   MCH 30.6 26.6 - 33.0 pg   MCHC 32.0 31.5 - 35.7 g/dL   RDW 85.4 88.2 - 84.5 %   Platelets 220 150 - 450 x10E3/uL   Neutrophils 62 Not Estab. %   Lymphs 25 Not Estab. %   Monocytes 10 Not Estab. %   Eos 2 Not Estab. %   Basos 1 Not Estab. %   Neutrophils Absolute 4.8 1.4 - 7.0 x10E3/uL   Lymphocytes Absolute 2.0 0.7 - 3.1 x10E3/uL   Monocytes Absolute 0.8 0.1 - 0.9 x10E3/uL   EOS (ABSOLUTE) 0.2 0.0 - 0.4 x10E3/uL   Basophils Absolute 0.1 0.0 - 0.2 x10E3/uL   Immature Granulocytes 0 Not Estab. %   Immature Grans (Abs) 0.0 0.0 - 0.1 x10E3/uL  Lipid panel   Collection Time: 11/15/23  9:24 AM  Result  Value Ref Range   Cholesterol, Total 163 100 - 199 mg/dL   Triglycerides 819 (H) 0 - 149 mg/dL   HDL 56 >60 mg/dL   VLDL Cholesterol Cal 30 5 - 40 mg/dL   LDL Chol Calc (NIH) 77 0 - 99 mg/dL  Chol/HDL Ratio 2.9 0.0 - 4.4 ratio  .  Assessment & Plan:   Assessment & Plan Mixed hyperlipidemia Managed with simvastatin  40 mg daily. - Check cholesterol level today.    Gastroesophageal reflux disease without esophagitis Managed with Prevacid  and Pepcid . Prefers current regimen due to effective symptom control.    Combined hyperlipidemia associated with type 2 diabetes mellitus (HCC) Good tolerance to Ozempic  with weight loss and appetite suppression. No significant adverse effects. - Increase Ozempic  dose for weight loss and glycemic control. - Check A1c level today. Orders:   POCT glycosylated hemoglobin (Hb A1C)   Comprehensive metabolic panel with GFR   CBC with Differential/Platelet   Lipid panel  RLS (restless legs syndrome) Managed with pramipexole  at bedtime. Orders:   pramipexole  (MIRAPEX ) 0.5 MG tablet; Take 1 tablet (0.5 mg total) by mouth at bedtime.  OSA on CPAP Managed with CPAP.    Mild episode of recurrent major depressive disorder Managed with Wellbutrin  and citalopram . Current regimen effective. Orders:   citalopram  (CELEXA ) 20 MG tablet; Take 1 tablet (20 mg total) by mouth daily.  Chronic kidney disease, stage 3a (HCC) Previous labs indicated some dysfunction. - Recheck kidney function tests.    Arthritis of foot, unspecified laterality Occasional pain managed with Tylenol  and Voltaren as needed.    Arthritis of hand, unspecified laterality Occasional pain managed with Tylenol  and Voltaren as needed.    Microcytic anemia Improvement noted four months ago. Taking iron supplements regularly. - Recheck complete blood count.    Positive colorectal cancer screening using Cologuard test Positive Cologuard test with suspected lower GI source.  Occasional rectal bleeding reported. - Upcoming appointment for further evaluation.    Encounter for immunization  Orders:   Flu vaccine HIGH DOSE PF(Fluzone Trivalent)  Encounter for immunization  Orders:   Pfizer Comirnaty Covid-19 Vaccine 59yrs & older    Body mass index is 33.13 kg/m.   Meds ordered this encounter  Medications   citalopram  (CELEXA ) 20 MG tablet    Sig: Take 1 tablet (20 mg total) by mouth daily.    Dispense:  90 tablet    Refill:  0   pramipexole  (MIRAPEX ) 0.5 MG tablet    Sig: Take 1 tablet (0.5 mg total) by mouth at bedtime.    This prescription was filled on 07/15/2023. Any refills authorized will be placed on file.   Semaglutide , 2 MG/DOSE, 8 MG/3ML SOPN    Sig: Inject 2 mg as directed once a week.    Dispense:  9 mL    Refill:  0    Orders Placed This Encounter  Procedures   Flu vaccine HIGH DOSE PF(Fluzone Trivalent)   Pfizer Comirnaty Covid-19 Vaccine 96yrs & older   Comprehensive metabolic panel with GFR   CBC with Differential/Platelet   Lipid panel   POCT glycosylated hemoglobin (Hb A1C)    I,Marla I Leal-Borjas,acting as a scribe for Abigail Free, MD.,have documented all relevant documentation on the behalf of Abigail Free, MD,as directed by  Abigail Free, MD while in the presence of Abigail Free, MD.    Follow-up: Return in about 4 months (around 03/17/2024) for chronic follow up.  An After Visit Summary was printed and given to the patient.  Abigail Free, MD [facility] [phone]

## 2023-11-14 NOTE — Assessment & Plan Note (Addendum)
 Managed with Prevacid  and Pepcid . Prefers current regimen due to effective symptom control.

## 2023-11-14 NOTE — Assessment & Plan Note (Deleted)
 Managed with simvastatin  40 mg daily. - Check cholesterol level today.

## 2023-11-14 NOTE — Assessment & Plan Note (Addendum)
 Good tolerance to Ozempic  with weight loss and appetite suppression. No significant adverse effects. - Increase Ozempic  dose for weight loss and glycemic control. - Check A1c level today. Orders:   POCT glycosylated hemoglobin (Hb A1C)   Comprehensive metabolic panel with GFR   CBC with Differential/Platelet   Lipid panel

## 2023-11-14 NOTE — Assessment & Plan Note (Addendum)
 Managed with Wellbutrin  and citalopram . Current regimen effective. Orders:   citalopram  (CELEXA ) 20 MG tablet; Take 1 tablet (20 mg total) by mouth daily.

## 2023-11-14 NOTE — Assessment & Plan Note (Signed)
 Connie Marks

## 2023-11-14 NOTE — Assessment & Plan Note (Addendum)
Managed with CPAP.

## 2023-11-15 ENCOUNTER — Ambulatory Visit: Admitting: Family Medicine

## 2023-11-15 ENCOUNTER — Encounter: Payer: Self-pay | Admitting: Family Medicine

## 2023-11-15 VITALS — BP 132/72 | HR 67 | Temp 98.0°F | Ht 64.0 in | Wt 193.0 lb

## 2023-11-15 DIAGNOSIS — E782 Mixed hyperlipidemia: Secondary | ICD-10-CM

## 2023-11-15 DIAGNOSIS — D509 Iron deficiency anemia, unspecified: Secondary | ICD-10-CM

## 2023-11-15 DIAGNOSIS — Z23 Encounter for immunization: Secondary | ICD-10-CM

## 2023-11-15 DIAGNOSIS — G4733 Obstructive sleep apnea (adult) (pediatric): Secondary | ICD-10-CM

## 2023-11-15 DIAGNOSIS — R195 Other fecal abnormalities: Secondary | ICD-10-CM

## 2023-11-15 DIAGNOSIS — M19049 Primary osteoarthritis, unspecified hand: Secondary | ICD-10-CM

## 2023-11-15 DIAGNOSIS — E1169 Type 2 diabetes mellitus with other specified complication: Secondary | ICD-10-CM

## 2023-11-15 DIAGNOSIS — G2581 Restless legs syndrome: Secondary | ICD-10-CM | POA: Diagnosis not present

## 2023-11-15 DIAGNOSIS — N1831 Chronic kidney disease, stage 3a: Secondary | ICD-10-CM

## 2023-11-15 DIAGNOSIS — F330 Major depressive disorder, recurrent, mild: Secondary | ICD-10-CM

## 2023-11-15 DIAGNOSIS — M19079 Primary osteoarthritis, unspecified ankle and foot: Secondary | ICD-10-CM

## 2023-11-15 DIAGNOSIS — K219 Gastro-esophageal reflux disease without esophagitis: Secondary | ICD-10-CM | POA: Diagnosis not present

## 2023-11-15 LAB — POCT GLYCOSYLATED HEMOGLOBIN (HGB A1C): HbA1c POC (<> result, manual entry): 5.8 % (ref 4.0–5.6)

## 2023-11-15 MED ORDER — PRAMIPEXOLE DIHYDROCHLORIDE 0.5 MG PO TABS: 0.5000 mg | ORAL_TABLET | Freq: Every day | ORAL | Status: DC

## 2023-11-15 MED ORDER — SEMAGLUTIDE (2 MG/DOSE) 8 MG/3ML ~~LOC~~ SOPN: 2.0000 mg | PEN_INJECTOR | SUBCUTANEOUS | 0 refills | Status: DC

## 2023-11-15 MED ORDER — CITALOPRAM HYDROBROMIDE 20 MG PO TABS: 20.0000 mg | ORAL_TABLET | Freq: Every day | ORAL | 0 refills | Status: AC

## 2023-11-15 NOTE — Assessment & Plan Note (Addendum)
 Managed with pramipexole  at bedtime. Orders:   pramipexole  (MIRAPEX ) 0.5 MG tablet; Take 1 tablet (0.5 mg total) by mouth at bedtime.

## 2023-11-15 NOTE — Patient Instructions (Signed)
  VISIT SUMMARY: Today, we reviewed and managed your multiple chronic conditions, including diabetes, kidney disease, hyperlipidemia, and others. We discussed your current medications and made some adjustments to optimize your treatment. We also planned follow-up tests and evaluations to monitor your health.  YOUR PLAN: TYPE 2 DIABETES MELLITUS: You are tolerating Ozempic  well, with weight loss and appetite suppression. -Increase Ozempic  dose for better weight loss -Check A1c level today.  CHRONIC KIDNEY DISEASE: Previous labs showed some kidney dysfunction. -Recheck kidney function tests.  MIXED HYPERLIPIDEMIA: You are taking simvastatin  40 mg daily to manage your cholesterol. -Check cholesterol level today.  OBSTRUCTIVE SLEEP APNEA: You are managing this condition with a CPAP machine. -Continue using your CPAP machine regularly.  GASTROESOPHAGEAL REFLUX DISEASE: You are taking Prevacid  and Pepcid , which are effectively controlling your symptoms. -Continue with your current medication regimen.  DEPRESSION: You are taking Wellbutrin  and citalopram , and your current regimen is effective. -Continue with your current medication regimen.  RESTLESS LEGS SYNDROME: You are managing this condition with pramipexole  at bedtime. -Continue taking pramipexole  as prescribed.  ARTHRITIS OF THE FOOT AND HAND: You have occasional pain, which you manage with Tylenol  and Voltaren as needed. -Continue using Tylenol  and Voltaren as needed for pain.  ANEMIA: You have a history of anemia and are taking iron supplements regularly. -Recheck complete blood count.  FECAL BLOOD, SUSPECTED LOWER GI SOURCE: You had a positive Cologuard test and occasional rectal bleeding. -Attend your upcoming appointment for further evaluation.  GENERAL HEALTH MAINTENANCE: You are due for a shingles vaccine. -Get the shingles vaccine at the pharmacy.                      Contains text generated by  [employer]bridge.                                 Contains text generated by [employer]bridge.

## 2023-11-16 LAB — CBC WITH DIFFERENTIAL/PLATELET
Basophils Absolute: 0.1 x10E3/uL (ref 0.0–0.2)
Basos: 1 %
EOS (ABSOLUTE): 0.2 x10E3/uL (ref 0.0–0.4)
Eos: 2 %
Hematocrit: 43.8 % (ref 34.0–46.6)
Hemoglobin: 14 g/dL (ref 11.1–15.9)
Immature Grans (Abs): 0 x10E3/uL (ref 0.0–0.1)
Immature Granulocytes: 0 %
Lymphocytes Absolute: 2 x10E3/uL (ref 0.7–3.1)
Lymphs: 25 %
MCH: 30.6 pg (ref 26.6–33.0)
MCHC: 32 g/dL (ref 31.5–35.7)
MCV: 96 fL (ref 79–97)
Monocytes Absolute: 0.8 x10E3/uL (ref 0.1–0.9)
Monocytes: 10 %
Neutrophils Absolute: 4.8 x10E3/uL (ref 1.4–7.0)
Neutrophils: 62 %
Platelets: 220 x10E3/uL (ref 150–450)
RBC: 4.57 x10E6/uL (ref 3.77–5.28)
RDW: 14.5 % (ref 11.7–15.4)
WBC: 7.8 x10E3/uL (ref 3.4–10.8)

## 2023-11-16 LAB — COMPREHENSIVE METABOLIC PANEL WITH GFR
ALT: 19 IU/L (ref 0–32)
AST: 27 IU/L (ref 0–40)
Albumin: 4.3 g/dL (ref 3.8–4.8)
Alkaline Phosphatase: 71 IU/L (ref 49–135)
BUN/Creatinine Ratio: 13 (ref 12–28)
BUN: 13 mg/dL (ref 8–27)
Bilirubin Total: 0.5 mg/dL (ref 0.0–1.2)
CO2: 25 mmol/L (ref 20–29)
Calcium: 9.6 mg/dL (ref 8.7–10.3)
Chloride: 105 mmol/L (ref 96–106)
Creatinine, Ser: 1.03 mg/dL — ABNORMAL HIGH (ref 0.57–1.00)
Globulin, Total: 2.2 g/dL (ref 1.5–4.5)
Glucose: 93 mg/dL (ref 70–99)
Potassium: 4.8 mmol/L (ref 3.5–5.2)
Sodium: 143 mmol/L (ref 134–144)
Total Protein: 6.5 g/dL (ref 6.0–8.5)
eGFR: 56 mL/min/1.73 — ABNORMAL LOW (ref >59)

## 2023-11-16 LAB — LIPID PANEL
Chol/HDL Ratio: 2.9 ratio (ref 0.0–4.4)
Cholesterol, Total: 163 mg/dL (ref 100–199)
HDL: 56 mg/dL (ref >39)
LDL Chol Calc (NIH): 77 mg/dL (ref 0–99)
Triglycerides: 180 mg/dL — ABNORMAL HIGH (ref 0–149)
VLDL Cholesterol Cal: 30 mg/dL (ref 5–40)

## 2023-11-17 ENCOUNTER — Other Ambulatory Visit: Payer: Self-pay | Admitting: Medical Genetics

## 2023-11-17 ENCOUNTER — Ambulatory Visit: Payer: Self-pay | Admitting: Family Medicine

## 2023-11-17 DIAGNOSIS — M19049 Primary osteoarthritis, unspecified hand: Secondary | ICD-10-CM | POA: Insufficient documentation

## 2023-11-17 DIAGNOSIS — N1831 Chronic kidney disease, stage 3a: Secondary | ICD-10-CM | POA: Insufficient documentation

## 2023-11-17 NOTE — Assessment & Plan Note (Signed)
 Occasional pain managed with Tylenol  and Voltaren as needed.

## 2023-11-17 NOTE — Assessment & Plan Note (Signed)
 Previous labs indicated some dysfunction. - Recheck kidney function tests.

## 2023-11-17 NOTE — Assessment & Plan Note (Signed)
 Improvement noted four months ago. Taking iron supplements regularly. - Recheck complete blood count.

## 2023-11-17 NOTE — Assessment & Plan Note (Signed)
 Positive Cologuard test with suspected lower GI source. Occasional rectal bleeding reported. - Upcoming appointment for further evaluation.

## 2023-11-18 ENCOUNTER — Telehealth: Payer: Self-pay

## 2023-11-18 ENCOUNTER — Ambulatory Visit: Admitting: Gastroenterology

## 2023-11-18 ENCOUNTER — Encounter: Payer: Self-pay | Admitting: Gastroenterology

## 2023-11-18 VITALS — BP 128/84 | HR 79 | Ht 64.0 in | Wt 191.1 lb

## 2023-11-18 DIAGNOSIS — R195 Other fecal abnormalities: Secondary | ICD-10-CM | POA: Diagnosis not present

## 2023-11-18 DIAGNOSIS — K5903 Drug induced constipation: Secondary | ICD-10-CM

## 2023-11-18 MED ORDER — SEMAGLUTIDE (2 MG/DOSE) 8 MG/3ML ~~LOC~~ SOPN: 2.0000 mg | PEN_INJECTOR | SUBCUTANEOUS | 0 refills | Status: DC

## 2023-11-18 NOTE — Patient Instructions (Addendum)
 Please call our office at [phone] if you decide to get the colonoscopy.  _______________________________________________________  If your blood pressure at your visit was 140/90 or greater, please contact your primary care physician to follow up on this.  _______________________________________________________  If you are age 78 or older, your body mass index should be between 23-30. Your Body mass index is 32.81 kg/m. If this is out of the aforementioned range listed, please consider follow up with your Primary Care Provider.  If you are age 4 or younger, your body mass index should be between 19-25. Your Body mass index is 32.81 kg/m. If this is out of the aformentioned range listed, please consider follow up with your Primary Care Provider.   ________________________________________________________  The [facility] providers would like to encourage you to use MYCHART to communicate with providers for non-urgent requests or questions.  Due to long hold times on the telephone, sending your provider a message by [facility] may be a faster and more efficient way to get a response.  Please allow 48 business hours for a response.  Please remember that this is for non-urgent requests.  _______________________________________________________  [facility] is using a team-based approach to care.  Your team is made up of your doctor and two to three APPS. Our APPS (Nurse Practitioners and Physician Assistants) work with your physician to ensure care continuity for you. They are fully qualified to address your health concerns and develop a treatment plan. They communicate directly with your gastroenterologist to care for you. Seeing the Advanced Practice Practitioners on your physician's team can help you by facilitating care more promptly, often allowing for earlier appointments, access to diagnostic testing, procedures, and other specialty referrals.

## 2023-11-18 NOTE — Telephone Encounter (Signed)
 Copied from CRM [id number]. Topic: Clinical - Prescription Issue >> Nov 18, 2023  9:23 AM Connie Marks wrote: Reason for CRM: Patient states the ozempic  was sent to the wrong pharmacy. This should be sent to the   [facility] [place], [place] - [address] [place] [address] Phone:[phone] Hours:Not open 24 hours   Patient callback is [phone] (home) If she does not answer, please leave VM

## 2023-11-18 NOTE — Progress Notes (Addendum)
 Chief Complaint:Positive colorectal cancer screening using Cologuard test  Primary GI Doctor: Dr. Suzann   HPI:  Patient is a  78  year old female patient with past medical history of GERD, DM, anxiety, and depression, who was referred to me by Sherre Clapper, MD on 08/13/23 for a evaluation of Positive colorectal cancer screening using Cologuard test.    Interval History    Patient presents to discuss evaluation of positive Cologuard.  Patient tells me she has never had a colonoscopy.  Patient has completed several Cologuard's that were negative until most recent. Patient had several questions about colonoscopy which I spent several minutes reviewing.   Patient has history of GERD and taking Lansoprazole  and famotidine .  Patient reports this and controls her reflux. Patient denies dysphagia. Patient denies nausea, vomiting, or weight loss.   Patient on baby ASA 81 mg po daily.   Patient reports since starting on Ozempic  she has had some straining and BM every 1-2 days. She does note with the straining she will see BRB.   Patient's family history : No family history of colon CA, no esophageal CA  Wt Readings from Last 3 Encounters:  11/18/23 191 lb 2 oz (86.7 kg)  11/15/23 193 lb (87.5 kg)  07/16/23 195 lb (88.5 kg)    Past Medical History:  Diagnosis Date   Allergy 07/1989   Foam tape welts   Anxiety    Arthritis 2009   Cataract 2022   Closed fracture of phalanx of foot 12/21/2019   Depression    Diabetes (HCC)    GERD (gastroesophageal reflux disease)    Hyperlipidemia    Prediabetes    Sepsis secondary to UTI (HCC) 11/27/2022   Sleep apnea    uses CPAP every night    Past Surgical History:  Procedure Laterality Date   CATARACT EXTRACTION, BILATERAL  2022   CESAREAN SECTION     DIAGNOSTIC LAPAROSCOPY     FEMUR FRACTURE SURGERY  1960   FLAT FOOT RECONSTRUCTION-TAL GASTROC RECESSION Right 09/25/2021   In [place], [place]   KNEE ARTHROPLASTY Bilateral    LIPOMA EXCISION      Dr Joesph   ORIF ORBITAL FRACTURE Right 12/19/2018   Procedure: OPEN TREATMENT RIGHT ORBITAL FLOOR WITH IMPLANT, PERIORBITAL APPROACH;  Surgeon: Arelia Filippo, MD;  Location: [facility];  Service: Plastics;  Laterality: Right;   ORIF ORBITAL FRACTURE Right 02/03/2019   Procedure: OPEN TREATMENT RIGHT ORBITAL FLOOR FRACTURE REVISION, PERI ORBITAL APPROACH;  Surgeon: Arelia Filippo, MD;  Location: [facility];  Service: Plastics;  Laterality: Right;   TOTAL HIP ARTHROPLASTY Bilateral    UMBILICAL HERNIA REPAIR      Current Outpatient Medications  Medication Sig Dispense Refill   aspirin EC 81 MG tablet Take 81 mg by mouth daily. Swallow whole.     buPROPion  (WELLBUTRIN  SR) 150 MG 12 hr tablet Take 1 tablet (150 mg total) by mouth 2 (two) times daily. 180 tablet 1   Calcium Carbonate+Vitamin D 600-200 MG-UNIT TABS Take 1 tablet by mouth daily.     Carboxymethylcellulose Sod PF (REFRESH PLUS) 0.5 % SOLN      citalopram  (CELEXA ) 20 MG tablet Take 1 tablet (20 mg total) by mouth daily. 90 tablet 0   famotidine  (PEPCID ) 40 MG tablet Take 40 mg by mouth at bedtime.     Fe Bisgly-Vit C-Vit B12-FA (GENTLE IRON) 28-60-0.008-0.4 MG CAPS Take 1 capsule by mouth daily.     lansoprazole  (PREVACID ) 30 MG capsule  Take 1 capsule (30 mg total) by mouth daily at 12 noon. 90 capsule 3   Multiple Vitamins-Minerals (MULTIVITAL PO) Take by mouth.     pramipexole  (MIRAPEX ) 0.5 MG tablet Take 1 tablet (0.5 mg total) by mouth at bedtime.     Semaglutide , 2 MG/DOSE, 8 MG/3ML SOPN Inject 2 mg as directed once a week. 9 mL 0   simvastatin  (ZOCOR ) 40 MG tablet TAKE ONE TABLET BY MOUTH ONCE DAILY 90 tablet 0   traZODone  (DESYREL ) 50 MG tablet TAKE 1/2 TO 1 TABLET BY MOUTH AT BEDTIME AS NEEDED FOR SLEEP 90 tablet 1   No current facility-administered medications for this visit.    Allergies as of 11/18/2023 - Review Complete 11/18/2023  Allergen Reaction Noted   Adhesive [tape]   01/26/2019    Family History  Problem Relation Age of Onset   Varicose Veins Mother    Hypertension Mother    Diabetes Father    COPD Father    Heart disease Father    Heart defect Sister    Hypertension Brother    Pancreatic cancer Maternal Grandmother    Arthritis Maternal Grandmother    Arthritis Paternal Grandmother    Hyperlipidemia Paternal Aunt    Breast cancer Neg Hx     Review of Systems:    Constitutional: No weight loss, fever, chills, weakness or fatigue HEENT: Eyes: No change in vision               Ears, Nose, Throat:  No change in hearing or congestion Skin: No rash or itching Cardiovascular: No chest pain, chest pressure or palpitations   Respiratory: No SOB or cough Gastrointestinal: See HPI and otherwise negative Genitourinary: No dysuria or change in urinary frequency Neurological: No headache, dizziness or syncope Musculoskeletal: No new muscle or joint pain Hematologic: No bleeding or bruising Psychiatric: No history of depression or anxiety    Physical Exam:  Vital signs: BP 128/84   Pulse 79   Ht 5' 4 (1.626 m) Comment: Height measured with shoes off.  Wt 191 lb 2 oz (86.7 kg)   BMI 32.81 kg/m   Constitutional:   Pleasant female appears to be in NAD, Well developed, Well nourished, alert and cooperative Throat: Oral cavity and pharynx without inflammation, swelling or lesion.  Respiratory: Respirations even and unlabored. Lungs clear to auscultation bilaterally.   No wheezes, crackles, or rhonchi.  Cardiovascular: Normal S1, S2. Regular rate and rhythm. No peripheral edema, cyanosis or pallor.  Gastrointestinal:  Soft, nondistended, nontender. No rebound or guarding. Normal bowel sounds. No appreciable masses or hepatomegaly. Rectal:  Not performed. Declined. Msk:  Symmetrical without gross deformities. Without edema, no deformity or joint abnormality.  Neurologic:  Alert and  oriented x4;  grossly normal neurologically.  Skin:   Dry and  intact without significant lesions or rashes.  RELEVANT LABS AND IMAGING: CBC    Latest Ref Rng & Units 11/15/2023    9:23 AM 07/16/2023    9:55 AM 04/11/2023    9:47 AM  CBC  WBC 3.4 - 10.8 x10E3/uL 7.8  7.8  9.0   Hemoglobin 11.1 - 15.9 g/dL 85.9  88.2  89.4   Hematocrit 34.0 - 46.6 % 43.8  36.3  32.8   Platelets 150 - 450 x10E3/uL 220  274  271      CMP     Latest Ref Rng & Units 11/15/2023    9:23 AM 07/16/2023    9:55 AM 04/11/2023    9:47 AM  CMP  Glucose 70 - 99 mg/dL 93  99  881   BUN 8 - 27 mg/dL 13  15  21    Creatinine 0.57 - 1.00 mg/dL 8.96  8.75  8.81   Sodium 134 - 144 mmol/L 143  141  142   Potassium 3.5 - 5.2 mmol/L 4.8  5.2  5.5   Chloride 96 - 106 mmol/L 105  104  105   CO2 20 - 29 mmol/L 25  19  20    Calcium 8.7 - 10.3 mg/dL 9.6  89.8  9.8   Total Protein 6.0 - 8.5 g/dL 6.5  6.9  6.8   Total Bilirubin 0.0 - 1.2 mg/dL 0.5  0.3  0.3   Alkaline Phos 49 - 135 IU/L 71  66  70   AST 0 - 40 IU/L 27  19  18    ALT 0 - 32 IU/L 19  16  15       Lab Results  Component Value Date   TSH 1.140 07/16/2023   08/01/23 cologuard positive  Assessment: Encounter Diagnoses  Name Primary?   Positive colorectal cancer screening using Cologuard test Yes   Drug induced constipation      Very healthy 78 year old female patient that presents for evaluation of positive Cologuard.  Patient has never had colonoscopy however has completed other Cologuard's that were negative.  Recommended the patient proceed with colon screening colonoscopy in [facility].  Patient was a little reluctant to do procedure however did answer all her questions and concerns and she stated she would discuss with her brother and sister-in-law he would need to provide a ride to the procedure from [place].  She will call the office to set up.     Patient also has had recent issues with constipation from Ozempic  recommended the patient take daily MiraLAX as well as take up to the colonoscopy to help with bowel prep.   Patient verbalizes understanding  Plan: -OTC miralax po daily  -hold ozempic  1 week prior to procedure -recommend colonoscopy with 2 day prep, will call office and set up after she speaks to ride.   Thank you for the courtesy of this consult. Please call me with any questions or concerns.   Keshana Klemz, FNP-C [facility] 11/18/2023, 11:41 AM  Cc: Sherre Clapper, MD  I have reviewed the clinic note as outlined by Cathryne Beal, NP and agree with the assessment, plan and medical decision making.  Ms. Donnel is referred to the office for evaluation of positive Cologuard.  No prior history of colonoscopy.  She has had negative Cologuard in the past.  Denies current symptoms of change in bowel habits or rectal bleeding.  Agree that it is appropriate to proceed with colonoscopy with 2-day bowel prep given GLP-1 medication use.  Inocente Hausen, MD

## 2023-11-28 NOTE — Progress Notes (Signed)
   11/28/2023  Patient ID: Connie Marks, female   DOB: 08/24/1945, 78 y.o.   MRN: [id number]  Pharmacy Quality Measure Review  This patient is appearing on a report for being at risk of failing the adherence measure for diabetes medications this calendar year.   Medication: semaglutide  Last fill date: 11/18/23 for 84 day supply  Insurance report was not up to date. No action needed at this time.   Lang Sieve, PharmD, BCGP Clinical Pharmacist  [phone]

## 2023-11-30 ENCOUNTER — Other Ambulatory Visit: Payer: Self-pay | Admitting: Family Medicine

## 2023-11-30 DIAGNOSIS — F330 Major depressive disorder, recurrent, mild: Secondary | ICD-10-CM

## 2023-12-10 ENCOUNTER — Other Ambulatory Visit: Payer: Self-pay | Admitting: Medical Genetics

## 2023-12-10 DIAGNOSIS — Z006 Encounter for examination for normal comparison and control in clinical research program: Secondary | ICD-10-CM

## 2023-12-24 ENCOUNTER — Ambulatory Visit: Admitting: Podiatry

## 2023-12-24 ENCOUNTER — Encounter: Payer: Self-pay | Admitting: Podiatry

## 2023-12-24 DIAGNOSIS — E1140 Type 2 diabetes mellitus with diabetic neuropathy, unspecified: Secondary | ICD-10-CM

## 2023-12-24 DIAGNOSIS — M79675 Pain in left toe(s): Secondary | ICD-10-CM | POA: Diagnosis not present

## 2023-12-24 DIAGNOSIS — M79674 Pain in right toe(s): Secondary | ICD-10-CM

## 2023-12-24 DIAGNOSIS — L84 Corns and callosities: Secondary | ICD-10-CM

## 2023-12-24 DIAGNOSIS — B351 Tinea unguium: Secondary | ICD-10-CM

## 2023-12-24 LAB — GENECONNECT MOLECULAR SCREEN: Genetic Analysis Overall Interpretation: NEGATIVE

## 2023-12-24 NOTE — Patient Instructions (Signed)
 Look for urea 40% cream or ointment and apply to the thickened dry skin / calluses. This can be bought over the counter, at a pharmacy or online such as [employer].

## 2023-12-24 NOTE — Progress Notes (Unsigned)
  Subjective:  Patient ID: Connie Marks, female    DOB: 06-12-1945,  MRN: [id number]  Chief Complaint  Patient presents with   Nail Problem    Thick painful toenails, 3 month follow up    Diabetes    A1C  5.8    78 y.o. female presents with the above complaint. History confirmed with patient. Patient presenting with pain related to dystrophic thickened elongated nails. Patient is unable to trim own nails related to nail dystrophy. Patient does have a history of T2DM.  Her diabetes is well-controlled.  States her A1c is 5.8.  Does endorse neuropathy with paresthesias.  She reports doing well from previous flexor tenotomy procedures.  She does have some painful calluses distal aspect of first toes bilaterally and some diffuse callus to the plantar right heel greater than left.  Objective:  Physical Exam: warm, good capillary refill, decreased pedal hair growth, pedal skin atrophic. nail exam onychomycosis of the toenails, onycholysis, and dystrophic nails greater than 3 mm thickening DP pulses palpable, PT pulses palpable, protective sensation absent, and vibratory sensation diminished, subjective paresthesias.  Stocking glove distribution. Left Foot:  Pain with palpation of nails due to elongation and dystrophic growth.  Right Foot: Pain with palpation of nails due to elongation and dystrophic growth.  Some painful callus present distal medial aspect of bilateral first toes as well as diffuse callus right plantar heel greater than left.  Assessment:   1. Pain due to onychomycosis of toenails of both feet   2. Controlled type 2 diabetes with neuropathy (HCC)   3. Callus      Plan:  Patient was evaluated and treated and all questions answered.  #Hyperkeratotic lesions/pre ulcerative calluses present bilateral first toe distal tuft and diffuse right heel All symptomatic hyperkeratoses x 2 to the first toes separate lesions were safely debrided as a courtesy with a sterile #10 blade  to patient's level of comfort without incident.  Did lightly pare down right heel callus as well and burred callus smooth.  We discussed preventative and palliative care of these lesions including supportive and accommodative shoegear, padding, prefabricated and custom molded accommodative orthoses, use of a pumice stone and lotions/creams daily.  #Onychomycosis with pain  -Nails palliatively debrided as below. -Educated on self-care - Diabetes with neuropathy  Procedure: Nail Debridement Rationale: Pain Type of Debridement: manual, sharp debridement. Instrumentation: Nail nipper, rotary burr. Number of Nails: 10  # Diabetes with neuropathy Patient educated on diabetes. Discussed proper diabetic foot care and discussed risks and complications of disease. Educated patient in depth on reasons to return to the office immediately should he/she discover anything concerning or new on the feet. All questions answered. Discussed proper shoes as well.    Return in about 3 months (around 03/25/2024) for Diabetic Foot Care.         Ethan Saddler, DPM [facility] / [facility]

## 2024-01-07 ENCOUNTER — Other Ambulatory Visit: Payer: Self-pay | Admitting: Family Medicine

## 2024-01-07 DIAGNOSIS — G2581 Restless legs syndrome: Secondary | ICD-10-CM

## 2024-02-10 ENCOUNTER — Other Ambulatory Visit: Payer: Self-pay | Admitting: Family Medicine

## 2024-02-10 DIAGNOSIS — E782 Mixed hyperlipidemia: Secondary | ICD-10-CM

## 2024-02-12 ENCOUNTER — Telehealth: Payer: Self-pay | Admitting: Family Medicine

## 2024-02-12 NOTE — Telephone Encounter (Signed)
 [facility] Letter of Medical Necessity

## 2024-02-22 ENCOUNTER — Other Ambulatory Visit: Payer: Self-pay | Admitting: Family Medicine

## 2024-02-24 ENCOUNTER — Other Ambulatory Visit: Payer: Self-pay | Admitting: Family Medicine

## 2024-02-24 ENCOUNTER — Telehealth: Payer: Self-pay | Admitting: Family Medicine

## 2024-02-24 DIAGNOSIS — F330 Major depressive disorder, recurrent, mild: Secondary | ICD-10-CM

## 2024-02-24 NOTE — Telephone Encounter (Signed)
 [facility] CPAP Order

## 2024-03-17 ENCOUNTER — Ambulatory Visit: Admitting: Family Medicine

## 2024-03-17 DIAGNOSIS — E782 Mixed hyperlipidemia: Secondary | ICD-10-CM

## 2024-03-17 DIAGNOSIS — I152 Hypertension secondary to endocrine disorders: Secondary | ICD-10-CM

## 2024-03-24 ENCOUNTER — Ambulatory Visit: Admitting: Podiatry
